# Patient Record
Sex: Female | Born: 1960 | Race: White | Hispanic: No | Marital: Married | Smoking: Former smoker
Health system: Southern US, Community
[De-identification: ages and names within clinical notes are randomized; demographics above are authoritative.]

## PROBLEM LIST (undated history)

## (undated) DIAGNOSIS — M545 Low back pain, unspecified: Secondary | ICD-10-CM

## (undated) DIAGNOSIS — M1990 Unspecified osteoarthritis, unspecified site: Secondary | ICD-10-CM

## (undated) DIAGNOSIS — F329 Major depressive disorder, single episode, unspecified: Secondary | ICD-10-CM

## (undated) DIAGNOSIS — Z9889 Other specified postprocedural states: Secondary | ICD-10-CM

## (undated) DIAGNOSIS — M797 Fibromyalgia: Secondary | ICD-10-CM

## (undated) DIAGNOSIS — K760 Fatty (change of) liver, not elsewhere classified: Secondary | ICD-10-CM

## (undated) DIAGNOSIS — R112 Nausea with vomiting, unspecified: Secondary | ICD-10-CM

## (undated) DIAGNOSIS — G5600 Carpal tunnel syndrome, unspecified upper limb: Secondary | ICD-10-CM

## (undated) DIAGNOSIS — M2550 Pain in unspecified joint: Secondary | ICD-10-CM

## (undated) DIAGNOSIS — F32A Depression, unspecified: Secondary | ICD-10-CM

## (undated) DIAGNOSIS — K219 Gastro-esophageal reflux disease without esophagitis: Secondary | ICD-10-CM

## (undated) HISTORY — PX: SHOULDER SURGERY: SHX246

## (undated) HISTORY — DX: Low back pain, unspecified: M54.50

## (undated) HISTORY — DX: Unspecified osteoarthritis, unspecified site: M19.90

## (undated) HISTORY — DX: Pain in unspecified joint: M25.50

## (undated) HISTORY — DX: Fatty (change of) liver, not elsewhere classified: K76.0

## (undated) HISTORY — DX: Gastro-esophageal reflux disease without esophagitis: K21.9

## (undated) HISTORY — DX: Carpal tunnel syndrome, unspecified upper limb: G56.00

---

## 1983-12-09 HISTORY — PX: TUBAL LIGATION: SHX77

## 2002-12-08 HISTORY — PX: BUNIONECTOMY: SHX129

## 2005-11-10 ENCOUNTER — Encounter: Admission: RE | Admit: 2005-11-10 | Discharge: 2005-11-10 | Payer: Self-pay | Admitting: Rheumatology

## 2008-05-19 ENCOUNTER — Encounter: Admission: RE | Admit: 2008-05-19 | Discharge: 2008-05-19 | Payer: Self-pay | Admitting: Surgery

## 2009-07-01 ENCOUNTER — Emergency Department (HOSPITAL_BASED_OUTPATIENT_CLINIC_OR_DEPARTMENT_OTHER): Admission: EM | Admit: 2009-07-01 | Discharge: 2009-07-01 | Payer: Self-pay | Admitting: Emergency Medicine

## 2010-12-08 HISTORY — PX: TARSAL TUNNEL RELEASE: SUR1099

## 2011-03-16 LAB — URINALYSIS, ROUTINE W REFLEX MICROSCOPIC
Bilirubin Urine: NEGATIVE
Glucose, UA: NEGATIVE mg/dL
Ketones, ur: NEGATIVE mg/dL
Nitrite: NEGATIVE
Protein, ur: NEGATIVE mg/dL
Specific Gravity, Urine: 1.008 (ref 1.005–1.030)
Urobilinogen, UA: 0.2 mg/dL (ref 0.0–1.0)
pH: 5.5 (ref 5.0–8.0)

## 2011-03-16 LAB — URINE CULTURE: Colony Count: >=100000

## 2011-03-16 LAB — URINE MICROSCOPIC-ADD ON

## 2011-03-16 LAB — PREGNANCY, URINE: Preg Test, Ur: NEGATIVE

## 2011-11-06 ENCOUNTER — Other Ambulatory Visit (HOSPITAL_BASED_OUTPATIENT_CLINIC_OR_DEPARTMENT_OTHER): Payer: Self-pay | Admitting: Family Medicine

## 2011-11-06 DIAGNOSIS — R109 Unspecified abdominal pain: Secondary | ICD-10-CM

## 2011-11-07 ENCOUNTER — Other Ambulatory Visit (HOSPITAL_BASED_OUTPATIENT_CLINIC_OR_DEPARTMENT_OTHER): Payer: Self-pay

## 2011-11-07 ENCOUNTER — Inpatient Hospital Stay (HOSPITAL_COMMUNITY): Admission: RE | Admit: 2011-11-07 | Payer: Self-pay | Source: Ambulatory Visit

## 2011-11-12 ENCOUNTER — Ambulatory Visit (HOSPITAL_COMMUNITY)
Admission: RE | Admit: 2011-11-12 | Discharge: 2011-11-12 | Disposition: A | Discharge status: Home / Self Care | Payer: 59 | Source: Ambulatory Visit | Attending: Family Medicine | Admitting: Family Medicine

## 2011-11-12 DIAGNOSIS — K7689 Other specified diseases of liver: Secondary | ICD-10-CM | POA: Insufficient documentation

## 2011-11-12 DIAGNOSIS — R109 Unspecified abdominal pain: Secondary | ICD-10-CM | POA: Insufficient documentation

## 2013-07-26 ENCOUNTER — Other Ambulatory Visit (HOSPITAL_BASED_OUTPATIENT_CLINIC_OR_DEPARTMENT_OTHER): Payer: Self-pay | Admitting: Family Medicine

## 2013-07-26 DIAGNOSIS — M545 Low back pain, unspecified: Secondary | ICD-10-CM

## 2013-07-30 ENCOUNTER — Other Ambulatory Visit (HOSPITAL_BASED_OUTPATIENT_CLINIC_OR_DEPARTMENT_OTHER): Payer: 59

## 2013-08-06 ENCOUNTER — Ambulatory Visit (HOSPITAL_BASED_OUTPATIENT_CLINIC_OR_DEPARTMENT_OTHER): Payer: 59

## 2013-08-09 ENCOUNTER — Ambulatory Visit (HOSPITAL_BASED_OUTPATIENT_CLINIC_OR_DEPARTMENT_OTHER)
Admission: RE | Admit: 2013-08-09 | Discharge: 2013-08-09 | Disposition: A | Discharge status: Home / Self Care | Payer: 59 | Source: Ambulatory Visit | Attending: Family Medicine | Admitting: Family Medicine

## 2013-08-09 ENCOUNTER — Other Ambulatory Visit (HOSPITAL_BASED_OUTPATIENT_CLINIC_OR_DEPARTMENT_OTHER): Payer: 59

## 2013-08-09 DIAGNOSIS — M899 Disorder of bone, unspecified: Secondary | ICD-10-CM | POA: Insufficient documentation

## 2013-08-09 DIAGNOSIS — IMO0002 Reserved for concepts with insufficient information to code with codable children: Secondary | ICD-10-CM | POA: Insufficient documentation

## 2013-08-09 DIAGNOSIS — M5137 Other intervertebral disc degeneration, lumbosacral region: Secondary | ICD-10-CM | POA: Insufficient documentation

## 2013-08-09 DIAGNOSIS — G8929 Other chronic pain: Secondary | ICD-10-CM | POA: Insufficient documentation

## 2013-08-09 DIAGNOSIS — M545 Low back pain, unspecified: Secondary | ICD-10-CM | POA: Insufficient documentation

## 2013-08-09 DIAGNOSIS — M51379 Other intervertebral disc degeneration, lumbosacral region without mention of lumbar back pain or lower extremity pain: Secondary | ICD-10-CM | POA: Insufficient documentation

## 2013-08-09 DIAGNOSIS — M5146 Schmorl's nodes, lumbar region: Secondary | ICD-10-CM | POA: Insufficient documentation

## 2013-08-09 DIAGNOSIS — M5126 Other intervertebral disc displacement, lumbar region: Secondary | ICD-10-CM | POA: Insufficient documentation

## 2013-10-05 ENCOUNTER — Other Ambulatory Visit (HOSPITAL_COMMUNITY): Payer: Self-pay | Admitting: Family Medicine

## 2013-10-05 DIAGNOSIS — Z1231 Encounter for screening mammogram for malignant neoplasm of breast: Secondary | ICD-10-CM

## 2013-10-25 ENCOUNTER — Ambulatory Visit (HOSPITAL_COMMUNITY): Payer: 59

## 2013-11-22 ENCOUNTER — Ambulatory Visit (HOSPITAL_COMMUNITY): Payer: 59

## 2013-12-13 ENCOUNTER — Ambulatory Visit (HOSPITAL_COMMUNITY)
Admission: RE | Admit: 2013-12-13 | Discharge: 2013-12-13 | Disposition: A | Discharge status: Home / Self Care | Payer: 59 | Source: Ambulatory Visit | Attending: Family Medicine | Admitting: Family Medicine

## 2013-12-13 DIAGNOSIS — Z1231 Encounter for screening mammogram for malignant neoplasm of breast: Secondary | ICD-10-CM | POA: Insufficient documentation

## 2014-02-21 ENCOUNTER — Other Ambulatory Visit (HOSPITAL_BASED_OUTPATIENT_CLINIC_OR_DEPARTMENT_OTHER): Payer: Self-pay | Admitting: Neurosurgery

## 2014-02-21 DIAGNOSIS — M549 Dorsalgia, unspecified: Secondary | ICD-10-CM

## 2014-02-25 ENCOUNTER — Ambulatory Visit (HOSPITAL_BASED_OUTPATIENT_CLINIC_OR_DEPARTMENT_OTHER)
Admission: RE | Admit: 2014-02-25 | Discharge: 2014-02-25 | Disposition: A | Discharge status: Home / Self Care | Payer: 59 | Source: Ambulatory Visit | Attending: Neurosurgery | Admitting: Neurosurgery

## 2014-02-25 DIAGNOSIS — M549 Dorsalgia, unspecified: Secondary | ICD-10-CM | POA: Insufficient documentation

## 2014-03-27 ENCOUNTER — Ambulatory Visit: Payer: Self-pay

## 2014-04-14 ENCOUNTER — Ambulatory Visit (INDEPENDENT_AMBULATORY_CARE_PROVIDER_SITE_OTHER): Payer: 59

## 2014-04-14 VITALS — BP 112/70 | HR 53 | Resp 16 | Ht 65.0 in | Wt 190.0 lb

## 2014-04-14 DIAGNOSIS — D492 Neoplasm of unspecified behavior of bone, soft tissue, and skin: Secondary | ICD-10-CM

## 2014-04-14 DIAGNOSIS — B070 Plantar wart: Secondary | ICD-10-CM

## 2014-04-14 DIAGNOSIS — M79609 Pain in unspecified limb: Secondary | ICD-10-CM

## 2014-04-14 NOTE — Patient Instructions (Signed)

## 2014-04-14 NOTE — Progress Notes (Signed)
   Subjective:    Patient ID: Priscilla Underwood, female    DOB: 1961/01/06, 53 y.o.   MRN: [id number]  HPI Comments: "I have this wart that I want removed"  Patient c/o painful callused area plantar forefoot for about 1 year. Dr. Blenda Mounts at last visit recommended salicylic acid to treat. Tried but no help.   Pathology specimen sent to [facility]  Foot Pain      Review of Systems  Musculoskeletal: Positive for gait problem.  All other systems reviewed and are negative.      Objective:   Physical Exam Neurovascular status is intact pedal pulses palpable epicritic and proprioceptive sensations intact and symmetric bilateral is normal plantar response and DTRs. Dermatologically his [place] keratotic lesion subsecond MTP area of the right foot painful tender symptomatic is also keratoses fifth digit MTP area left foot although this is more diffuse tyloma. No other new problems or difficulties pedal pulses are palpable epicritic and proprioceptive sensations unremarkable at this time patient K. for progressive options per her request my recommendation excision curettage this lesion and biopsy for pathology or evaluation. At this time local anesthetic block is administered total of 2 cc 50-50 mixture of 2% Xylocaine with epinephrine 1 100,000 and 0.5% Marcaine plain is infiltrated Betadine prep performed the lesion is circumscribing thready from the side and submitted for formalin for pathology analysis. At this time the bases phenolized with single application of phenyl 5 alcohol wash Betadine ointment and a gauze dressing being applied. Initiate daily cleansing with antibacterial soap and warm water or Betadine warm water Neosporin and Band-Aid dressing. Recommended Tylenol or Advil as needed for pain       Assessment & Plan:  Assessment verruca plantaris plantar right foot subsecond MTP area excision for biopsy is carried out at this time we'll initiate daily cleansing with Betadine warm water  Neosporin and Band-Aid dressing Tylenol as needed for pain recheck in 2 weeks for an followup and reevaluation next  Harriet Masson DPM

## 2014-04-17 ENCOUNTER — Telehealth: Payer: Self-pay | Admitting: *Deleted

## 2014-04-17 NOTE — Telephone Encounter (Signed)
Skin Wedge 2nd MTP right foot sent to [facility] to rule out Verruca.  [facility] [address] [place] [address] Ph:  [phone]

## 2014-05-19 ENCOUNTER — Ambulatory Visit (INDEPENDENT_AMBULATORY_CARE_PROVIDER_SITE_OTHER): Payer: 59

## 2014-05-19 VITALS — BP 125/72 | HR 52 | Resp 14 | Ht 65.0 in | Wt 192.0 lb

## 2014-05-19 DIAGNOSIS — Q828 Other specified congenital malformations of skin: Secondary | ICD-10-CM

## 2014-05-19 DIAGNOSIS — B070 Plantar wart: Secondary | ICD-10-CM

## 2014-05-19 DIAGNOSIS — M79609 Pain in unspecified limb: Secondary | ICD-10-CM

## 2014-05-19 NOTE — Patient Instructions (Signed)
Corns and Calluses A thickening of the skin layer (usually over bony areas, such as toe joints) is known as a corn. Two types of corns exist: hard corns and soft corns. Calluses are painless areas of skin thickening that are caused by repeated pressure or irritation. Corns tend to affect toe joints and the skin between the toes; whereas, a callus can appear on any part of the body (especially the hands, feet, or knees).  SYMPTOMS   Corn:  Presence of a small (1/8 to 3/8 inch [3 to 10 mm in diameter]), painful bump on the side or over the joint of a toe.  Hard corns are more common on the outer portion of the little (fifth) toe at the joint.  Soft corns are more common between bony bumps (prominences), usually between the fourth and fifth toes or between the second and third toes.  Callus:  A rough, thickened area of skin that appears after repeated pressure or irritation. CAUSES  The purpose of corns and calluses is to protect an area of skin from injury caused by repeated irritation (rubbing or squeezing). The presence of pressure causes the skin cells to grow at a faster rate than the cells of unaffected areas. This leads to an overgrowth (corn or callus). As apposed to hard corns, soft corns tend to develop between toes, because there is more moisture. Soft corns are often the result of prolonged shoe wear, which leads to increased perspiration and moisture.  RISK INCREASES WITH:  Shoes that are too tight.  Occupations or sports that involve repetitive pressure on the hands (racquetball and baseball) or sudden stops on hard surfaces (track and tennis).  Sports that require the athlete to wear shoes, perspire, or wear clothing or protective gear that causes the production of heat and friction. PREVENTION  Properly fitted shoes and equipment.  Modify activities to prevent constant pressure on specific areas of skin.  If possible, wear padding over areas of skin that are exposed to  repeated pressure or irritation.  Keep the area between the toes dry (with powder or by removing shoes often).  Relieve shoe pressure by stretching the areas of the shoe that cause the pressure and or use ointments to soften leather shoes. PROGNOSIS  Corns and calluses typically subside if the activity that causes them is eliminated. Recovery may take up to 3 weeks. Recurrence is likely even with treatment if the cause is not removed.  RELATED COMPLICATIONS  If one overcompensates in an attempt to avoid pain, he or she may experience pain in other areas due to the changes in body movements (mechanics). TREATMENT  The best way to treat corns and calluses is to remove the source of pressure. Corn and callus pads may be helpful in reducing pressure on the affected skin. For soft corns, try to keep the affected area dry. If you cannot find shoes that fit properly, a shoe repair shop may be able to alter your shoes to reduce pressure. Occasionally a cushion for the bottom of the foot (metatarsal bar) worn within the shoe may relieve pressure on corns or calluses of the foot. For calluses, you may be able to peel or rub the thickened area with a pumice stone, sandstone, callus file, or with sandpaper to remove the callus; wetting the affected area may make this process more effective. Do not cut the corn or callus with a razor or knife. If the corn or callus must be removed, then a medically trained person should perform   the procedure. After peeling away the upper layers of a corn once or twice a day, it may be recommended to apply a non-prescription 5% to 10% salicylic ointment and cover the area with a bandage. It very uncommon to have the bony bumps (at toe joints) surgically removed. MEDICATION   If pain medication is necessary, nonsteroidal anti-inflammatory medications, such as aspirin and ibuprofen, or other minor pain relievers, such as acetaminophen, are often recommended. Contact your caregiver  immediately if any bleeding, stomach upset, or signs of an allergic reaction occur.  Topical salicylic ointments (5% to 10%) may be of benefit.  Prescription pain medications may be given by a caregiver. Use only as directed and only as much as you need.  Soak the foot for 20 minutes, twice a day, in a gallon of warm water. This may help to soften corns and calluses. Care should be taken to thoroughly dry the foot, especially between the toes, after soaking. SEEK MEDICAL CARE IF:   Symptoms get worse or do not improve in 2 weeks despite treatment.  Any signs of infection develop, including redness, swelling, increased pain or tenderness, or increased warmth around the corn or callus.  New, unexplained symptoms develop (drugs used in treatment may produce side effects). Document Released: 11/24/2005 Document Revised: 02/16/2012 Document Reviewed: 03/08/2009 [employer] Patient Information 2014 [employer].  

## 2014-05-19 NOTE — Progress Notes (Signed)
   Subjective:    Patient ID: Priscilla Underwood, female    DOB: Feb 03, 1961, 53 y.o.   MRN: [id number]  HPI Comments: Pt states she feels the right foot surgical site is better, the left 5th MPJ lateral is still sore and she feels it may have a wart as well.  I did see a few black speckles in the right plantar 2nd MPJ callous area.     Review of Systems no new findings of systems changes     Objective:   Physical Exam Patient is proxy 1 month status post wart excision subsecond MTP area of right foot and painful and symptomatic and she drainage she soak it for about 2 weeks since that time there is an eschar and keratoses which is still in place this time a debridement way the eschar a keratoses the wound is nearly completely healed although they're still about 2 mm Or a small fissure suggested continue Neosporin and Band-Aid for couple more days to complete resolution of the recent still painful she has been walking on the scar currently eschar tissue creating irritation to the site this was posterior been debrided off after 2 weeks but was not. At this time debris the eschar tissue and keratoses also examined the lateral fifth MTP area of the left foot where there is a keratotic lesion posse associated irritation to do a bony prominence there is no sign of verruca on the left foot porokeratosis is debrided away and patient is advised using lotion or cream and a pumice stone on a regular basis the future       Assessment & Plan:  Assessment this time is resolved are healing right foot status post curettage excision of verruca of that wound is healing the eschar tissue is debrided away patient also has a new lesion on the lateral fifth MTP area left foot which is examined noted to be poor keratoses lesions debrided however patient is given instruction in the skin care corn callus utilizing pumice stone and lotion a daily basis reappointed if needed if problems recur or persist beyond  month.  Harriet Masson DPM

## 2014-06-23 ENCOUNTER — Ambulatory Visit: Payer: 59

## 2014-07-05 ENCOUNTER — Ambulatory Visit: Payer: 59

## 2014-12-11 ENCOUNTER — Other Ambulatory Visit (HOSPITAL_COMMUNITY): Payer: Self-pay | Admitting: Family Medicine

## 2014-12-11 DIAGNOSIS — Z1231 Encounter for screening mammogram for malignant neoplasm of breast: Secondary | ICD-10-CM

## 2014-12-19 ENCOUNTER — Ambulatory Visit: Payer: 59

## 2014-12-29 ENCOUNTER — Ambulatory Visit (HOSPITAL_COMMUNITY): Payer: 59

## 2015-01-09 ENCOUNTER — Ambulatory Visit (HOSPITAL_COMMUNITY)
Admission: RE | Admit: 2015-01-09 | Discharge: 2015-01-09 | Disposition: A | Discharge status: Home / Self Care | Payer: 59 | Source: Ambulatory Visit | Attending: Family Medicine | Admitting: Family Medicine

## 2015-01-09 DIAGNOSIS — Z1231 Encounter for screening mammogram for malignant neoplasm of breast: Secondary | ICD-10-CM | POA: Diagnosis not present

## 2015-01-30 ENCOUNTER — Ambulatory Visit (INDEPENDENT_AMBULATORY_CARE_PROVIDER_SITE_OTHER): Payer: 59

## 2015-01-30 ENCOUNTER — Ambulatory Visit: Payer: Self-pay

## 2015-01-30 VITALS — BP 127/81 | HR 52 | Resp 16

## 2015-01-30 DIAGNOSIS — M7752 Other enthesopathy of left foot: Secondary | ICD-10-CM

## 2015-01-30 DIAGNOSIS — Q828 Other specified congenital malformations of skin: Secondary | ICD-10-CM

## 2015-01-30 DIAGNOSIS — M79673 Pain in unspecified foot: Secondary | ICD-10-CM

## 2015-01-30 DIAGNOSIS — B070 Plantar wart: Secondary | ICD-10-CM

## 2015-01-30 DIAGNOSIS — M159 Polyosteoarthritis, unspecified: Secondary | ICD-10-CM

## 2015-01-30 DIAGNOSIS — M778 Other enthesopathies, not elsewhere classified: Secondary | ICD-10-CM

## 2015-01-30 DIAGNOSIS — M779 Enthesopathy, unspecified: Secondary | ICD-10-CM

## 2015-01-30 DIAGNOSIS — R52 Pain, unspecified: Secondary | ICD-10-CM

## 2015-01-30 NOTE — Patient Instructions (Signed)
Corns and Calluses Corns are small areas of thickened skin that usually occur on the top, sides, or tip of a toe. They contain a cone-shaped core with a point that can press on a nerve below. This causes pain. Calluses are areas of thickened skin that usually develop on hands, fingers, palms, soles of the feet, and heels. These are areas that experience frequent friction or pressure. CAUSES  Corns are usually the result of rubbing (friction) or pressure from shoes that are too tight or do not fit properly. Calluses are caused by repeated friction and pressure on the affected areas. SYMPTOMS  A hard growth on the skin.  Pain or tenderness under the skin.  Sometimes, redness and swelling.  Increased discomfort while wearing tight-fitting shoes. DIAGNOSIS  Your caregiver can usually tell what the problem is by doing a physical exam. TREATMENT  Removing the cause of the friction or pressure is usually the only treatment needed. However, sometimes medicines can be used to help soften the hardened, thickened areas. These medicines include salicylic acid plasters and 12% ammonium lactate lotion. These medicines should only be used under the direction of your caregiver. HOME CARE INSTRUCTIONS   Try to remove pressure from the affected area.  You may wear donut-shaped corn pads to protect your skin.  You may use a pumice stone or nonmetallic nail file to gently reduce the thickness of a corn.  Wear properly fitted footwear.  If you have calluses on the hands, wear gloves during activities that cause friction.  If you have diabetes, you should regularly examine your feet. Tell your caregiver if you notice any problems with your feet. SEEK IMMEDIATE MEDICAL CARE IF:   You have increased pain, swelling, redness, or warmth in the affected area.  Your corn or callus starts to drain fluid or bleeds.  You are not getting better, even with treatment. Document Released: 08/30/2004 Document  Revised: 02/16/2012 Document Reviewed: 07/22/2011 [facility] Patient Information 2015 [place], [place]. This information is not intended to replace advice given to you by your health care provider. Make sure you discuss any questions you have with your health care provider.      Consider using Eucerin cream or generic for Eucerin which can be purchased at [employer].  Recommend replacing athletic shoes every 6-12 months  Consider using a cream or lotion with a urea base

## 2015-01-30 NOTE — Progress Notes (Signed)
   Subjective:    Patient ID: Priscilla Underwood, female    DOB: Nov 29, 1961, 54 y.o.   MRN: [id number]  HPI Pt presents with painful right foot callus/wart, and left foot pain on dorsal side, achey and swelling in left foot   Review of Systems no new findings or systemic changes noted     Objective:   Physical Exam Vascular status is intact pedal pulses are palpable epicritic and proprioceptive sensations intact patient has a keratotic lesion or hypertrophic scar poor keratoses subsecond MTP area right knees debriding at this time. We'll located circumscribed however does not appear to be verrucoid in nature. The other complaint at this time his new patient is having pain over the dorsum of the left foot mid foot Lisfranc's area this may correlate with previous arthropathy of Lisfranc's joint and subluxation Lisfranc's fourth fifth metatarsal base and cuboid. X-rays demonstrate some subluxation Lisfranc's joint patient is also history of fibromyalgia and mild osteoarthropathy. Arthritis was exacerbated by starting walking on a treadmill however wore a pair of  Athletic shoes that are more than 56 years old. Again pedal pulses are palpable no other new changes or findings noted      Assessment & Plan:  Assessment right foot or keratoses hypertrophic scar tissue plantar subsecond MTP R Wright the lesion is debrided recommended urea cream at this time lumicain and Neosporin applied following debridement. Stone urea cream regularly.  On the left foot x-rays confirm no fracture subluxation Lisfranc's noted cannot rule out Oster arthropathy her for rheumatologic chronic pain-like symptomology however may is just develops generalized osteoarthropathy capsulitis secondary to lack of support are worn out athletic shoe on use on the treadmill. Reappoint on an as-needed basis for future follow-up if there is any further exacerbation or failure to improve  Harriet Masson DPM

## 2015-05-03 ENCOUNTER — Other Ambulatory Visit: Payer: Self-pay | Admitting: Orthopedic Surgery

## 2015-05-18 ENCOUNTER — Encounter: Payer: Self-pay | Admitting: Podiatry

## 2015-05-18 ENCOUNTER — Ambulatory Visit (INDEPENDENT_AMBULATORY_CARE_PROVIDER_SITE_OTHER): Payer: 59

## 2015-05-18 ENCOUNTER — Ambulatory Visit (INDEPENDENT_AMBULATORY_CARE_PROVIDER_SITE_OTHER): Payer: 59 | Admitting: Podiatry

## 2015-05-18 DIAGNOSIS — M779 Enthesopathy, unspecified: Secondary | ICD-10-CM

## 2015-05-18 DIAGNOSIS — R52 Pain, unspecified: Secondary | ICD-10-CM

## 2015-05-18 DIAGNOSIS — M778 Other enthesopathies, not elsewhere classified: Secondary | ICD-10-CM

## 2015-05-18 DIAGNOSIS — B070 Plantar wart: Secondary | ICD-10-CM | POA: Diagnosis not present

## 2015-05-18 DIAGNOSIS — M7752 Other enthesopathy of left foot: Secondary | ICD-10-CM

## 2015-05-21 NOTE — Progress Notes (Signed)
Subjective:     Patient ID: Priscilla Underwood, female   DOB: [birth year], 54 y.o.   MRN: [id number]  HPI patient presents stating I have a lot of pain underneath my left foot with these lesions and I'm having trouble walking on them   Review of Systems     Objective:   Physical Exam Neurovascular status intact muscle strength adequate with keratotic lesions plantar left that upon debridement show pinpoint bleeding and pain to lateral pressure    Assessment:     Verruca plantaris plantar left along with probable inflammation and fluid buildup around the second MPJ with capsulitis    Plan:     H&P and both conditions reviewed. Debridement accomplished today and I did apply a chemical agent to the area to try to kill any verruca material and applied padding. Gave instructions what to do at home and reappoint to recheck and discussed rigid bottom shoes for the inflammatory capsulitis

## 2015-06-08 ENCOUNTER — Encounter (HOSPITAL_BASED_OUTPATIENT_CLINIC_OR_DEPARTMENT_OTHER): Payer: Self-pay | Admitting: *Deleted

## 2015-06-15 ENCOUNTER — Ambulatory Visit (HOSPITAL_BASED_OUTPATIENT_CLINIC_OR_DEPARTMENT_OTHER): Payer: 59 | Admitting: Certified Registered"

## 2015-06-15 ENCOUNTER — Encounter (HOSPITAL_BASED_OUTPATIENT_CLINIC_OR_DEPARTMENT_OTHER)
Admission: RE | Disposition: A | Discharge status: Home / Self Care | Payer: Self-pay | Source: Ambulatory Visit | Attending: Orthopedic Surgery

## 2015-06-15 ENCOUNTER — Encounter (HOSPITAL_BASED_OUTPATIENT_CLINIC_OR_DEPARTMENT_OTHER): Payer: Self-pay | Admitting: *Deleted

## 2015-06-15 ENCOUNTER — Ambulatory Visit (HOSPITAL_BASED_OUTPATIENT_CLINIC_OR_DEPARTMENT_OTHER)
Admission: RE | Admit: 2015-06-15 | Discharge: 2015-06-15 | Disposition: A | Discharge status: Home / Self Care | Payer: 59 | Source: Ambulatory Visit | Attending: Orthopedic Surgery | Admitting: Orthopedic Surgery

## 2015-06-15 DIAGNOSIS — K219 Gastro-esophageal reflux disease without esophagitis: Secondary | ICD-10-CM | POA: Diagnosis not present

## 2015-06-15 DIAGNOSIS — Z6831 Body mass index (BMI) 31.0-31.9, adult: Secondary | ICD-10-CM | POA: Diagnosis not present

## 2015-06-15 DIAGNOSIS — Z79899 Other long term (current) drug therapy: Secondary | ICD-10-CM | POA: Diagnosis not present

## 2015-06-15 DIAGNOSIS — M1990 Unspecified osteoarthritis, unspecified site: Secondary | ICD-10-CM | POA: Diagnosis not present

## 2015-06-15 DIAGNOSIS — G5602 Carpal tunnel syndrome, left upper limb: Secondary | ICD-10-CM | POA: Diagnosis not present

## 2015-06-15 DIAGNOSIS — Z791 Long term (current) use of non-steroidal anti-inflammatories (NSAID): Secondary | ICD-10-CM | POA: Diagnosis not present

## 2015-06-15 DIAGNOSIS — F329 Major depressive disorder, single episode, unspecified: Secondary | ICD-10-CM | POA: Insufficient documentation

## 2015-06-15 HISTORY — PX: CARPAL TUNNEL RELEASE: SHX101

## 2015-06-15 HISTORY — DX: Depression, unspecified: F32.A

## 2015-06-15 HISTORY — DX: Major depressive disorder, single episode, unspecified: F32.9

## 2015-06-15 SURGERY — CARPAL TUNNEL RELEASE
Anesthesia: Monitor Anesthesia Care | Site: Wrist | Laterality: Left

## 2015-06-15 MED ORDER — BUPIVACAINE HCL (PF) 0.25 % IJ SOLN
INTRAMUSCULAR | Status: AC
  Filled 2015-06-15: qty 60

## 2015-06-15 MED ORDER — HYDROCODONE-ACETAMINOPHEN 7.5-325 MG PO TABS: 1.0000 | ORAL_TABLET | Freq: Once | ORAL | Status: DC | PRN

## 2015-06-15 MED ORDER — CHLORHEXIDINE GLUCONATE 4 % EX LIQD: 60.0000 mL | Freq: Once | CUTANEOUS | Status: DC

## 2015-06-15 MED ORDER — HYDROMORPHONE HCL 1 MG/ML IJ SOLN: 0.2500 mg | INTRAMUSCULAR | Status: DC | PRN

## 2015-06-15 MED ORDER — CEFAZOLIN SODIUM-DEXTROSE 2-3 GM-% IV SOLR
INTRAVENOUS | Status: AC
  Filled 2015-06-15: qty 50

## 2015-06-15 MED ORDER — FENTANYL CITRATE (PF) 100 MCG/2ML IJ SOLN
INTRAMUSCULAR | Status: AC
  Filled 2015-06-15: qty 4

## 2015-06-15 MED ORDER — CEFAZOLIN SODIUM-DEXTROSE 2-3 GM-% IV SOLR: 2.0000 g | INTRAVENOUS | Status: DC

## 2015-06-15 MED ORDER — LIDOCAINE HCL (PF) 1 % IJ SOLN
INTRAMUSCULAR | Status: AC
  Filled 2015-06-15: qty 60

## 2015-06-15 MED ORDER — SCOPOLAMINE 1 MG/3DAYS TD PT72: 1.0000 | MEDICATED_PATCH | Freq: Once | TRANSDERMAL | Status: DC | PRN

## 2015-06-15 MED ORDER — PROMETHAZINE HCL 25 MG/ML IJ SOLN: 6.2500 mg | INTRAMUSCULAR | Status: DC | PRN

## 2015-06-15 MED ORDER — PROPOFOL 500 MG/50ML IV EMUL
INTRAVENOUS | Status: AC
  Filled 2015-06-15: qty 50

## 2015-06-15 MED ORDER — SODIUM BICARBONATE 4 % IV SOLN
INTRAVENOUS | Status: AC
  Filled 2015-06-15: qty 10

## 2015-06-15 MED ORDER — ONDANSETRON HCL 4 MG/2ML IJ SOLN
INTRAMUSCULAR | Status: DC | PRN
  Administered 2015-06-15: 4 mg via INTRAVENOUS

## 2015-06-15 MED ORDER — MIDAZOLAM HCL 2 MG/2ML IJ SOLN
1.0000 mg | INTRAMUSCULAR | Status: DC | PRN
  Administered 2015-06-15 (×2): 1 mg via INTRAVENOUS

## 2015-06-15 MED ORDER — GLYCOPYRROLATE 0.2 MG/ML IJ SOLN: 0.2000 mg | Freq: Once | INTRAMUSCULAR | Status: DC | PRN

## 2015-06-15 MED ORDER — CEFAZOLIN SODIUM-DEXTROSE 2-3 GM-% IV SOLR
INTRAVENOUS | Status: DC | PRN
  Administered 2015-06-15: 2 g via INTRAVENOUS

## 2015-06-15 MED ORDER — SODIUM BICARBONATE 4 % IV SOLN
INTRAVENOUS | Status: DC | PRN
  Administered 2015-06-15: 20 mL via INTRAMUSCULAR

## 2015-06-15 MED ORDER — KETOROLAC TROMETHAMINE 30 MG/ML IJ SOLN: 30.0000 mg | Freq: Once | INTRAMUSCULAR | Status: DC | PRN

## 2015-06-15 MED ORDER — HYDROCODONE-ACETAMINOPHEN 5-325 MG PO TABS: 2.0000 | ORAL_TABLET | Freq: Four times a day (QID) | ORAL | Status: DC | PRN

## 2015-06-15 MED ORDER — LACTATED RINGERS IV SOLN
INTRAVENOUS | Status: DC
  Administered 2015-06-15: 07:00:00 via INTRAVENOUS

## 2015-06-15 MED ORDER — MIDAZOLAM HCL 2 MG/2ML IJ SOLN
INTRAMUSCULAR | Status: AC
  Filled 2015-06-15: qty 2

## 2015-06-15 MED ORDER — FENTANYL CITRATE (PF) 100 MCG/2ML IJ SOLN
50.0000 ug | INTRAMUSCULAR | Status: DC | PRN
  Administered 2015-06-15: 50 ug via INTRAVENOUS

## 2015-06-15 SURGICAL SUPPLY — 49 items
BANDAGE ELASTIC 3 VELCRO ST LF (GAUZE/BANDAGES/DRESSINGS) ×2 IMPLANT
BLADE CARPAL TUNNEL SNGL USE (BLADE) ×2 IMPLANT
BLADE SURG 15 STRL LF DISP TIS (BLADE) ×2 IMPLANT
BLADE SURG 15 STRL SS (BLADE) ×4
BNDG CONFORM 3 STRL LF (GAUZE/BANDAGES/DRESSINGS) ×2 IMPLANT
BRUSH SCRUB EZ PLAIN DRY (MISCELLANEOUS) ×2 IMPLANT
CORDS BIPOLAR (ELECTRODE) ×2 IMPLANT
COVER BACK TABLE 60X90IN (DRAPES) ×2 IMPLANT
CUFF TOURNIQUET SINGLE 18IN (TOURNIQUET CUFF) IMPLANT
DRAIN PENROSE 1/4X12 LTX STRL (WOUND CARE) IMPLANT
DRAPE EXTREMITY T 121X128X90 (DRAPE) ×2 IMPLANT
DRAPE SURG 17X23 STRL (DRAPES) ×2 IMPLANT
DRSG EMULSION OIL 3X3 NADH (GAUZE/BANDAGES/DRESSINGS) ×2 IMPLANT
GAUZE SPONGE 4X4 12PLY STRL (GAUZE/BANDAGES/DRESSINGS) ×1 IMPLANT
GAUZE SPONGE 4X4 16PLY XRAY LF (GAUZE/BANDAGES/DRESSINGS) IMPLANT
GAUZE XEROFORM 1X8 LF (GAUZE/BANDAGES/DRESSINGS) ×2 IMPLANT
GLOVE BIOGEL M STRL SZ7.5 (GLOVE) IMPLANT
GLOVE BIOGEL PI IND STRL 7.0 (GLOVE) IMPLANT
GLOVE BIOGEL PI INDICATOR 7.0 (GLOVE) ×3
GLOVE ECLIPSE 6.5 STRL STRAW (GLOVE) ×1 IMPLANT
GLOVE SS BIOGEL STRL SZ 8 (GLOVE) ×1 IMPLANT
GLOVE SUPERSENSE BIOGEL SZ 8 (GLOVE) ×1
GLOVE SURG SS PI 7.5 STRL IVOR (GLOVE) ×1 IMPLANT
GOWN STRL REUS W/ TWL LRG LVL3 (GOWN DISPOSABLE) ×1 IMPLANT
GOWN STRL REUS W/ TWL XL LVL3 (GOWN DISPOSABLE) ×1 IMPLANT
GOWN STRL REUS W/TWL LRG LVL3 (GOWN DISPOSABLE) ×2
GOWN STRL REUS W/TWL XL LVL3 (GOWN DISPOSABLE) ×2
LOOP VESSEL MAXI BLUE (MISCELLANEOUS) IMPLANT
NDL HYPO 25X1 1.5 SAFETY (NEEDLE) ×2 IMPLANT
NDL SAFETY ECLIPSE 18X1.5 (NEEDLE) ×1 IMPLANT
NEEDLE HYPO 18GX1.5 SHARP (NEEDLE) ×2
NEEDLE HYPO 22GX1.5 SAFETY (NEEDLE) IMPLANT
NEEDLE HYPO 25X1 1.5 SAFETY (NEEDLE) ×4 IMPLANT
NS IRRIG 1000ML POUR BTL (IV SOLUTION) ×2 IMPLANT
PACK BASIN DAY SURGERY FS (CUSTOM PROCEDURE TRAY) ×2 IMPLANT
PAD ALCOHOL SWAB (MISCELLANEOUS) ×16 IMPLANT
PAD CAST 3X4 CTTN HI CHSV (CAST SUPPLIES) ×2 IMPLANT
PADDING CAST ABS 4INX4YD NS (CAST SUPPLIES) ×1
PADDING CAST ABS COTTON 4X4 ST (CAST SUPPLIES) ×1 IMPLANT
PADDING CAST COTTON 3X4 STRL (CAST SUPPLIES) ×4
SHEET MEDIUM DRAPE 40X70 STRL (DRAPES) ×2 IMPLANT
STOCKINETTE 4X48 STRL (DRAPES) ×2 IMPLANT
SUT PROLENE 4 0 PS 2 18 (SUTURE) ×2 IMPLANT
SYR BULB 3OZ (MISCELLANEOUS) ×2 IMPLANT
SYR CONTROL 10ML LL (SYRINGE) ×4 IMPLANT
TOWEL OR 17X24 6PK STRL BLUE (TOWEL DISPOSABLE) ×2 IMPLANT
TOWEL OR NON WOVEN STRL DISP B (DISPOSABLE) ×2 IMPLANT
TRAY DSU PREP LF (CUSTOM PROCEDURE TRAY) ×2 IMPLANT
UNDERPAD 30X30 (UNDERPADS AND DIAPERS) ×2 IMPLANT

## 2015-06-15 NOTE — Anesthesia Procedure Notes (Signed)
Procedure Name: MAC Date/Time: 06/15/2015 7:50 AM Performed by: Ellison Rieth D Pre-anesthesia Checklist: Patient identified, Emergency Drugs available, Suction available, Patient being monitored and Timeout performed Patient Re-evaluated:Patient Re-evaluated prior to inductionOxygen Delivery Method: Simple face mask

## 2015-06-15 NOTE — Transfer of Care (Signed)
Immediate Anesthesia Transfer of Care Note  Patient: Priscilla Underwood  Procedure(s) Performed: Procedure(s): LEFT CARPAL TUNNEL RELEASE (Left)  Patient Location: PACU  Anesthesia Type:MAC  Level of Consciousness: awake and patient cooperative  Airway & Oxygen Therapy: Patient Spontanous Breathing  Post-op Assessment: Report given to RN and Post -op Vital signs reviewed and stable  Post vital signs: Reviewed and stable  Last Vitals:  Filed Vitals:   06/15/15 0625  BP: 108/77  Pulse: 52  Temp: 36.7 C  Resp: 20    Complications: No apparent anesthesia complications

## 2015-06-15 NOTE — Anesthesia Postprocedure Evaluation (Signed)
  Anesthesia Post-op Note  Patient: Priscilla Underwood  Procedure(s) Performed: Procedure(s): LEFT CARPAL TUNNEL RELEASE (Left)  Patient Location: PACU  Anesthesia Type:MAC  Level of Consciousness: awake, alert  and oriented  Airway and Oxygen Therapy: Patient Spontanous Breathing  Post-op Pain: mild  Post-op Assessment: Post-op Vital signs reviewed              Post-op Vital Signs: Reviewed  Last Vitals:  Filed Vitals:   06/15/15 0915  BP: 135/65  Pulse: 51  Temp: 36.6 C  Resp: 18    Complications: No apparent anesthesia complications

## 2015-06-15 NOTE — Discharge Instructions (Signed)
°  Post Anesthesia Home Care Instructions  Activity: Get plenty of rest for the remainder of the day. A responsible adult should stay with you for 24 hours following the procedure.  For the next 24 hours, DO NOT: -Drive a car -Paediatric nurse -Drink alcoholic beverages -Take any medication unless instructed by your physician -Make any legal decisions or sign important papers.  Meals: Start with liquid foods such as gelatin or soup. Progress to regular foods as tolerated. Avoid greasy, spicy, heavy foods. If nausea and/or vomiting occur, drink only clear liquids until the nausea and/or vomiting subsides. Call your physician if vomiting continues.  Special Instructions/Symptoms: Your throat may feel dry or sore from the anesthesia or the breathing tube placed in your throat during surgery. If this causes discomfort, gargle with warm salt water. The discomfort should disappear within 24 hours.    Discharge Instructions  Keep bandage clean and dry.  Call for any problems.  No smoking.  Criteria for driving a car: you should be off your pain medicine for 7-8 hours, able to drive one handed(confident), thinking clearly and feeling able in your judgement to drive. Continue elevation as it will decrease swelling.  If instructed by MD move your fingers within the confines of the bandage/splint.  Use ice if instructed by your MD. Call immediately for any sudden loss of feeling in your hand/arm or change in functional abilities of the extremity.We recommend that you to take vitamin C 1000 mg a day to promote healing.we also recommend vitamin B 6 200 mg a day to promote nerve healing   We also recommend that if you require  pain medicine that you take a stool softener to prevent constipation as most pain medicines will have constipation side effects. We recommend either Peri-Colace or Senokot and recommend that you also consider adding MiraLAX to prevent the constipation affects from pain medicine if  you are required to use them. These medicines are over the counter and maybe purchased at a local pharmacy. A cup of yogurt and a probiotic can also be helpful during the recovery process as the medicines can disrupt your intestinal environment.

## 2015-06-15 NOTE — H&P (Signed)
Priscilla Underwood is an 54 y.o. female.   Chief Complaint: left CTS TKZ:SWFU L CTR Patient presents for evaluation and treatment of the of their upper extremity predicament. The patient denies neck, back, chest or  abdominal pain. The patient notes that they have no lower extremity problems. The patients primary complaint is noted. We are planning surgical care pathway for the upper extremity.  Past Medical History  Diagnosis Date  . Arthritis   . GERD (gastroesophageal reflux disease)   . Depression     Past Surgical History  Procedure Laterality Date  . Tarsal tunnel release Left 2012  . Bunionectomy  2004  . Shoulder surgery  2010, 2008  . Tubal ligation  1985    History reviewed. No pertinent family history. Social History:  reports that she has never smoked. She does not have any smokeless tobacco history on file. She reports that she does not drink alcohol or use illicit drugs.  Allergies:  Allergies  Allergen Reactions  . Ciprofloxacin Rash    Medications Prior to Admission  Medication Sig Dispense Refill  . diclofenac (VOLTAREN) 75 MG EC tablet Take 75 mg by mouth 2 (two) times daily.    . Pantoprazole Sodium (PROTONIX PO) Take by mouth.    . sertraline (ZOLOFT) 50 MG tablet Take 50 mg by mouth daily.    . traMADol (ULTRAM) 50 MG tablet Take 50 mg by mouth every 6 (six) hours as needed. for pain  0    No results found for this or any previous visit (from the past 48 hour(s)). No results found.  Review of Systems  Constitutional: Negative.   Eyes: Negative.   Respiratory: Negative.   Gastrointestinal: Negative.   Genitourinary: Negative.   Neurological: Negative.     Blood pressure 108/77, pulse 52, temperature 98 F (36.7 C), temperature source Oral, resp. rate 20, height 5\' 5"  (1.651 m), weight 86.637 kg (191 lb), SpO2 100 %. Physical Exam  L CTS with postive phalens tinels and mnct The patient is alert and oriented in no acute distress. The patient  complains of pain in the affected upper extremity.  The patient is noted to have a normal HEENT exam. Lung fields show equal chest expansion and no shortness of breath. Abdomen exam is nontender without distention. Lower extremity examination does not show any fracture dislocation or blood clot symptoms. Pelvis is stable and the neck and back are stable and nontender. Assessment/Plan  plan L CTR We are planning surgery for your upper extremity. The risk and benefits of surgery to include risk of bleeding, infection, anesthesia,  damage to normal structures and failure of the surgery to accomplish its intended goals of relieving symptoms and restoring function have been discussed in detail. With this in mind we plan to proceed. I have specifically discussed with the patient the pre-and postoperative regime and the dos and don'ts and risk and benefits in great detail. Risk and benefits of surgery also include risk of dystrophy(CRPS), chronic nerve pain, failure of the healing process to go onto completion and other inherent risks of surgery The relavent the pathophysiology of the disease/injury process, as well as the alternatives for treatment and postoperative course of action has been discussed in great detail with the patient who desires to proceed.  We will do everything in our power to help you (the patient) restore function to the upper extremity. It is a pleasure to see this patient today.    Paulene Floor 06/15/2015, 7:41 AM

## 2015-06-15 NOTE — Op Note (Signed)
See op note dictation [phone] Saleha Kalp Md

## 2015-06-15 NOTE — Anesthesia Preprocedure Evaluation (Addendum)
Anesthesia Evaluation  Patient identified by MRN, date of birth, ID band Patient awake    Reviewed: Allergy & Precautions, NPO status , Patient's Chart, lab work & pertinent test results  Airway Mallampati: I  TM Distance: >3 FB Neck ROM: Full    Dental   Pulmonary neg pulmonary ROS,  breath sounds clear to auscultation        Cardiovascular negative cardio ROS  Rhythm:Regular Rate:Normal     Neuro/Psych PSYCHIATRIC DISORDERS Depression negative neurological ROS     GI/Hepatic Neg liver ROS, GERD-  Medicated,  Endo/Other  Morbid obesity  Renal/GU negative Renal ROS     Musculoskeletal   Abdominal   Peds  Hematology negative hematology ROS (+)   Anesthesia Other Findings   Reproductive/Obstetrics                            Anesthesia Physical Anesthesia Plan  ASA: II  Anesthesia Plan: MAC   Post-op Pain Management:    Induction: Intravenous  Airway Management Planned: Natural Airway and Simple Face Mask  Additional Equipment:   Intra-op Plan:   Post-operative Plan:   Informed Consent: I have reviewed the patients History and Physical, chart, labs and discussed the procedure including the risks, benefits and alternatives for the proposed anesthesia with the patient or authorized representative who has indicated his/her understanding and acceptance.   Dental advisory given  Plan Discussed with: CRNA  Anesthesia Plan Comments:        Anesthesia Quick Evaluation

## 2015-06-18 ENCOUNTER — Encounter (HOSPITAL_BASED_OUTPATIENT_CLINIC_OR_DEPARTMENT_OTHER): Payer: Self-pay | Admitting: Orthopedic Surgery

## 2015-06-18 NOTE — Op Note (Signed)
Priscilla Underwood, Priscilla Underwood             ACCOUNT NO.:  [id number]  MEDICAL RECORD NO.:  [id number]  LOCATION:                               FACILITY:  [place]  PHYSICIAN:  Satira Anis. Sallee Hogrefe, M.D.DATE OF BIRTH:  [birth year]  DATE OF PROCEDURE:  06/15/2015 DATE OF DISCHARGE:  06/15/2015                              OPERATIVE REPORT   PREOPERATIVE DIAGNOSIS:  Carpal tunnel syndrome, left upper extremity.  POSTOPERATIVE DIAGNOSIS:  Carpal tunnel syndrome, left upper extremity.  PROCEDURE: 1. Left median nerve/peripheral nerve block wrist from level for     anesthetic purposes for carpal tunnel release. 2. Left limited open carpal tunnel release.  SURGEON:  Satira Anis. Amedeo Plenty, M.D.  ASSISTANT:  None.  COMPLICATIONS:  None.  ANESTHESIA:  Peripheral nerve block with IV sedation keeping the patient awake, alert, oriented intact case.  TOURNIQUET TIME:  Less than 10 minutes.  INDICATIONS:  This 54 year old female, presents with the above-mentioned diagnosis.  I have counseled with her in regard to risks and benefits of surgery, and she desires to proceed the above-mentioned operative intervention.  All questions have been encouraged and answered and addressed.  OPERATIVE PROCEDURE:  The patient was seen by myself and Anesthesia, taken to the operative theater and underwent a very careful cautious approach to the extremity.  She was given a block of Sensorcaine and lidocaine with epinephrine.  Following this, we then very carefully and cautiously prepped and draped the arm.  Time-out was called. Preoperative antibiotics were given in the form of Ancef and the patient underwent incision 1 cm in nature at the distal edge of the transverse carpal ligament under 250 mmHg tourniquet control.  Dissection was then carried out through the palmar fascia, which was incised.  The distal edge of the transverse carpal ligament was released under 4.0 loupe magnification.  Fat pad egressed nicely and  following this distal proximal dissection was carried out until adequate room was available for canal, prepared toward device 1, 2, and 3 which were placed just under the proximal leading leaflet of the transverse carpal ligament. Following this, the patient then underwent placement of the security clip, security clip was placed just under the proximal leading leaflet. It had the obturator disengaged nicely and the security knife was placed in a security clip effectively releasing the transverse carpal ligament proximally and portions of the antebrachial fascia.  The patient was awake, alert, and oriented during all points and passes, had no discomfort, problems or complicating features.  The median nerve was nicely decompressed.  It was hyperemic, intact, and there were no issues, problems, or complicating features.  The area was irrigated, following this tourniquet was deflated.  Hemostasis secured and wound closed with Prolene.  The patient tolerated this well.  There were no complicating features.  All sponge, needle, and instrument counts were correct.  She will be monitored in recovery room, discharged to home.  We will see her in a week, therapy in 12 days.  Notify us should any problems occur.  These notes have been discussed and all questions have been encouraged and answered.     Satira Anis. Amedeo Plenty, M.D.   ______________________________ Satira Anis. Amedeo Plenty, M.D.  WMG/MEDQ  D:  06/15/2015  T:  06/15/2015  Job:  [id number]

## 2015-08-08 ENCOUNTER — Other Ambulatory Visit: Payer: Self-pay | Admitting: Orthopedic Surgery

## 2015-08-27 ENCOUNTER — Encounter (HOSPITAL_BASED_OUTPATIENT_CLINIC_OR_DEPARTMENT_OTHER): Payer: Self-pay | Admitting: *Deleted

## 2015-08-31 ENCOUNTER — Encounter (HOSPITAL_BASED_OUTPATIENT_CLINIC_OR_DEPARTMENT_OTHER)
Admission: RE | Disposition: A | Discharge status: Home / Self Care | Payer: Self-pay | Source: Ambulatory Visit | Attending: Orthopedic Surgery

## 2015-08-31 ENCOUNTER — Ambulatory Visit (HOSPITAL_BASED_OUTPATIENT_CLINIC_OR_DEPARTMENT_OTHER): Payer: 59 | Admitting: Anesthesiology

## 2015-08-31 ENCOUNTER — Encounter (HOSPITAL_BASED_OUTPATIENT_CLINIC_OR_DEPARTMENT_OTHER): Payer: Self-pay | Admitting: *Deleted

## 2015-08-31 ENCOUNTER — Ambulatory Visit (HOSPITAL_BASED_OUTPATIENT_CLINIC_OR_DEPARTMENT_OTHER)
Admission: RE | Admit: 2015-08-31 | Discharge: 2015-08-31 | Disposition: A | Discharge status: Home / Self Care | Payer: 59 | Source: Ambulatory Visit | Attending: Orthopedic Surgery | Admitting: Orthopedic Surgery

## 2015-08-31 DIAGNOSIS — Z881 Allergy status to other antibiotic agents status: Secondary | ICD-10-CM | POA: Insufficient documentation

## 2015-08-31 DIAGNOSIS — Z6831 Body mass index (BMI) 31.0-31.9, adult: Secondary | ICD-10-CM | POA: Insufficient documentation

## 2015-08-31 DIAGNOSIS — Z87891 Personal history of nicotine dependence: Secondary | ICD-10-CM | POA: Insufficient documentation

## 2015-08-31 DIAGNOSIS — G5601 Carpal tunnel syndrome, right upper limb: Secondary | ICD-10-CM | POA: Diagnosis present

## 2015-08-31 DIAGNOSIS — F329 Major depressive disorder, single episode, unspecified: Secondary | ICD-10-CM | POA: Diagnosis not present

## 2015-08-31 DIAGNOSIS — M797 Fibromyalgia: Secondary | ICD-10-CM | POA: Insufficient documentation

## 2015-08-31 HISTORY — PX: CARPAL TUNNEL RELEASE: SHX101

## 2015-08-31 HISTORY — DX: Fibromyalgia: M79.7

## 2015-08-31 HISTORY — DX: Other specified postprocedural states: R11.2

## 2015-08-31 HISTORY — DX: Other specified postprocedural states: Z98.890

## 2015-08-31 SURGERY — CARPAL TUNNEL RELEASE
Anesthesia: Monitor Anesthesia Care | Site: Wrist | Laterality: Right

## 2015-08-31 MED ORDER — FENTANYL CITRATE (PF) 100 MCG/2ML IJ SOLN: 25.0000 ug | INTRAMUSCULAR | Status: DC | PRN

## 2015-08-31 MED ORDER — MIDAZOLAM HCL 2 MG/2ML IJ SOLN
1.0000 mg | INTRAMUSCULAR | Status: DC | PRN
  Administered 2015-08-31 (×2): 1 mg via INTRAVENOUS

## 2015-08-31 MED ORDER — PROMETHAZINE HCL 25 MG/ML IJ SOLN: 6.2500 mg | INTRAMUSCULAR | Status: DC | PRN

## 2015-08-31 MED ORDER — LACTATED RINGERS IV SOLN
INTRAVENOUS | Status: DC
  Administered 2015-08-31 (×2): via INTRAVENOUS

## 2015-08-31 MED ORDER — FENTANYL CITRATE (PF) 100 MCG/2ML IJ SOLN
INTRAMUSCULAR | Status: AC
  Filled 2015-08-31: qty 4

## 2015-08-31 MED ORDER — ONDANSETRON HCL 4 MG/2ML IJ SOLN
INTRAMUSCULAR | Status: DC | PRN
  Administered 2015-08-31: 4 mg via INTRAVENOUS

## 2015-08-31 MED ORDER — SODIUM CHLORIDE 0.45 % IV SOLN: INTRAVENOUS | Status: DC

## 2015-08-31 MED ORDER — CEFAZOLIN SODIUM-DEXTROSE 2-3 GM-% IV SOLR
2.0000 g | INTRAVENOUS | Status: AC
  Administered 2015-08-31: 2 g via INTRAVENOUS

## 2015-08-31 MED ORDER — MIDAZOLAM HCL 2 MG/2ML IJ SOLN: 0.5000 mg | Freq: Once | INTRAMUSCULAR | Status: DC | PRN

## 2015-08-31 MED ORDER — CEFAZOLIN SODIUM-DEXTROSE 2-3 GM-% IV SOLR
INTRAVENOUS | Status: AC
  Filled 2015-08-31: qty 50

## 2015-08-31 MED ORDER — MIDAZOLAM HCL 2 MG/2ML IJ SOLN
INTRAMUSCULAR | Status: AC
  Filled 2015-08-31: qty 4

## 2015-08-31 MED ORDER — SCOPOLAMINE 1 MG/3DAYS TD PT72: 1.0000 | MEDICATED_PATCH | Freq: Once | TRANSDERMAL | Status: DC | PRN

## 2015-08-31 MED ORDER — LIDOCAINE HCL (PF) 1 % IJ SOLN
INTRAMUSCULAR | Status: AC
  Filled 2015-08-31: qty 30

## 2015-08-31 MED ORDER — CHLORHEXIDINE GLUCONATE 4 % EX LIQD: 60.0000 mL | Freq: Once | CUTANEOUS | Status: DC

## 2015-08-31 MED ORDER — GLYCOPYRROLATE 0.2 MG/ML IJ SOLN: 0.2000 mg | Freq: Once | INTRAMUSCULAR | Status: DC | PRN

## 2015-08-31 MED ORDER — SODIUM BICARBONATE 4 % IV SOLN
INTRAVENOUS | Status: DC | PRN
  Administered 2015-08-31: 20 mL via INTRAMUSCULAR

## 2015-08-31 MED ORDER — MEPERIDINE HCL 25 MG/ML IJ SOLN: 6.2500 mg | INTRAMUSCULAR | Status: DC | PRN

## 2015-08-31 MED ORDER — FENTANYL CITRATE (PF) 100 MCG/2ML IJ SOLN
50.0000 ug | INTRAMUSCULAR | Status: DC | PRN
  Administered 2015-08-31 (×2): 50 ug via INTRAVENOUS

## 2015-08-31 MED ORDER — ONDANSETRON HCL 4 MG/2ML IJ SOLN
INTRAMUSCULAR | Status: AC
  Filled 2015-08-31: qty 2

## 2015-08-31 MED ORDER — SODIUM BICARBONATE 4 % IV SOLN
INTRAVENOUS | Status: AC
  Filled 2015-08-31: qty 5

## 2015-08-31 SURGICAL SUPPLY — 48 items
BANDAGE ELASTIC 3 VELCRO ST LF (GAUZE/BANDAGES/DRESSINGS) ×2 IMPLANT
BLADE CARPAL TUNNEL SNGL USE (BLADE) ×2 IMPLANT
BLADE SURG 15 STRL LF DISP TIS (BLADE) ×2 IMPLANT
BLADE SURG 15 STRL SS (BLADE) ×4
BNDG CONFORM 3 STRL LF (GAUZE/BANDAGES/DRESSINGS) ×2 IMPLANT
BRUSH SCRUB EZ PLAIN DRY (MISCELLANEOUS) ×2 IMPLANT
CORDS BIPOLAR (ELECTRODE) ×2 IMPLANT
COVER BACK TABLE 60X90IN (DRAPES) ×2 IMPLANT
CUFF TOURNIQUET SINGLE 18IN (TOURNIQUET CUFF) ×1 IMPLANT
DRAIN PENROSE 1/4X12 LTX STRL (WOUND CARE) IMPLANT
DRAPE EXTREMITY T 121X128X90 (DRAPE) ×2 IMPLANT
DRAPE SURG 17X23 STRL (DRAPES) ×2 IMPLANT
DRSG EMULSION OIL 3X3 NADH (GAUZE/BANDAGES/DRESSINGS) ×2 IMPLANT
GAUZE SPONGE 4X4 12PLY STRL (GAUZE/BANDAGES/DRESSINGS) IMPLANT
GAUZE SPONGE 4X4 16PLY XRAY LF (GAUZE/BANDAGES/DRESSINGS) IMPLANT
GAUZE XEROFORM 1X8 LF (GAUZE/BANDAGES/DRESSINGS) ×2 IMPLANT
GLOVE BIO SURGEON STRL SZ 6.5 (GLOVE) ×1 IMPLANT
GLOVE BIOGEL M STRL SZ7.5 (GLOVE) IMPLANT
GLOVE BIOGEL PI IND STRL 7.0 (GLOVE) IMPLANT
GLOVE BIOGEL PI INDICATOR 7.0 (GLOVE) ×1
GLOVE SS BIOGEL STRL SZ 8 (GLOVE) ×1 IMPLANT
GLOVE SUPERSENSE BIOGEL SZ 8 (GLOVE) ×1
GOWN STRL REUS W/ TWL LRG LVL3 (GOWN DISPOSABLE) ×1 IMPLANT
GOWN STRL REUS W/ TWL XL LVL3 (GOWN DISPOSABLE) ×1 IMPLANT
GOWN STRL REUS W/TWL LRG LVL3 (GOWN DISPOSABLE) ×2
GOWN STRL REUS W/TWL XL LVL3 (GOWN DISPOSABLE) ×2
LOOP VESSEL MAXI BLUE (MISCELLANEOUS) IMPLANT
NDL HYPO 25X1 1.5 SAFETY (NEEDLE) ×2 IMPLANT
NDL SAFETY ECLIPSE 18X1.5 (NEEDLE) ×1 IMPLANT
NEEDLE HYPO 18GX1.5 SHARP (NEEDLE) ×2
NEEDLE HYPO 22GX1.5 SAFETY (NEEDLE) IMPLANT
NEEDLE HYPO 25X1 1.5 SAFETY (NEEDLE) ×2 IMPLANT
NS IRRIG 1000ML POUR BTL (IV SOLUTION) ×2 IMPLANT
PACK BASIN DAY SURGERY FS (CUSTOM PROCEDURE TRAY) ×2 IMPLANT
PAD ALCOHOL SWAB (MISCELLANEOUS) ×16 IMPLANT
PAD CAST 3X4 CTTN HI CHSV (CAST SUPPLIES) ×2 IMPLANT
PADDING CAST ABS 4INX4YD NS (CAST SUPPLIES) ×1
PADDING CAST ABS COTTON 4X4 ST (CAST SUPPLIES) ×1 IMPLANT
PADDING CAST COTTON 3X4 STRL (CAST SUPPLIES) ×4
SHEET MEDIUM DRAPE 40X70 STRL (DRAPES) ×2 IMPLANT
STOCKINETTE 4X48 STRL (DRAPES) ×2 IMPLANT
SUT PROLENE 4 0 PS 2 18 (SUTURE) ×2 IMPLANT
SYR BULB 3OZ (MISCELLANEOUS) ×2 IMPLANT
SYR CONTROL 10ML LL (SYRINGE) ×4 IMPLANT
TOWEL OR 17X24 6PK STRL BLUE (TOWEL DISPOSABLE) ×2 IMPLANT
TOWEL OR NON WOVEN STRL DISP B (DISPOSABLE) ×2 IMPLANT
TRAY DSU PREP LF (CUSTOM PROCEDURE TRAY) ×2 IMPLANT
UNDERPAD 30X30 (UNDERPADS AND DIAPERS) ×2 IMPLANT

## 2015-08-31 NOTE — Discharge Instructions (Signed)
Keep bandage clean and dry.  Call for any problems.  No smoking.  Criteria for driving a car: you should be off your pain medicine for 7-8 hours, able to drive one handed(confident), thinking clearly and feeling able in your judgement to drive. Continue elevation as it will decrease swelling.  If instructed by MD move your fingers within the confines of the bandage/splint.  Use ice if instructed by your MD. Call immediately for any sudden loss of feeling in your hand/arm or change in functional abilities of the extremity.We recommend that you to take vitamin C 1000 mg a day to promote healing. We also recommend that if you require  pain medicine that you take a stool softener to prevent constipation as most pain medicines will have constipation side effects. We recommend either Peri-Colace or Senokot and recommend that you also consider adding MiraLAX to prevent the constipation affects from pain medicine if you are required to use them. These medicines are over the counter and maybe purchased at a local pharmacy. A cup of yogurt and a probiotic can also be helpful during the recovery process as the medicines can disrupt your intestinal environment.  We rec Vit B6 200 mg a day      Post Anesthesia Home Care Instructions  Activity: Get plenty of rest for the remainder of the day. A responsible adult should stay with you for 24 hours following the procedure.  For the next 24 hours, DO NOT: -Drive a car -Paediatric nurse -Drink alcoholic beverages -Take any medication unless instructed by your physician -Make any legal decisions or sign important papers.  Meals: Start with liquid foods such as gelatin or soup. Progress to regular foods as tolerated. Avoid greasy, spicy, heavy foods. If nausea and/or vomiting occur, drink only clear liquids until the nausea and/or vomiting subsides. Call your physician if vomiting continues.  Special Instructions/Symptoms: Your throat may feel dry or sore from  the anesthesia or the breathing tube placed in your throat during surgery. If this causes discomfort, gargle with warm salt water. The discomfort should disappear within 24 hours.    If you had a scopolamine patch placed behind your ear for the management of post- operative nausea and/or vomiting:  1. The medication in the patch is effective for 72 hours, after which it should be removed.  Wrap patch in a tissue and discard in the trash. Wash hands thoroughly with soap and water. 2. You may remove the patch earlier than 72 hours if you experience unpleasant side effects which may include dry mouth, dizziness or visual disturbances. 3. Avoid touching the patch. Wash your hands with soap and water after contact with the patch.

## 2015-08-31 NOTE — Op Note (Signed)
Priscilla Underwood, Priscilla Underwood             ACCOUNT NO.:  [id number]  MEDICAL RECORD NO.:  [id number]  LOCATION:                               FACILITY:  [place]  PHYSICIAN:  Satira Anis. Gramig, M.D.DATE OF BIRTH:  [birth year]  DATE OF PROCEDURE:  08/31/2015 DATE OF DISCHARGE:  08/31/2015                              OPERATIVE REPORT   PREOPERATIVE DIAGNOSIS:  Right carpal tunnel syndrome.  POSTOPERATIVE DIAGNOSIS:  Right carpal tunnel syndrome.  PROCEDURE: 1. Right median nerve/peripheral nerve block wrist-forearm level for     anesthetic purposes for carpal tunnel release. 2. Right limited open carpal tunnel release.  SURGEON:  Satira Anis. Amedeo Plenty, M.D.  ASSISTANT:  None.  COMPLICATIONS:  None.  ANESTHESIA:  Peripheral nerve block with IV sedation keeping the patient awake, alert, and oriented the entire case.  TOURNIQUET TIME:  Less than 10 minutes.  INDICATIONS:  The patient is a pleasant female, who has had a successful left release in the past and presents for right limited open carpal tunnel release.  I have discussed with the patient risks and benefits, do's and don'ts, timeframe duration of recovery, and other issues.  OPERATION IN DETAIL:  The patient was seen by myself and Anesthesia, taken to the operative suite, time-out was observed.  Pre and postop checklist was secured.  Preoperative antibiotics were given and she was given a median nerve/field block at the right wrist-forearm level.  She was then prepped and draped the second time.  Sterile field was secured. Tourniquet was insufflated and 1-cm incision was made at the distal edge of the transverse carpal ligament.  Following this, palmar fascia was incised, distal edge released under 4.0 loupe magnification.  Fat pad egressed nicely.  Superficial palmar arch carefully protected and following confirmation of distal release with 4.0 loupe magnification, I then dissected in a distal to proximal direction until adequate  room was available for canal, prepared toward device 1, 2, and 3, which were placed just under the proximal leading leaflet of the transverse carpal ligament.  The patient tolerated this well.  She was wake, alert and oriented.  Following this, the security clip was placed, obturator disengaged, security knife was placed and security clip effectively releasing the proximal leaflet, she tolerated this well.  Full canal inspection revealed adequately decompressed median nerve.  No complicating features.  No space-occupying lesions.  The patient had irrigation applied.  Tourniquet deflated and wound closed with Prolene after hemostasis was secured.  Sterile bandage was applied.  Elevate, move, massage of fingers, see me back in a week.  Notify me should any problems occur.  All questions have been addressed.     Satira Anis. Amedeo Plenty, M.D.     [facility]  D:  08/31/2015  T:  08/31/2015  Job:  [id number]

## 2015-08-31 NOTE — H&P (Signed)
Priscilla Underwood is an 54 y.o. female.   Chief Complaint: R CTS HPI: Patient presents for evaluation and treatment of the of their upper extremity predicament. The patient denies neck, back, chest or  abdominal pain. The patient notes that they have no lower extremity problems. The patients primary complaint is noted. We are planning surgical care pathway for the upper extremity.  Past Medical History  Diagnosis Date  . GERD (gastroesophageal reflux disease)   . Depression   . Arthritis     osteo arthritis  . Fibromyalgia   . PONV (postoperative nausea and vomiting)     Past Surgical History  Procedure Laterality Date  . Tarsal tunnel release Left 2012  . Bunionectomy  2004  . Shoulder surgery  2010, 2008  . Tubal ligation  1985  . Carpal tunnel release Left 06/15/2015    Procedure: LEFT CARPAL TUNNEL RELEASE;  Surgeon: Roseanne Kaufman, MD;  Location: [place];  Service: Orthopedics;  Laterality: Left;    History reviewed. No pertinent family history. Social History:  reports that she has quit smoking. Her smoking use included Cigarettes. She does not have any smokeless tobacco history on file. She reports that she does not drink alcohol or use illicit drugs.  Allergies:  Allergies  Allergen Reactions  . Ciprofloxacin Rash  . Avelox [Moxifloxacin Hcl In Nacl] Rash    Medications Prior to Admission  Medication Sig Dispense Refill  . Ascorbic Acid (VITAMIN C) 100 MG tablet Take 100 mg by mouth daily.    Marland Kitchen BIOTIN PO Take by mouth.    . meloxicam (MOBIC) 7.5 MG tablet Take 7.5 mg by mouth daily.    . Pantoprazole Sodium (PROTONIX PO) Take by mouth.    . pyridOXINE (VITAMIN B-6) 100 MG tablet Take 100 mg by mouth daily.    . sertraline (ZOLOFT) 50 MG tablet Take 50 mg by mouth daily.      No results found for this or any previous visit (from the past 48 hour(s)). No results found.  Review of Systems  Respiratory: Negative.   Cardiovascular: Negative.    Gastrointestinal: Negative.   Genitourinary: Negative.   Neurological: Negative.   Endo/Heme/Allergies: Negative.     Blood pressure 115/76, pulse 55, temperature 97.9 F (36.6 C), temperature source Oral, resp. rate 16, height 5\' 5"  (1.651 m), weight 85.446 kg (188 lb 6 oz), SpO2 99 %. Physical Exam R CTS  The patient is alert and oriented in no acute distress. The patient complains of pain in the affected upper extremity.  The patient is noted to have a normal HEENT exam. Lung fields show equal chest expansion and no shortness of breath. Abdomen exam is nontender without distention. Lower extremity examination does not show any fracture dislocation or blood clot symptoms. Pelvis is stable and the neck and back are stable and nontender.  Assessment/Plan Plan for R CTR  We are planning surgery for your upper extremity. The risk and benefits of surgery to include risk of bleeding, infection, anesthesia,  damage to normal structures and failure of the surgery to accomplish its intended goals of relieving symptoms and restoring function have been discussed in detail. With this in mind we plan to proceed. I have specifically discussed with the patient the pre-and postoperative regime and the dos and don'ts and risk and benefits in great detail. Risk and benefits of surgery also include risk of dystrophy(CRPS), chronic nerve pain, failure of the healing process to go onto completion and other inherent  risks of surgery The relavent the pathophysiology of the disease/injury process, as well as the alternatives for treatment and postoperative course of action has been discussed in great detail with the patient who desires to proceed.  We will do everything in our power to help you (the patient) restore function to the upper extremity. It is a pleasure to see this patient today.   Paulene Floor 08/31/2015, 9:29 AM

## 2015-08-31 NOTE — Transfer of Care (Signed)
Immediate Anesthesia Transfer of Care Note  Patient: Priscilla Underwood  Procedure(s) Performed: Procedure(s): RIGHT LIMITED OPEN CARPAL TUNNEL RELEASE (Right)  Patient Location: PACU  Anesthesia Type:MAC  Level of Consciousness: awake, alert , oriented and patient cooperative  Airway & Oxygen Therapy: Patient Spontanous Breathing  Post-op Assessment: Report given to RN and Post -op Vital signs reviewed and stable  Post vital signs: Reviewed and stable  Last Vitals:  Filed Vitals:   08/31/15 0737  BP: 115/76  Pulse: 55  Temp: 36.6 C  Resp: 16    Complications: No apparent anesthesia complications

## 2015-08-31 NOTE — Anesthesia Procedure Notes (Signed)
Procedure Name: MAC Date/Time: 08/31/2015 9:30 AM Performed by: Rhyleigh Grassel D Pre-anesthesia Checklist: Patient identified, Emergency Drugs available, Suction available, Patient being monitored and Timeout performed Patient Re-evaluated:Patient Re-evaluated prior to inductionOxygen Delivery Method: Simple face mask

## 2015-08-31 NOTE — Anesthesia Preprocedure Evaluation (Addendum)
Anesthesia Evaluation  Patient identified by MRN, date of birth, ID band Patient awake    Reviewed: Allergy & Precautions, NPO status , Patient's Chart, lab work & pertinent test results  History of Anesthesia Complications (+) PONV and history of anesthetic complications  Airway Mallampati: I  TM Distance: >3 FB Neck ROM: Full    Dental  (+) Dental Advisory Given   Pulmonary neg pulmonary ROS, former smoker,    breath sounds clear to auscultation       Cardiovascular negative cardio ROS   Rhythm:Regular Rate:Normal     Neuro/Psych Depression negative neurological ROS     GI/Hepatic Neg liver ROS, GERD  Medicated and Controlled,  Endo/Other  Morbid obesity  Renal/GU negative Renal ROS     Musculoskeletal  (+) Fibromyalgia -  Abdominal (+) + obese,   Peds  Hematology negative hematology ROS (+)   Anesthesia Other Findings   Reproductive/Obstetrics                           Anesthesia Physical Anesthesia Plan  ASA: II  Anesthesia Plan: MAC   Post-op Pain Management:    Induction: Intravenous  Airway Management Planned: Natural Airway and Nasal Cannula  Additional Equipment:   Intra-op Plan:   Post-operative Plan:   Informed Consent: I have reviewed the patients History and Physical, chart, labs and discussed the procedure including the risks, benefits and alternatives for the proposed anesthesia with the patient or authorized representative who has indicated his/her understanding and acceptance.   Dental advisory given  Plan Discussed with: CRNA and Surgeon  Anesthesia Plan Comments: (Plan routine monitors, MAC)        Anesthesia Quick Evaluation

## 2015-08-31 NOTE — Op Note (Signed)
See [id number]#[id number] Amedeo Plenty MD

## 2015-08-31 NOTE — Anesthesia Postprocedure Evaluation (Signed)
  Anesthesia Post-op Note  Patient: Priscilla Underwood  Procedure(s) Performed: Procedure(s): RIGHT LIMITED OPEN CARPAL TUNNEL RELEASE (Right)  Patient Location: PACU  Anesthesia Type:MAC  Level of Consciousness: awake, alert , oriented and patient cooperative  Airway and Oxygen Therapy: Patient Spontanous Breathing  Post-op Pain: none  Post-op Assessment: Post-op Vital signs reviewed, Patient's Cardiovascular Status Stable, Respiratory Function Stable, Patent Airway, No signs of Nausea or vomiting and Pain level controlled              Post-op Vital Signs: Reviewed and stable  Last Vitals:  Filed Vitals:   08/31/15 1030  BP: 123/63  Pulse: 45  Temp:   Resp: 13    Complications: No apparent anesthesia complications

## 2015-09-03 ENCOUNTER — Encounter (HOSPITAL_BASED_OUTPATIENT_CLINIC_OR_DEPARTMENT_OTHER): Payer: Self-pay | Admitting: Orthopedic Surgery

## 2015-10-02 ENCOUNTER — Other Ambulatory Visit (HOSPITAL_COMMUNITY): Payer: Self-pay | Admitting: Neurosurgery

## 2015-10-02 DIAGNOSIS — M542 Cervicalgia: Secondary | ICD-10-CM

## 2015-10-05 ENCOUNTER — Ambulatory Visit (HOSPITAL_COMMUNITY)
Admission: RE | Admit: 2015-10-05 | Discharge: 2015-10-05 | Disposition: A | Discharge status: Home / Self Care | Payer: 59 | Source: Ambulatory Visit | Attending: Neurosurgery | Admitting: Neurosurgery

## 2015-10-05 DIAGNOSIS — M4802 Spinal stenosis, cervical region: Secondary | ICD-10-CM | POA: Diagnosis not present

## 2015-10-05 DIAGNOSIS — M47812 Spondylosis without myelopathy or radiculopathy, cervical region: Secondary | ICD-10-CM | POA: Insufficient documentation

## 2015-10-05 DIAGNOSIS — M542 Cervicalgia: Secondary | ICD-10-CM | POA: Insufficient documentation

## 2015-10-05 DIAGNOSIS — R51 Headache: Secondary | ICD-10-CM | POA: Insufficient documentation

## 2015-12-07 ENCOUNTER — Other Ambulatory Visit: Payer: Self-pay

## 2015-12-07 DIAGNOSIS — Z1231 Encounter for screening mammogram for malignant neoplasm of breast: Secondary | ICD-10-CM

## 2015-12-18 MED FILL — PANTOPRAZOLE SOD DR 40 MG T: 40 | 30 days supply | Qty: 60 | Fill #0

## 2015-12-26 MED FILL — ZOLPIDEM TARTRATE 5 MG TAB: 5 | 30 days supply | Qty: 30 | Fill #1

## 2015-12-29 MED FILL — MELOXICAM 15 MG TABLET: 15 | 30 days supply | Qty: 30 | Fill #3

## 2015-12-30 DIAGNOSIS — J209 Acute bronchitis, unspecified: Secondary | ICD-10-CM | POA: Diagnosis not present

## 2016-01-02 DIAGNOSIS — Z0000 Encounter for general adult medical examination without abnormal findings: Secondary | ICD-10-CM | POA: Diagnosis not present

## 2016-01-02 DIAGNOSIS — F329 Major depressive disorder, single episode, unspecified: Secondary | ICD-10-CM | POA: Diagnosis not present

## 2016-01-02 DIAGNOSIS — G4700 Insomnia, unspecified: Secondary | ICD-10-CM | POA: Diagnosis not present

## 2016-01-14 ENCOUNTER — Ambulatory Visit: Payer: 59

## 2016-01-15 MED FILL — SERTRALINE HCL 100 MG TAB: 100 | 30 days supply | Qty: 30 | Fill #0

## 2016-01-18 ENCOUNTER — Ambulatory Visit
Admission: RE | Admit: 2016-01-18 | Discharge: 2016-01-18 | Disposition: A | Discharge status: Home / Self Care | Payer: 59 | Source: Ambulatory Visit

## 2016-01-18 DIAGNOSIS — Z1231 Encounter for screening mammogram for malignant neoplasm of breast: Secondary | ICD-10-CM

## 2016-01-25 MED FILL — ZOLPIDEM TARTRATE 5 MG TAB: 5 | 30 days supply | Qty: 30 | Fill #0

## 2016-02-01 MED FILL — MELOXICAM 15 MG TABLET: 15 | 30 days supply | Qty: 30 | Fill #0

## 2016-02-01 MED FILL — PANTOPRAZOLE SOD DR 40 MG T: 40 | 30 days supply | Qty: 60 | Fill #0

## 2016-02-12 DIAGNOSIS — M797 Fibromyalgia: Secondary | ICD-10-CM | POA: Diagnosis not present

## 2016-02-12 DIAGNOSIS — M2550 Pain in unspecified joint: Secondary | ICD-10-CM | POA: Diagnosis not present

## 2016-02-12 DIAGNOSIS — M542 Cervicalgia: Secondary | ICD-10-CM | POA: Diagnosis not present

## 2016-02-12 DIAGNOSIS — Z79899 Other long term (current) drug therapy: Secondary | ICD-10-CM | POA: Diagnosis not present

## 2016-02-12 DIAGNOSIS — M7062 Trochanteric bursitis, left hip: Secondary | ICD-10-CM | POA: Diagnosis not present

## 2016-02-12 MED FILL — DICLOFENAC SOD EC 75 MG TAB: 75 | 30 days supply | Qty: 60 | Fill #0

## 2016-02-16 MED FILL — SERTRALINE HCL 100 MG TAB: 100 | 30 days supply | Qty: 30 | Fill #1

## 2016-02-26 MED FILL — ZOLPIDEM TARTRATE 5 MG TAB: 5 | 30 days supply | Qty: 30 | Fill #1

## 2016-03-04 DIAGNOSIS — J3489 Other specified disorders of nose and nasal sinuses: Secondary | ICD-10-CM | POA: Diagnosis not present

## 2016-03-04 DIAGNOSIS — J00 Acute nasopharyngitis [common cold]: Secondary | ICD-10-CM | POA: Diagnosis not present

## 2016-03-04 MED FILL — MUPIROCIN 2% OINTMENT: 2 | 20 days supply | Qty: 22 | Fill #0

## 2016-03-12 MED FILL — valACYclovir HCL 1 GM TABS: 1 | 30 days supply | Qty: 10 | Fill #0

## 2016-03-17 MED FILL — SERTRALINE HCL 100 MG TAB: 100 | 30 days supply | Qty: 30 | Fill #2

## 2016-03-27 DIAGNOSIS — Z6831 Body mass index (BMI) 31.0-31.9, adult: Secondary | ICD-10-CM | POA: Diagnosis not present

## 2016-03-27 DIAGNOSIS — M542 Cervicalgia: Secondary | ICD-10-CM | POA: Diagnosis not present

## 2016-03-27 DIAGNOSIS — M47812 Spondylosis without myelopathy or radiculopathy, cervical region: Secondary | ICD-10-CM | POA: Diagnosis not present

## 2016-03-27 MED FILL — ZOLPIDEM TARTRATE 5 MG TAB: 5 | 30 days supply | Qty: 30 | Fill #0

## 2016-04-15 ENCOUNTER — Emergency Department (HOSPITAL_COMMUNITY): Payer: 59

## 2016-04-15 ENCOUNTER — Emergency Department (HOSPITAL_COMMUNITY)
Admission: EM | Admit: 2016-04-15 | Discharge: 2016-04-15 | Disposition: A | Discharge status: Home / Self Care | Payer: 59 | Attending: Emergency Medicine | Admitting: Emergency Medicine

## 2016-04-15 ENCOUNTER — Encounter (HOSPITAL_COMMUNITY): Payer: Self-pay | Admitting: *Deleted

## 2016-04-15 DIAGNOSIS — Z7982 Long term (current) use of aspirin: Secondary | ICD-10-CM | POA: Insufficient documentation

## 2016-04-15 DIAGNOSIS — Z791 Long term (current) use of non-steroidal anti-inflammatories (NSAID): Secondary | ICD-10-CM | POA: Diagnosis not present

## 2016-04-15 DIAGNOSIS — M1990 Unspecified osteoarthritis, unspecified site: Secondary | ICD-10-CM | POA: Diagnosis not present

## 2016-04-15 DIAGNOSIS — K219 Gastro-esophageal reflux disease without esophagitis: Secondary | ICD-10-CM | POA: Diagnosis not present

## 2016-04-15 DIAGNOSIS — Z87891 Personal history of nicotine dependence: Secondary | ICD-10-CM | POA: Insufficient documentation

## 2016-04-15 DIAGNOSIS — R42 Dizziness and giddiness: Secondary | ICD-10-CM | POA: Insufficient documentation

## 2016-04-15 DIAGNOSIS — Z79899 Other long term (current) drug therapy: Secondary | ICD-10-CM | POA: Diagnosis not present

## 2016-04-15 DIAGNOSIS — F329 Major depressive disorder, single episode, unspecified: Secondary | ICD-10-CM | POA: Insufficient documentation

## 2016-04-15 DIAGNOSIS — R110 Nausea: Secondary | ICD-10-CM | POA: Insufficient documentation

## 2016-04-15 LAB — BASIC METABOLIC PANEL
Anion gap: 7 (ref 5–15)
BUN: 17 mg/dL (ref 6–20)
CO2: 26 mmol/L (ref 22–32)
Calcium: 9.2 mg/dL (ref 8.9–10.3)
Chloride: 105 mmol/L (ref 101–111)
Creatinine, Ser: 0.76 mg/dL (ref 0.44–1.00)
GFR calc Af Amer: >60 mL/min (ref >60)
GFR calc non Af Amer: >60 mL/min (ref >60)
Glucose, Bld: 95 mg/dL (ref 65–99)
Potassium: 4.2 mmol/L (ref 3.5–5.1)
Sodium: 138 mmol/L (ref 135–145)

## 2016-04-15 LAB — URINALYSIS, ROUTINE W REFLEX MICROSCOPIC
Bilirubin Urine: NEGATIVE
Glucose, UA: NEGATIVE mg/dL
Hgb urine dipstick: NEGATIVE
Ketones, ur: NEGATIVE mg/dL
Leukocytes, UA: NEGATIVE
Nitrite: NEGATIVE
Protein, ur: NEGATIVE mg/dL
Specific Gravity, Urine: 1.004 — ABNORMAL LOW (ref 1.005–1.030)
pH: 6.5 (ref 5.0–8.0)

## 2016-04-15 LAB — CBC
HCT: 39.1 % (ref 36.0–46.0)
Hemoglobin: 12.7 g/dL (ref 12.0–15.0)
MCH: 30.5 pg (ref 26.0–34.0)
MCHC: 32.5 g/dL (ref 30.0–36.0)
MCV: 94 fL (ref 78.0–100.0)
Platelets: 204 10*3/uL (ref 150–400)
RBC: 4.16 MIL/uL (ref 3.87–5.11)
RDW: 14.7 % (ref 11.5–15.5)
WBC: 5.4 10*3/uL (ref 4.0–10.5)

## 2016-04-15 LAB — CBG MONITORING, ED: Glucose-Capillary: 81 mg/dL (ref 65–99)

## 2016-04-15 LAB — I-STAT TROPONIN, ED: Troponin i, poc: 0 ng/mL (ref 0.00–0.08)

## 2016-04-15 MED ORDER — SODIUM CHLORIDE 0.9 % IV BOLUS (SEPSIS)
1000.0000 mL | Freq: Once | INTRAVENOUS | Status: AC
  Administered 2016-04-15: 1000 mL via INTRAVENOUS

## 2016-04-15 MED ORDER — MECLIZINE HCL 25 MG PO TABS: 25.0000 mg | ORAL_TABLET | Freq: Three times a day (TID) | ORAL | Status: DC | PRN

## 2016-04-15 MED ORDER — MECLIZINE HCL 25 MG PO TABS
25.0000 mg | ORAL_TABLET | Freq: Once | ORAL | Status: AC
  Administered 2016-04-15: 25 mg via ORAL
  Filled 2016-04-15: qty 1

## 2016-04-15 MED FILL — MECLIZINE 25 MG TABLET: 25 | 3 days supply | Qty: 10 | Fill #0

## 2016-04-15 NOTE — ED Notes (Signed)
Informed provider about patient requesting pain medication for headache.

## 2016-04-15 NOTE — ED Notes (Signed)
MD at bedside. 

## 2016-04-15 NOTE — ED Notes (Signed)
Pt returned from MRI °

## 2016-04-15 NOTE — ED Provider Notes (Signed)
CSN: [id number]     Arrival date & time 04/15/16  1146 History   First MD Initiated Contact with Patient 04/15/16 1301     Chief Complaint  Patient presents with  . Dizziness  . Nausea     (Consider location/radiation/quality/duration/timing/severity/associated sxs/prior Treatment) HPI  55 year old female presents with dizziness that started just prior to arrival. Patient was at work and while walking from her desk to the kitchen she noticed she felt dizzy. She felt both off balance and like she's gone a pass out. Currently at rest in the ER stretcher she feels better but still slightly off. Feels somewhat like she was diagnosed with positional vertigo. Worse whenever she turns her head. No ear pain or ear ringing. Has a chronic left occipital headache but this is not worse than typical. Mild nausea but no vomiting. No chest pain or shortness of breath. The blurry vision or weakness or numbness. She does not feel off balance with walking. Has been having "shoulder pain" at bilateral medial scapulae for 2-3 weeks. Not present now. Had left jaw pain near TMJ 1 week ago, also resolved. She is concerned about her heart. Her BP was 123456 systolic when someone checked which is high for her. Her typical HR is in low 60s.  Past Medical History  Diagnosis Date  . GERD (gastroesophageal reflux disease)   . Depression   . Arthritis     osteo arthritis  . Fibromyalgia   . PONV (postoperative nausea and vomiting)    Past Surgical History  Procedure Laterality Date  . Tarsal tunnel release Left 2012  . Bunionectomy  2004  . Shoulder surgery  2010, 2008  . Tubal ligation  1985  . Carpal tunnel release Left 06/15/2015    Procedure: LEFT CARPAL TUNNEL RELEASE;  Surgeon: Roseanne Kaufman, MD;  Location: [place];  Service: Orthopedics;  Laterality: Left;  . Carpal tunnel release Right 08/31/2015    Procedure: RIGHT LIMITED OPEN CARPAL TUNNEL RELEASE;  Surgeon: Roseanne Kaufman, MD;  Location:  [place];  Service: Orthopedics;  Laterality: Right;   No family history on file. Social History  Substance Use Topics  . Smoking status: Former Smoker    Types: Cigarettes  . Smokeless tobacco: None     Comment: Quit 35 years ago  . Alcohol Use: No   OB History    No data available     Review of Systems  Eyes: Negative for visual disturbance.  Respiratory: Negative for shortness of breath.   Gastrointestinal: Negative for nausea, vomiting, abdominal pain and diarrhea.  Musculoskeletal: Positive for back pain.  Neurological: Positive for dizziness and headaches. Negative for syncope, weakness and numbness.  All other systems reviewed and are negative.     Allergies  Ciprofloxacin and Avelox  Home Medications   Prior to Admission medications   Medication Sig Start Date End Date Taking? Authorizing Provider  acetaminophen (TYLENOL) 325 MG tablet Take 650 mg by mouth every 4 (four) hours as needed for moderate pain.   Yes Historical Provider, MD  Aspirin-Salicylamide-Caffeine (BC HEADACHE POWDER PO) Take 1 each by mouth daily as needed (headache.).   Yes Historical Provider, MD  diclofenac (VOLTAREN) 75 MG EC tablet Take 75 mg by mouth 2 (two) times daily.   Yes Historical Provider, MD  pantoprazole (PROTONIX) 40 MG tablet Take 40 mg by mouth daily. 02/01/16  Yes Historical Provider, MD  sertraline (ZOLOFT) 50 MG tablet Take 50 mg by mouth daily.   Yes  Historical Provider, MD   BP 129/75 mmHg  Pulse 56  Temp(Src) 97.9 F (36.6 C) (Oral)  Resp 14  SpO2 96% Physical Exam  Constitutional: She is oriented to person, place, and time. She appears well-developed and well-nourished.  HENT:  Head: Normocephalic and atraumatic.  Right Ear: Tympanic membrane, external ear and ear canal normal.  Left Ear: Tympanic membrane, external ear and ear canal normal.  Nose: Nose normal.  Eyes: EOM are normal. Pupils are equal, round, and reactive to light. Right eye  exhibits no discharge. Left eye exhibits no discharge.  Neck: Neck supple.  Cardiovascular: Normal rate, regular rhythm and normal heart sounds.   No murmur heard. HR 50s-60s  Pulmonary/Chest: Effort normal and breath sounds normal.  Abdominal: Soft. She exhibits no distension. There is no tenderness.  Neurological: She is alert and oriented to person, place, and time.  CN 2-12 grossly intact. 5/5 strength in all 4 extremities. Grossly normal sensation. Normal finger to nose. Normal gait  Skin: Skin is warm and dry.  Nursing note and vitals reviewed.   ED Course  Procedures (including critical care time) Labs Review Labs Reviewed  URINALYSIS, ROUTINE W REFLEX MICROSCOPIC (NOT AT [facility]) - Abnormal; Notable for the following:    Specific Gravity, Urine 1.004 (*)    All other components within normal limits  BASIC METABOLIC PANEL  CBC  CBG MONITORING, ED  Randolm Idol, ED    Imaging Review Mr Brain Wo Contrast (neuro Protocol)  04/15/2016  CLINICAL DATA:  55 year old female with acute onset nausea and lightheadedness at work today. Initial encounter. EXAM: MRI HEAD WITHOUT CONTRAST TECHNIQUE: Multiplanar, multiecho pulse sequences of the brain and surrounding structures were obtained without intravenous contrast. COMPARISON:  Cervical spine MRI 10/05/2015. FINDINGS: Cerebral volume is normal. No restricted diffusion to suggest acute infarction. No midline shift, mass effect, evidence of mass lesion, ventriculomegaly, extra-axial collection or acute intracranial hemorrhage. Cervicomedullary junction and pituitary are within normal limits. Major intracranial vascular flow voids are within normal limits. Pearline Cables and white matter signal is within normal limits for age throughout the brain. No cortical encephalomalacia or chronic cerebral blood products identified. Deep gray matter nuclei, brainstem, and cerebellum appear normal. Visible internal auditory structures appear normal. Mastoids are  clear. Paranasal sinuses are clear. Negative orbit soft tissues. Negative visualized cervical spine. Visualized bone marrow signal is within normal limits. 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 IMPRESSION: 1. No acute intracranial abnormality. Normal for age noncontrast MRI appearance of the brain. 2. Right occipital benign scalp lipoma. Electronically Signed   By: Genevie Ann M.D.   On: 04/15/2016 15:50   I have personally reviewed and evaluated these images and lab results as part of my medical decision-making.   EKG Interpretation   Date/Time:  Tuesday Apr 15 2016 12:06:55 EDT Ventricular Rate:  56 PR Interval:  142 QRS Duration: 77 QT Interval:  423 QTC Calculation: 408 R Axis:   30 Text Interpretation:  Sinus bradycardia Low voltage, precordial leads  Otherwise normal ECG No old tracing to compare Confirmed by Vitalia Stough MD,  Shontez Sermon [phone]) on 04/15/2016 1:02:55 PM      MDM   Final diagnoses:  Dizziness    No clear pathology for patient's dizziness. She is bradycardic but this does not appear to be new and no signs of conduction delay. She is able to walk without difficulty. MRI negative for CVA. She already knows about the occipital  lipoma. I doubt this is cardiac. Likely peripheral vertigo. She feels improved. Will d/c with antivert, encourage increase fluids and discussed return precautions. F/u with PCP    Sherwood Gambler, MD 04/15/16 2231

## 2016-04-15 NOTE — ED Notes (Signed)
Pt reports she was at work at the [employer] today, when she started to have lightheadedness and nausea.  Denies any cp or SOB at this time. Pt reports L jaw pain and upper back pain x 1 week ago which is now resolved.  But reports back pain is intermittent.

## 2016-04-15 NOTE — ED Notes (Signed)
Patient transported to MRI 

## 2016-04-17 MED FILL — PANTOPRAZOLE SOD DR 40 MG T: 40 | 30 days supply | Qty: 60 | Fill #1

## 2016-04-17 MED FILL — DICLOFENAC SOD EC 75 MG TAB: 75 | 30 days supply | Qty: 60 | Fill #1

## 2016-04-18 MED FILL — SERTRALINE HCL 100 MG TAB: 100 | 30 days supply | Qty: 30 | Fill #3

## 2016-04-23 MED FILL — MELOXICAM 15 MG TABLET: 15 | 30 days supply | Qty: 30 | Fill #1

## 2016-04-25 DIAGNOSIS — M5441 Lumbago with sciatica, right side: Secondary | ICD-10-CM | POA: Diagnosis not present

## 2016-04-25 DIAGNOSIS — G8929 Other chronic pain: Secondary | ICD-10-CM | POA: Diagnosis not present

## 2016-04-25 DIAGNOSIS — M7062 Trochanteric bursitis, left hip: Secondary | ICD-10-CM | POA: Diagnosis not present

## 2016-04-25 DIAGNOSIS — M5136 Other intervertebral disc degeneration, lumbar region: Secondary | ICD-10-CM | POA: Diagnosis not present

## 2016-04-25 MED FILL — predniSONE 50 MG TABS: 50 | 7 days supply | Qty: 7 | Fill #0

## 2016-04-28 MED FILL — ZOLPIDEM TARTRATE 5 MG TAB: 5 | 30 days supply | Qty: 30 | Fill #1

## 2016-05-07 ENCOUNTER — Encounter: Payer: Self-pay | Admitting: Physical Therapy

## 2016-05-07 ENCOUNTER — Ambulatory Visit: Payer: 59 | Attending: Physician Assistant | Admitting: Physical Therapy

## 2016-05-07 DIAGNOSIS — M5441 Lumbago with sciatica, right side: Secondary | ICD-10-CM | POA: Diagnosis not present

## 2016-05-07 DIAGNOSIS — R293 Abnormal posture: Secondary | ICD-10-CM | POA: Insufficient documentation

## 2016-05-07 DIAGNOSIS — M6281 Muscle weakness (generalized): Secondary | ICD-10-CM | POA: Insufficient documentation

## 2016-05-07 NOTE — Therapy (Signed)
[place], [place], [address] Phone: [phone]   Fax:  [phone]  Physical Therapy Evaluation  Patient Details  Name: Priscilla Underwood MRN: [id number] Date of Birth: Jun 03, 1961 Referring Provider: Dr Nelva Bush  Encounter Date: 05/07/2016      PT End of Session - 05/07/16 1435    Visit Number 1   Number of Visits 16   Date for PT Re-Evaluation 07/02/16   Authorization Type     PT Start Time 0845   PT Stop Time 0930   PT Time Calculation (min) 45 min   Activity Tolerance Patient tolerated treatment well   Behavior During Therapy [facility] for tasks assessed/performed      Past Medical History  Diagnosis Date  . GERD (gastroesophageal reflux disease)   . Depression   . Arthritis     osteo arthritis  . Fibromyalgia   . PONV (postoperative nausea and vomiting)     Past Surgical History  Procedure Laterality Date  . Tarsal tunnel release Left 2012  . Bunionectomy  2004  . Shoulder surgery  2010, 2008  . Tubal ligation  1985  . Carpal tunnel release Left 06/15/2015    Procedure: LEFT CARPAL TUNNEL RELEASE;  Surgeon: Roseanne Kaufman, MD;  Location: [place];  Service: Orthopedics;  Laterality: Left;  . Carpal tunnel release Right 08/31/2015    Procedure: RIGHT LIMITED OPEN CARPAL TUNNEL RELEASE;  Surgeon: Roseanne Kaufman, MD;  Location: [place];  Service: Orthopedics;  Laterality: Right;    There were no vitals filed for this visit.       Subjective Assessment - 05/07/16 0856    Subjective Patient comes in today with 7/10 sciatic pain. The pain gores al the way to her foot. The pain is worse when she sits and when she drives for a long period of time.    Pertinent History Past history of lower back and neck pain.    How long can you sit comfortably? > 10 min    How long can you stand comfortably? > 20 minutes    How long can you walk comfortably? > 30  minutes increase the soreness    Diagnostic tests MRI lumbar: L3-4: Mild facet degeneration with 2 mm of anterolisthesis. Mild       Patient Stated Goals Less pain and improve stregth/ ability to walk.    Currently in Pain? Yes   Multiple Pain Sites No            [facility] PT Assessment - 05/07/16 0001    Assessment   Medical Diagnosis Right sided Sciatica   Referring Provider Dr Nelva Bush   Next MD Visit 3 weeks but may change    Prior Therapy Years prior    Precautions   Precautions None   Restrictions   Weight Bearing Restrictions No   Balance Screen   Has the patient fallen in the past 6 months No   Home Environment   Additional Comments Steps in the house and going into the house    Prior Function   Level of Independence Independent   Observation/Other Assessments   Focus on Therapeutic Outcomes (FOTO)  39% limited    Sensation   Additional Comments Radicular pain down the back of her right leg into her right foot.    ROM / Strength   AROM / PROM / Strength AROM;PROM;Strength   AROM   Overall AROM Comments Pain with lumbar flexion and extension  AROM Assessment Site Lumbar   Lumbar Flexion 60   Lumbar Extension 30   Lumbar - Right Rotation 35   Lumbar - Left Rotation 35   Strength   Overall Strength Comments fair core contraction    Strength Assessment Site Hip;Knee;Ankle   Right/Left Hip Right;Left   Right Hip Flexion 4+/5   Right Hip Extension 4+/5   Right Hip External Rotation  5/5   Right Hip Internal Rotation 5/5   Right Hip ABduction 5/5   Left Hip Flexion 5/5   Left Hip Extension 5/5   Left Hip External Rotation 5/5   Left Hip Internal Rotation 5/5   Left Hip ABduction 5/5   Left Hip ADduction 5/5   Right/Left Knee Right;Left   Right Knee Flexion 5/5   Right Knee Extension 5/5   Left Knee Flexion 5/5   Left Knee Extension 5/5   Right/Left Ankle Right;Left   Right Ankle Dorsiflexion 5/5   Left Ankle Dorsiflexion 5/5   Flexibility   Soft Tissue  Assessment /Muscle Length yes   Hamstrings 25 degrees limitation on the R 15 on the L    Palpation   Palpation comment decreased passive intervertebral motion from L3 to L5; tenderness to aplation around the piriformis area and the upper buttock;    Special Tests    Special Tests --  Straight leg raise + R at 45 degrees    Ambulation/Gait   Gait Comments Decreased hip rotation; decreased right hip flexion;                              PT Short Term Goals - 05/07/16 1501    PT SHORT TERM GOAL #1   Title Patient will report centralized lower back pain    Baseline radiating pain into her right leg    Time 8   Period Weeks   Status New   PT SHORT TERM GOAL #2   Title Patient will increase lumbar flexion by 20 degrees without increase pain    Baseline 60 degrees with pain    Time 8   Period Weeks   PT SHORT TERM GOAL #3   Title Patient will perfrom good abdominal contraction    Baseline fair contraction    Time 8   Period Weeks   Status New   PT SHORT TERM GOAL #4   Title Patient will be I w/ initial HEP for core strength and stability    Time 8   Period Weeks   Status New           PT Long Term Goals - 05/07/16 1506    PT LONG TERM GOAL #1   Title Patient will sit for 2 hours without increased pain in order to perfrom work tasks    Time 8   Period Weeks   Status New   PT LONG TERM GOAL #2   Title Patient will stand for 1 hour in order to perfrom IADL's    Time 8   Period Weeks   Status New   PT LONG TERM GOAL #3   Title Patient will be I w/ HEP to prevent furter ocourances of lower back and sciativ pain    Time 8   Period Weeks   Status New               Plan - 05/07/16 1438    Clinical Impression Statement Patient is a 55 year old female  with right sided sciatica. Her MRI from 2015 shows mld disc buldges at multiple level. She has tenderness around her piriformis area and no reproduction of pain with lateral shift. Her symptoms  lean more towards piriformis syndorme then a significant disc buldge with nerve root impingement. She had mobility limitations in her spine but no significant reproduction of pain with direction. She has core weakness. She would benefit from skilled therapy to adress the above deficits. She iwas seen today for a low complexity evaluation.    Rehab Potential Good   PT Frequency 2x / week   PT Duration 8 weeks   PT Treatment/Interventions ADLs/Self Care Home Management;Cryotherapy;Electrical Stimulation;Moist Heat;Traction;Manual techniques;Patient/family education;Dry needling;Gait training;Functional mobility training;Therapeutic exercise;Therapeutic activities   PT Next Visit Plan review stretches; begin light core strengthening; consider clamshells with TA, bridging, mini squats, double knee to chest with ball; Modalities as needed. Discusss needling.    PT Home Exercise Plan piriformis stretch; hip ER stretch, single knee to chest stretch; thomas stretch; 90/90 hamstriing stretch,    Consulted and Agree with Plan of Care Patient      Patient will benefit from skilled therapeutic intervention in order to improve the following deficits and impairments:  Decreased mobility, Decreased activity tolerance, Pain, Impaired flexibility, Improper body mechanics, Postural dysfunction  Visit Diagnosis: Right-sided low back pain with right-sided sciatica - Plan: PT plan of care cert/re-cert  Muscle weakness (generalized) - Plan: PT plan of care cert/re-cert  Abnormal posture - Plan: PT plan of care cert/re-cert     Problem List There are no active problems to display for this patient.   Carney Living  PT DPT   05/07/2016, 4:10 PM  [facility] [address] [place], [place], [address] Phone: [phone]   Fax:  [phone]  Name: MARZETTA LOFT MRN: [id number] Date of Birth: 1961/04/16

## 2016-05-13 ENCOUNTER — Ambulatory Visit: Payer: 59 | Attending: Physician Assistant | Admitting: Physical Therapy

## 2016-05-13 DIAGNOSIS — R293 Abnormal posture: Secondary | ICD-10-CM | POA: Insufficient documentation

## 2016-05-13 DIAGNOSIS — M5441 Lumbago with sciatica, right side: Secondary | ICD-10-CM | POA: Diagnosis not present

## 2016-05-13 DIAGNOSIS — M6281 Muscle weakness (generalized): Secondary | ICD-10-CM | POA: Diagnosis not present

## 2016-05-13 NOTE — Patient Instructions (Signed)
Consider exercising in gym at work prior to going home.

## 2016-05-13 NOTE — Therapy (Signed)
[place], [place], [address] Phone: [phone]   Fax:  [phone]  Physical Therapy Treatment  Patient Details  Name: Priscilla Underwood MRN: [id number] Date of Birth: 29-Mar-1961 Referring Provider: Dr Nelva Bush  Encounter Date: 05/13/2016      PT End of Session - 05/13/16 1304    Visit Number 2   Number of Visits 16   Date for PT Re-Evaluation 07/02/16   PT Start Time 1150   PT Stop Time 1250   PT Time Calculation (min) 60 min   Activity Tolerance Patient tolerated treatment well   Behavior During Therapy [facility] for tasks assessed/performed      Past Medical History  Diagnosis Date  . GERD (gastroesophageal reflux disease)   . Depression   . Arthritis     osteo arthritis  . Fibromyalgia   . PONV (postoperative nausea and vomiting)     Past Surgical History  Procedure Laterality Date  . Tarsal tunnel release Left 2012  . Bunionectomy  2004  . Shoulder surgery  2010, 2008  . Tubal ligation  1985  . Carpal tunnel release Left 06/15/2015    Procedure: LEFT CARPAL TUNNEL RELEASE;  Surgeon: Roseanne Kaufman, MD;  Location: [place];  Service: Orthopedics;  Laterality: Left;  . Carpal tunnel release Right 08/31/2015    Procedure: RIGHT LIMITED OPEN CARPAL TUNNEL RELEASE;  Surgeon: Roseanne Kaufman, MD;  Location: [place];  Service: Orthopedics;  Laterality: Right;    There were no vitals filed for this visit.      Subjective Assessment - 05/13/16 1153    Subjective Tight.  Has not done her home exercises.  Pain 9l/10   Currently in Pain? Yes   Pain Score 7    Pain Location Head   Pain Orientation Left   Pain Descriptors / Indicators Headache  headach,  pain with turning head,  headaches every day   Pain Type Chronic pain   Pain Frequency Constant   Multiple Pain Sites Yes  back pain    Pain Location Back   Pain Orientation Right   Pain Descriptors / Indicators  Aching;Spasm;Squeezing;Constant   Pain Radiating Towards Right ankle, anterior thigh   Pain Onset Other (comment)   Pain Frequency Constant                         [facility] Adult PT Treatment/Exercise - 05/13/16 0001    Lumbar Exercises: Stretches   Passive Hamstring Stretch 3 reps;30 seconds  uncomfortable 7/10   Single Knee to Chest Stretch 5 reps   Single Knee to Chest Stretch Limitations Also  PROM IR stretch   Lower Trunk Rotation --  10   Piriformis Stretch 3 reps;30 seconds  both   Lumbar Exercises: Supine   Bridge 10 reps   Other Supine Lumbar Exercises TRA with breathing  5 X  cues   Moist Heat Therapy   Number Minutes Moist Heat 15 Minutes   Moist Heat Location --  lowback/hip right   Electrical Stimulation   Electrical Stimulation Location lowback, hip right   Electrical Stimulation Action IFC   Electrical Stimulation Parameters 12   Electrical Stimulation Goals Pain  sidelying Left                PT Education - 05/13/16 1251    Education provided Yes   Education Details minor cues with current home exercises.   Person(s) Educated Patient  Methods Explanation;Verbal cues   Comprehension Verbalized understanding          PT Short Term Goals - 05/13/16 1316    PT SHORT TERM GOAL #1   Title Patient will report centralized lower back pain    Baseline radiating pain into her right leg    Time 8   Period Weeks   Status On-going   PT SHORT TERM GOAL #2   Title Patient will increase lumbar flexion by 20 degrees without increase pain    Time 8   Period Weeks   Status Unable to assess   PT SHORT TERM GOAL #3   Title Patient will perfrom good abdominal contraction    Time 8   Period Weeks   Status On-going   PT SHORT TERM GOAL #4   Title Patient will be I w/ initial HEP for core strength and stability    Baseline needs cues,  non adherent.   Time 8   Period Weeks   Status On-going           PT Long Term Goals - 05/07/16  1506    PT LONG TERM GOAL #1   Title Patient will sit for 2 hours without increased pain in order to perfrom work tasks    Time 8   Period Weeks   Status New   PT LONG TERM GOAL #2   Title Patient will stand for 1 hour in order to perfrom IADL's    Time 8   Period Weeks   Status New   PT LONG TERM GOAL #3   Title Patient will be I w/ HEP to prevent furter ocourances of lower back and sciativ pain    Time 8   Period Weeks   Status New               Plan - 05/13/16 1305    Clinical Impression Statement Some pain increase with stretching.  Patient had not been doing her home exercises however she has agreed to make time after work using the gym at work. No new goals met,  Pain with hamstring stretch 7/10 today.     PT Next Visit Plan Check to see if she has been doing her home exercises.  Stretch  Transversus abdominus work. Assess modalities.   PT Home Exercise Plan piriformis stretch; hip ER stretch, single knee to chest stretch; thomas stretch; 90/90 hamstriing stretch, continue no new added due to patient not yet in the habit of doing.   Consulted and Agree with Plan of Care Patient      Patient will benefit from skilled therapeutic intervention in order to improve the following deficits and impairments:  Decreased mobility, Decreased activity tolerance, Pain, Impaired flexibility, Improper body mechanics, Postural dysfunction  Visit Diagnosis: Right-sided low back pain with right-sided sciatica  Muscle weakness (generalized)  Abnormal posture     Problem List There are no active problems to display for this patient.   [facility] 05/13/2016, 1:18 PM  [facility] [address] [place], [place], [address] Phone: [phone]   Fax:  [phone]  Name: Priscilla Underwood MRN: [id number] Date of Birth: 1960-12-22    Melvenia Needles, PTA 05/13/2016 1:18 PM Phone: [phone] Fax: [phone]

## 2016-05-15 MED FILL — SERTRALINE HCL 100 MG TAB: 100 | 30 days supply | Qty: 30 | Fill #4

## 2016-05-16 ENCOUNTER — Ambulatory Visit: Payer: 59 | Admitting: Physical Therapy

## 2016-05-16 DIAGNOSIS — M5441 Lumbago with sciatica, right side: Secondary | ICD-10-CM

## 2016-05-16 DIAGNOSIS — R293 Abnormal posture: Secondary | ICD-10-CM

## 2016-05-16 DIAGNOSIS — M6281 Muscle weakness (generalized): Secondary | ICD-10-CM | POA: Diagnosis not present

## 2016-05-16 NOTE — Therapy (Signed)
[place], [place], [address] Phone: [phone]   Fax:  [phone]  Physical Therapy Treatment  Patient Details  Name: Priscilla Underwood MRN: [id number] Date of Birth: Nov 22, 1961 Referring Provider: Dr Nelva Bush  Encounter Date: 05/16/2016      PT End of Session - 05/16/16 0904    Visit Number 3   Number of Visits 16   Date for PT Re-Evaluation 07/02/16   Authorization Type [place]    PT Start Time [phone]   PT Stop Time 0930   PT Time Calculation (min) 43 min   Activity Tolerance Patient tolerated treatment well   Behavior During Therapy [facility] for tasks assessed/performed      Past Medical History  Diagnosis Date  . GERD (gastroesophageal reflux disease)   . Depression   . Arthritis     osteo arthritis  . Fibromyalgia   . PONV (postoperative nausea and vomiting)     Past Surgical History  Procedure Laterality Date  . Tarsal tunnel release Left 2012  . Bunionectomy  2004  . Shoulder surgery  2010, 2008  . Tubal ligation  1985  . Carpal tunnel release Left 06/15/2015    Procedure: LEFT CARPAL TUNNEL RELEASE;  Surgeon: Roseanne Kaufman, MD;  Location: [place];  Service: Orthopedics;  Laterality: Left;  . Carpal tunnel release Right 08/31/2015    Procedure: RIGHT LIMITED OPEN CARPAL TUNNEL RELEASE;  Surgeon: Roseanne Kaufman, MD;  Location: [place];  Service: Orthopedics;  Laterality: Right;    There were no vitals filed for this visit.      Subjective Assessment - 05/16/16 0852    Subjective Patients pain is better then the last visit but she is still having sciatic pain down into her right lwg. Her pain is about a 4/10 today. She had throbbing in the middle of her back after doing house work on Wednesday.    Pertinent History Past history of lower back and neck pain.    How long can you sit comfortably? > 10 min    How long can you stand comfortably? > 20 minutes     How long can you walk comfortably? > 30 minutes increase the soreness    Diagnostic tests MRI lumbar: L3-4: Mild facet degeneration with 2 mm of anterolisthesis. Mild       Patient Stated Goals Less pain and improve stregth/ ability to walk.    Currently in Pain? Yes   Pain Score 7    Pain Location Back   Pain Orientation Right   Pain Descriptors / Indicators Aching   Pain Type Chronic pain   Pain Frequency Constant   Aggravating Factors  houde work    Pain Relieving Factors rest    Effect of Pain on Daily Activities difficulty sitting in the car and difficulty with housework.    Multiple Pain Sites No                         [facility] Adult PT Treatment/Exercise - 05/16/16 0001    Lumbar Exercises: Stretches   Passive Hamstring Stretch 3 reps;30 seconds  uncomfortable 7/10   Single Knee to Chest Stretch 5 reps   Single Knee to Chest Stretch Limitations Also  PROM IR stretch   Piriformis Stretch 3 reps;30 seconds  both   Lumbar Exercises: Prone   Other Prone Lumbar Exercises Mckenzie prone on elbows 2 min; Mckenziepress up 3x5; Mckenzie prone  press-up with over pressure 3x5; Patient reported some releif with press up with over pressure.    Moist Heat Therapy   Number Minutes Moist Heat 10 Minutes   Moist Heat Location --  lowback/hip right   Electrical Stimulation   Electrical Stimulation Location lowback, hip right   Electrical Stimulation Goals Pain  sidelying Left   Manual Therapy   Manual therapy comments Soft tissue release to right lumbar spine/ SI                 PT Education - 05/16/16 0904    Education Details continue with stretches and home exercises.    Person(s) Educated Patient   Methods Explanation   Comprehension Verbalized understanding;Returned demonstration          PT Short Term Goals - 05/13/16 1316    PT SHORT TERM GOAL #1   Title Patient will report centralized lower back pain    Baseline radiating pain into her right  leg    Time 8   Period Weeks   Status On-going   PT SHORT TERM GOAL #2   Title Patient will increase lumbar flexion by 20 degrees without increase pain    Time 8   Period Weeks   Status Unable to assess   PT SHORT TERM GOAL #3   Title Patient will perfrom good abdominal contraction    Time 8   Period Weeks   Status On-going   PT SHORT TERM GOAL #4   Title Patient will be I w/ initial HEP for core strength and stability    Baseline needs cues,  non adherent.   Time 8   Period Weeks   Status On-going           PT Long Term Goals - 05/07/16 1506    PT LONG TERM GOAL #1   Title Patient will sit for 2 hours without increased pain in order to perfrom work tasks    Time 8   Period Weeks   Status New   PT LONG TERM GOAL #2   Title Patient will stand for 1 hour in order to perfrom IADL's    Time 8   Period Weeks   Status New   PT LONG TERM GOAL #3   Title Patient will be I w/ HEP to prevent furter ocourances of lower back and sciativ pain    Time 8   Period Weeks   Status New               Plan - 05/16/16 1223    Clinical Impression Statement Patient reported some improvement in pain with prone mckenzie exercises. She was also given some standing core exercises to do at home. Therapy will continue to progress the patient as tolerated. Patient encouraged to get a lumbar roll for her car and work.    Rehab Potential Good   PT Treatment/Interventions ADLs/Self Care Home Management;Cryotherapy;Electrical Stimulation;Moist Heat;Traction;Manual techniques;Patient/family education;Dry needling;Gait training;Functional mobility training;Therapeutic exercise;Therapeutic activities   PT Next Visit Plan Check to see if she has been doing her home exercises.  Stretch  Transversus abdominus work. Assess modalities.   PT Home Exercise Plan piriformis stretch; hip ER stretch, single knee to chest stretch; thomas stretch; 90/90 hamstriing stretch, continue no new added due to patient  not yet in the habit of doing.   Consulted and Agree with Plan of Care Patient      Patient will benefit from skilled therapeutic intervention in order to improve the following deficits  and impairments:  Decreased mobility, Decreased activity tolerance, Pain, Impaired flexibility, Improper body mechanics, Postural dysfunction  Visit Diagnosis: Right-sided low back pain with right-sided sciatica  Muscle weakness (generalized)  Abnormal posture     Problem List There are no active problems to display for this patient.   Carney Living PT DPT  05/16/2016, 12:25 PM  [facility] [address] [place], [place], [address] Phone: [phone]   Fax:  [phone]  Name: SHANNAY DINGER MRN: [id number] Date of Birth: 1961/06/22

## 2016-05-19 ENCOUNTER — Ambulatory Visit: Payer: 59 | Admitting: Physical Therapy

## 2016-05-19 DIAGNOSIS — M6281 Muscle weakness (generalized): Secondary | ICD-10-CM

## 2016-05-19 DIAGNOSIS — M5441 Lumbago with sciatica, right side: Secondary | ICD-10-CM

## 2016-05-19 DIAGNOSIS — R293 Abnormal posture: Secondary | ICD-10-CM

## 2016-05-19 NOTE — Therapy (Signed)
[facility] [address] [place], [place], [address] Phone: [phone]   Fax:  [phone]  Physical Therapy Treatment  Patient Details  Name: Priscilla FerrisBrenda L Underwood MRN: [id number] Date of Birth: 22-Jun-1961 Referring Provider: Dr Ethelene Halamos  Encounter Date: 05/19/2016      PT End of Session - 05/19/16 0901    Visit Number 4   Number of Visits 16   Date for PT Re-Evaluation 07/02/16   Authorization Type [employer]    PT Start Time 0845   PT Stop Time 0935   PT Time Calculation (min) 50 min   Activity Tolerance Patient tolerated treatment well   Behavior [facility] for tasks assessed/performed      Past Medical History  Diagnosis Date  . GERD (gastroesophageal reflux disease)   . Depression   . Arthritis     osteo arthritis  . Fibromyalgia   . PONV (postoperative nausea and vomiting)     Past Surgical History  Procedure Laterality Date  . Tarsal tunnel release Left 2012  . Bunionectomy  2004  . Shoulder surgery  2010, 2008  . Tubal ligation  1985  . Carpal tunnel release Left 06/15/2015    Procedure: LEFT CARPAL TUNNEL RELEASE;  Surgeon: Dominica SeverinWilliam Gramig, MD;  Location: [facility];  Service: Orthopedics;  Laterality: Left;  . Carpal tunnel release Right 08/31/2015    Procedure: RIGHT LIMITED OPEN CARPAL TUNNEL RELEASE;  Surgeon: Dominica SeverinWilliam Gramig, MD;  Location: [facility];  Service: Orthopedics;  Laterality: Right;    There were no vitals filed for this visit.      Subjective Assessment - 05/19/16 0853    Subjective Patient reports her pain today is probably around a 9/10. She was advised to not do anything stressful over the weekend but she said she was not able to do that. She also did not do her exercises. Therapy advised her that if she does not do her exercises at home her back will likelly not improve.    Pertinent History Past history of lower back and neck pain.    How long can  you sit comfortably? > 10 min    How long can you stand comfortably? > 20 minutes    How long can you walk comfortably? > 30 minutes increase the soreness    Diagnostic tests MRI lumbar: L3-4: Mild facet degeneration with 2 mm of anterolisthesis. Mild       Patient Stated Goals Less pain and improve stregth/ ability to walk.    Pain Score 9    Pain Location Back   Pain Orientation Right   Pain Descriptors / Indicators Aching   Pain Type Chronic pain   Pain Frequency Constant   Aggravating Factors  house work    Pain Relieving Factors rest    Pain Onset More than a month ago   Pain Frequency Constant   Aggravating Factors  activity    Pain Relieving Factors rest    Effect of Pain on Daily Activities difficulty cleaning and perfroming daily activity                          [facility] Adult PT Treatment/Exercise - 05/19/16 0001    Lumbar Exercises: Stretches   Passive Hamstring Stretch 3 reps;30 seconds  uncomfortable 7/10   Single Knee to Chest Stretch Limitations 2x20sec holds    Piriformis Stretch 3 reps;30 seconds  both   Lumbar Exercises: Supine  Clam Limitations 2x10 yellow    Bent Knee Raise Limitations 2x10   Bridge 10 reps   Other Supine Lumbar Exercises Double knee to chest with ball 2x10 black ball    Lumbar Exercises: Prone   Other Prone Lumbar Exercises Mckenzie prone on elbows 2 min; Mckenziepress up 3x5; Mckenzie prone press-up with over pressure 3x5; Patient reported some releif with press up with over pressure.    Moist Heat Therapy   Number Minutes Moist Heat 15 Minutes   Moist Heat Location --  lowback/hip right   Electrical Stimulation   Electrical Stimulation Location lowback, hip right   Electrical Stimulation Action IFC   Electrical Stimulation Parameters 15   Electrical Stimulation Goals Pain  sidelying Left   Manual Therapy   Manual therapy comments IASTYM to cervical spine                 PT Education - 05/19/16 0858     Education Details extensive education privded on the improtance of doing her home exercises.    Person(s) Educated Patient   Methods Explanation   Comprehension Returned demonstration;Need further instruction          PT Short Term Goals - 05/19/16 1624    PT SHORT TERM GOAL #1   Title Patient will report centralized lower back pain    Baseline radiating pain into her right leg    Time 8   Period Weeks   Status On-going   PT SHORT TERM GOAL #2   Title Patient will increase lumbar flexion by 20 degrees without increase pain    Baseline 60 degrees with pain    Time 8   Period Weeks   Status Unable to assess   PT SHORT TERM GOAL #3   Title Patient will perfrom good abdominal contraction    Time 8   Period Weeks   Status On-going   PT SHORT TERM GOAL #4   Title Patient will be I w/ initial HEP for core strength and stability    Baseline needs cues,  non adherent.   Time 8   Period Weeks   Status On-going           PT Long Term Goals - 05/07/16 1506    PT LONG TERM GOAL #1   Title Patient will sit for 2 hours without increased pain in order to perfrom work tasks    Time 8   Period Weeks   Status New   PT LONG TERM GOAL #2   Title Patient will stand for 1 hour in order to perfrom IADL's    Time 8   Period Weeks   Status New   PT LONG TERM GOAL #3   Title Patient will be I w/ HEP to prevent furter ocourances of lower back and sciativ pain    Time 8   Period Weeks   Status New               Plan - 05/19/16 1621    Clinical Impression Statement Patient continues to have singnifincat pain but she has not been doing her exercises or following advice to not do activity that causes significant pain. The patient was advised if she continues to come in with 9/10 pain we will have to send her back to the MD. If she has not done her PT we will not have an acurate idea if PT is beneficial. She reports she will begin doing her exercises. She has not reached any goals  at  this time.    Rehab Potential Good   PT Frequency 2x / week   PT Duration 8 weeks   PT Treatment/Interventions ADLs/Self Care Home Management;Cryotherapy;Electrical Stimulation;Moist Heat;Traction;Manual techniques;Patient/family education;Dry needling;Gait training;Functional mobility training;Therapeutic exercise;Therapeutic activities   PT Next Visit Plan Check to see if she has been doing her home exercises.  Stretch  Transversus abdominus work. Assess modalities.   PT Home Exercise Plan piriformis stretch; hip ER stretch, single knee to chest stretch; thomas stretch; 90/90 hamstriing stretch, continue no new added due to patient not yet in the habit of doing.   Consulted and Agree with Plan of Care Patient      Patient will benefit from skilled therapeutic intervention in order to improve the following deficits and impairments:  Decreased mobility, Decreased activity tolerance, Pain, Impaired flexibility, Improper body mechanics, Postural dysfunction  Visit Diagnosis: Right-sided low back pain with right-sided sciatica  Muscle weakness (generalized)  Abnormal posture     Problem List There are no active problems to display for this patient.   Carney Living  PT DPT   05/19/2016, 4:25 PM  [facility] [address] [place], [place], [address] Phone: [phone]   Fax:  [phone]  Name: Priscilla Underwood MRN: [id number] Date of Birth: 11-26-1961

## 2016-05-22 ENCOUNTER — Ambulatory Visit: Payer: 59 | Admitting: Physical Therapy

## 2016-05-22 DIAGNOSIS — R293 Abnormal posture: Secondary | ICD-10-CM | POA: Diagnosis not present

## 2016-05-22 DIAGNOSIS — M6281 Muscle weakness (generalized): Secondary | ICD-10-CM | POA: Diagnosis not present

## 2016-05-22 DIAGNOSIS — M5441 Lumbago with sciatica, right side: Secondary | ICD-10-CM

## 2016-05-22 NOTE — Therapy (Addendum)
[place], [place], [address] Phone: [phone]   Fax:  [phone]  Physical Therapy Treatment  Patient Details  Name: Priscilla Underwood MRN: [id number] Date of Birth: May 21, 1961 Referring Provider: Dr Nelva Bush  Encounter Date: 05/22/2016      PT End of Session - 05/22/16 0813    Visit Number 5   Number of Visits 16   Date for PT Re-Evaluation 07/02/16   Authorization Type [employer]    PT Start Time 0800   PT Stop Time 0840   PT Time Calculation (min) 40 min   Activity Tolerance Patient tolerated treatment well   Behavior During Therapy [facility] for tasks assessed/performed      Past Medical History  Diagnosis Date  . GERD (gastroesophageal reflux disease)   . Depression   . Arthritis     osteo arthritis  . Fibromyalgia   . PONV (postoperative nausea and vomiting)     Past Surgical History  Procedure Laterality Date  . Tarsal tunnel release Left 2012  . Bunionectomy  2004  . Shoulder surgery  2010, 2008  . Tubal ligation  1985  . Carpal tunnel release Left 06/15/2015    Procedure: LEFT CARPAL TUNNEL RELEASE;  Surgeon: Roseanne Kaufman, MD;  Location: [facility];  Service: Orthopedics;  Laterality: Left;  . Carpal tunnel release Right 08/31/2015    Procedure: RIGHT LIMITED OPEN CARPAL TUNNEL RELEASE;  Surgeon: Roseanne Kaufman, MD;  Location: [facility];  Service: Orthopedics;  Laterality: Right;    There were no vitals filed for this visit.      Subjective Assessment - 05/22/16 0802    Subjective Patient continues to have 8 or 9 out of 10 pain. The pain is running down her right leg. She feels like her disc area is throbbing. Therapy will send her back to the MD.    Pertinent History Past history of lower back and neck pain.    How long can you sit comfortably? > 10 min    How long can you stand comfortably? > 20 minutes    How long can you walk comfortably? > 30  minutes increase the soreness    Diagnostic tests MRI lumbar: L3-4: Mild facet degeneration with 2 mm of anterolisthesis. Mild       Patient Stated Goals Less pain and improve stregth/ ability to walk.    Currently in Pain? Yes   Pain Location Back   Pain Orientation Right   Pain Descriptors / Indicators Aching   Pain Type Chronic pain   Pain Frequency Constant   Aggravating Factors  house work    Pain Relieving Factors rest    Effect of Pain on Daily Activities difficulty sitting in a car                          [facility] Adult PT Treatment/Exercise - 05/22/16 0001    Lumbar Exercises: Stretches   Passive Hamstring Stretch 3 reps;30 seconds  uncomfortable 7/10   Single Knee to Chest Stretch Limitations 2x20sec holds    Piriformis Stretch 3 reps;30 seconds  both   Lumbar Exercises: Supine   Bent Knee Raise Limitations 2x10   Moist Heat Therapy   Moist Heat Location --  lowback/hip right   Electrical Stimulation   Electrical Stimulation Location lowback, hip right   Electrical Stimulation Action IFC   Electrical Stimulation Parameters 15   Electrical Stimulation Goals Pain  sidelying Left   Manual Therapy   Manual therapy comments IASTYM to cervical spine                 PT Education - 05/22/16 3212    Education Details continue with stretching and light strengthening at home.    Person(s) Educated Patient   Methods Explanation   Comprehension Verbalized understanding;Returned demonstration          PT Short Term Goals - 05/19/16 1624    PT SHORT TERM GOAL #1   Title Patient will report centralized lower back pain    Baseline radiating pain into her right leg    Time 8   Period Weeks   Status On-going   PT SHORT TERM GOAL #2   Title Patient will increase lumbar flexion by 20 degrees without increase pain    Baseline 60 degrees with pain    Time 8   Period Weeks   Status Unable to assess   PT SHORT TERM GOAL #3   Title Patient will  perfrom good abdominal contraction    Time 8   Period Weeks   Status On-going   PT SHORT TERM GOAL #4   Title Patient will be I w/ initial HEP for core strength and stability    Baseline needs cues,  non adherent.   Time 8   Period Weeks   Status On-going           PT Long Term Goals - 05/07/16 1506    PT LONG TERM GOAL #1   Title Patient will sit for 2 hours without increased pain in order to perfrom work tasks    Time 8   Period Weeks   Status New   PT LONG TERM GOAL #2   Title Patient will stand for 1 hour in order to perfrom IADL's    Time 8   Period Weeks   Status New   PT LONG TERM GOAL #3   Title Patient will be I w/ HEP to prevent furter ocourances of lower back and sciativ pain    Time 8   Period Weeks   Status New               Plan - 05/22/16 0831    Clinical Impression Statement Patient continues to have significant pain. stretching, manual therapy, and light strengthening have had no effect. she feels like her pain is getting worse. Therapy recommends  that she go back to the MD for furter evaluation. Therapy will be put on hold at this time until furter reccomendations from the PT are recieved. . Limited treatment performed today 2nd to increased pain with activity. Patient advised to keep stretching.    Rehab Potential Good   PT Frequency 2x / week   PT Duration 8 weeks   PT Treatment/Interventions ADLs/Self Care Home Management;Cryotherapy;Electrical Stimulation;Moist Heat;Traction;Manual techniques;Patient/family education;Dry needling;Gait training;Functional mobility training;Therapeutic exercise;Therapeutic activities   PT Next Visit Plan Check to see if she has been doing her home exercises.  Stretch  Transversus abdominus work. Assess modalities.   PT Home Exercise Plan piriformis stretch; hip ER stretch, single knee to chest stretch; thomas stretch; 90/90 hamstriing stretch, continue no new added due to patient not yet in the habit of doing.    Consulted and Agree with Plan of Care Patient      Patient will benefit from skilled therapeutic intervention in order to improve the following deficits and impairments:  Decreased mobility, Decreased activity tolerance, Pain, Impaired flexibility,  Improper body mechanics, Postural dysfunction  Visit Diagnosis: Right-sided low back pain with right-sided sciatica  Muscle weakness (generalized)  Abnormal posture    PHYSICAL THERAPY DISCHARGE SUMMARY  Visits from Start of Care:   Current functional level related to goals / functional outcomes: Did not return    Remaining deficits: Did not return    Education / Equipment:Did not return  Plan: Patient agrees to discharge.  Patient goals were not met. Patient is being discharged due to meeting the stated rehab goals.  ?????      Problem List There are no active problems to display for this patient.   Carney Living PT DPT  05/22/2016, 8:39 AM  [facility] [address] [place], [place], [address] Phone: [phone]   Fax:  [phone]  Name: Priscilla Underwood MRN: [id number] Date of Birth: 11-28-1961

## 2016-05-23 DIAGNOSIS — G8929 Other chronic pain: Secondary | ICD-10-CM | POA: Diagnosis not present

## 2016-05-23 DIAGNOSIS — M5136 Other intervertebral disc degeneration, lumbar region: Secondary | ICD-10-CM | POA: Diagnosis not present

## 2016-05-23 DIAGNOSIS — M5441 Lumbago with sciatica, right side: Secondary | ICD-10-CM | POA: Diagnosis not present

## 2016-05-26 ENCOUNTER — Ambulatory Visit: Payer: 59 | Admitting: Physical Therapy

## 2016-05-26 DIAGNOSIS — M542 Cervicalgia: Secondary | ICD-10-CM | POA: Diagnosis not present

## 2016-05-26 DIAGNOSIS — M7062 Trochanteric bursitis, left hip: Secondary | ICD-10-CM | POA: Diagnosis not present

## 2016-05-26 DIAGNOSIS — Z79899 Other long term (current) drug therapy: Secondary | ICD-10-CM | POA: Diagnosis not present

## 2016-05-26 DIAGNOSIS — M5431 Sciatica, right side: Secondary | ICD-10-CM | POA: Diagnosis not present

## 2016-05-26 DIAGNOSIS — M797 Fibromyalgia: Secondary | ICD-10-CM | POA: Diagnosis not present

## 2016-05-26 DIAGNOSIS — M2550 Pain in unspecified joint: Secondary | ICD-10-CM | POA: Diagnosis not present

## 2016-05-26 MED FILL — NAPROXEN 500 MG TABLET: 500 | 30 days supply | Qty: 60 | Fill #0

## 2016-05-29 ENCOUNTER — Encounter: Payer: 59 | Admitting: Physical Therapy

## 2016-05-29 MED FILL — ZOLPIDEM TARTRATE 5 MG TAB: 5 | 30 days supply | Qty: 30 | Fill #0

## 2016-05-30 DIAGNOSIS — Z0100 Encounter for examination of eyes and vision without abnormal findings: Secondary | ICD-10-CM | POA: Diagnosis not present

## 2016-05-30 DIAGNOSIS — H5213 Myopia, bilateral: Secondary | ICD-10-CM | POA: Diagnosis not present

## 2016-06-01 MED FILL — MELOXICAM 15 MG TABLET: 15 | 30 days supply | Qty: 30 | Fill #2

## 2016-06-03 ENCOUNTER — Encounter: Payer: 59 | Admitting: Physical Therapy

## 2016-06-05 ENCOUNTER — Encounter: Payer: 59 | Admitting: Physical Therapy

## 2016-06-09 MED FILL — LYRICA 75 MG CAPSULE: 75 | 30 days supply | Qty: 30 | Fill #0

## 2016-06-17 DIAGNOSIS — M5136 Other intervertebral disc degeneration, lumbar region: Secondary | ICD-10-CM | POA: Diagnosis not present

## 2016-06-23 MED FILL — SERTRALINE HCL 100 MG TAB: 100 | 30 days supply | Qty: 30 | Fill #5

## 2016-06-23 MED FILL — PANTOPRAZOLE SOD DR 40 MG T: 40 | 30 days supply | Qty: 60 | Fill #2

## 2016-07-01 MED FILL — ZOLPIDEM TARTRATE 5 MG TAB: 5 | 30 days supply | Qty: 30 | Fill #1

## 2016-07-09 DIAGNOSIS — M5136 Other intervertebral disc degeneration, lumbar region: Secondary | ICD-10-CM | POA: Diagnosis not present

## 2016-07-09 DIAGNOSIS — M5441 Lumbago with sciatica, right side: Secondary | ICD-10-CM | POA: Diagnosis not present

## 2016-07-09 DIAGNOSIS — M533 Sacrococcygeal disorders, not elsewhere classified: Secondary | ICD-10-CM | POA: Diagnosis not present

## 2016-07-16 MED FILL — SERTRALINE HCL 100 MG TAB: 100 | 30 days supply | Qty: 30 | Fill #0

## 2016-07-17 MED FILL — LYRICA 75 MG CAPSULE: 75 | 30 days supply | Qty: 30 | Fill #1

## 2016-07-30 MED FILL — ZOLPIDEM TARTRATE 5 MG TAB: 5 | 30 days supply | Qty: 30 | Fill #0

## 2016-08-07 DIAGNOSIS — M5441 Lumbago with sciatica, right side: Secondary | ICD-10-CM | POA: Diagnosis not present

## 2016-08-07 DIAGNOSIS — M5136 Other intervertebral disc degeneration, lumbar region: Secondary | ICD-10-CM | POA: Diagnosis not present

## 2016-08-07 MED FILL — NAPROXEN 500 MG TABLET: 500 | 30 days supply | Qty: 60 | Fill #1

## 2016-08-07 MED FILL — PANTOPRAZOLE SOD DR 40 MG T: 40 | 30 days supply | Qty: 60 | Fill #3

## 2016-08-16 DIAGNOSIS — M9904 Segmental and somatic dysfunction of sacral region: Secondary | ICD-10-CM | POA: Diagnosis not present

## 2016-08-16 DIAGNOSIS — M5137 Other intervertebral disc degeneration, lumbosacral region: Secondary | ICD-10-CM | POA: Diagnosis not present

## 2016-08-16 DIAGNOSIS — M9902 Segmental and somatic dysfunction of thoracic region: Secondary | ICD-10-CM | POA: Diagnosis not present

## 2016-08-16 DIAGNOSIS — M5136 Other intervertebral disc degeneration, lumbar region: Secondary | ICD-10-CM | POA: Diagnosis not present

## 2016-08-16 DIAGNOSIS — M791 Myalgia: Secondary | ICD-10-CM | POA: Diagnosis not present

## 2016-08-16 DIAGNOSIS — M9903 Segmental and somatic dysfunction of lumbar region: Secondary | ICD-10-CM | POA: Diagnosis not present

## 2016-08-18 DIAGNOSIS — M5137 Other intervertebral disc degeneration, lumbosacral region: Secondary | ICD-10-CM | POA: Diagnosis not present

## 2016-08-18 DIAGNOSIS — M9904 Segmental and somatic dysfunction of sacral region: Secondary | ICD-10-CM | POA: Diagnosis not present

## 2016-08-18 DIAGNOSIS — M9903 Segmental and somatic dysfunction of lumbar region: Secondary | ICD-10-CM | POA: Diagnosis not present

## 2016-08-18 DIAGNOSIS — M9902 Segmental and somatic dysfunction of thoracic region: Secondary | ICD-10-CM | POA: Diagnosis not present

## 2016-08-18 DIAGNOSIS — M5136 Other intervertebral disc degeneration, lumbar region: Secondary | ICD-10-CM | POA: Diagnosis not present

## 2016-08-18 DIAGNOSIS — M791 Myalgia: Secondary | ICD-10-CM | POA: Diagnosis not present

## 2016-08-20 DIAGNOSIS — M9903 Segmental and somatic dysfunction of lumbar region: Secondary | ICD-10-CM | POA: Diagnosis not present

## 2016-08-20 DIAGNOSIS — M5136 Other intervertebral disc degeneration, lumbar region: Secondary | ICD-10-CM | POA: Diagnosis not present

## 2016-08-20 DIAGNOSIS — M9904 Segmental and somatic dysfunction of sacral region: Secondary | ICD-10-CM | POA: Diagnosis not present

## 2016-08-20 DIAGNOSIS — M5137 Other intervertebral disc degeneration, lumbosacral region: Secondary | ICD-10-CM | POA: Diagnosis not present

## 2016-08-20 DIAGNOSIS — M9902 Segmental and somatic dysfunction of thoracic region: Secondary | ICD-10-CM | POA: Diagnosis not present

## 2016-08-20 DIAGNOSIS — M791 Myalgia: Secondary | ICD-10-CM | POA: Diagnosis not present

## 2016-08-22 DIAGNOSIS — M5137 Other intervertebral disc degeneration, lumbosacral region: Secondary | ICD-10-CM | POA: Diagnosis not present

## 2016-08-22 DIAGNOSIS — M791 Myalgia: Secondary | ICD-10-CM | POA: Diagnosis not present

## 2016-08-22 DIAGNOSIS — M9903 Segmental and somatic dysfunction of lumbar region: Secondary | ICD-10-CM | POA: Diagnosis not present

## 2016-08-22 DIAGNOSIS — M9902 Segmental and somatic dysfunction of thoracic region: Secondary | ICD-10-CM | POA: Diagnosis not present

## 2016-08-22 DIAGNOSIS — M9904 Segmental and somatic dysfunction of sacral region: Secondary | ICD-10-CM | POA: Diagnosis not present

## 2016-08-22 DIAGNOSIS — M5136 Other intervertebral disc degeneration, lumbar region: Secondary | ICD-10-CM | POA: Diagnosis not present

## 2016-08-22 MED FILL — SERTRALINE HCL 100 MG TAB: 100 | 30 days supply | Qty: 30 | Fill #0

## 2016-08-25 DIAGNOSIS — M5137 Other intervertebral disc degeneration, lumbosacral region: Secondary | ICD-10-CM | POA: Diagnosis not present

## 2016-08-25 DIAGNOSIS — M5136 Other intervertebral disc degeneration, lumbar region: Secondary | ICD-10-CM | POA: Diagnosis not present

## 2016-08-25 DIAGNOSIS — M9903 Segmental and somatic dysfunction of lumbar region: Secondary | ICD-10-CM | POA: Diagnosis not present

## 2016-08-25 DIAGNOSIS — M791 Myalgia: Secondary | ICD-10-CM | POA: Diagnosis not present

## 2016-08-25 DIAGNOSIS — M9902 Segmental and somatic dysfunction of thoracic region: Secondary | ICD-10-CM | POA: Diagnosis not present

## 2016-08-25 DIAGNOSIS — M9904 Segmental and somatic dysfunction of sacral region: Secondary | ICD-10-CM | POA: Diagnosis not present

## 2016-08-27 DIAGNOSIS — M9903 Segmental and somatic dysfunction of lumbar region: Secondary | ICD-10-CM | POA: Diagnosis not present

## 2016-08-27 DIAGNOSIS — M9902 Segmental and somatic dysfunction of thoracic region: Secondary | ICD-10-CM | POA: Diagnosis not present

## 2016-08-27 DIAGNOSIS — M791 Myalgia: Secondary | ICD-10-CM | POA: Diagnosis not present

## 2016-08-27 DIAGNOSIS — M5136 Other intervertebral disc degeneration, lumbar region: Secondary | ICD-10-CM | POA: Diagnosis not present

## 2016-08-27 DIAGNOSIS — M5137 Other intervertebral disc degeneration, lumbosacral region: Secondary | ICD-10-CM | POA: Diagnosis not present

## 2016-08-27 DIAGNOSIS — M9904 Segmental and somatic dysfunction of sacral region: Secondary | ICD-10-CM | POA: Diagnosis not present

## 2016-08-29 DIAGNOSIS — K219 Gastro-esophageal reflux disease without esophagitis: Secondary | ICD-10-CM | POA: Diagnosis not present

## 2016-08-29 DIAGNOSIS — E669 Obesity, unspecified: Secondary | ICD-10-CM | POA: Diagnosis not present

## 2016-08-29 DIAGNOSIS — G4700 Insomnia, unspecified: Secondary | ICD-10-CM | POA: Diagnosis not present

## 2016-08-29 DIAGNOSIS — F329 Major depressive disorder, single episode, unspecified: Secondary | ICD-10-CM | POA: Diagnosis not present

## 2016-08-29 MED FILL — ZOLPIDEM TARTRATE 5 MG TAB: 5 | 30 days supply | Qty: 30 | Fill #0

## 2016-09-11 MED FILL — LYRICA 75 MG CAPSULE: 75 | 30 days supply | Qty: 30 | Fill #2

## 2016-09-15 DIAGNOSIS — M545 Low back pain: Secondary | ICD-10-CM | POA: Diagnosis not present

## 2016-09-18 ENCOUNTER — Other Ambulatory Visit: Payer: Self-pay | Admitting: Neurological Surgery

## 2016-09-18 DIAGNOSIS — M545 Low back pain: Secondary | ICD-10-CM

## 2016-09-27 ENCOUNTER — Ambulatory Visit
Admission: RE | Admit: 2016-09-27 | Discharge: 2016-09-27 | Disposition: A | Discharge status: Home / Self Care | Payer: 59 | Source: Ambulatory Visit | Attending: Neurological Surgery | Admitting: Neurological Surgery

## 2016-09-27 DIAGNOSIS — M48061 Spinal stenosis, lumbar region without neurogenic claudication: Secondary | ICD-10-CM | POA: Diagnosis not present

## 2016-09-27 DIAGNOSIS — M545 Low back pain: Secondary | ICD-10-CM

## 2016-09-30 MED FILL — SERTRALINE HCL 100 MG TAB: 100 | 30 days supply | Qty: 30 | Fill #0

## 2016-09-30 MED FILL — ZOLPIDEM TARTRATE 5 MG TAB: 5 | 30 days supply | Qty: 30 | Fill #1

## 2016-10-01 DIAGNOSIS — Z79899 Other long term (current) drug therapy: Secondary | ICD-10-CM | POA: Diagnosis not present

## 2016-10-01 DIAGNOSIS — M5431 Sciatica, right side: Secondary | ICD-10-CM | POA: Diagnosis not present

## 2016-10-01 DIAGNOSIS — M7989 Other specified soft tissue disorders: Secondary | ICD-10-CM | POA: Diagnosis not present

## 2016-10-01 DIAGNOSIS — M797 Fibromyalgia: Secondary | ICD-10-CM | POA: Diagnosis not present

## 2016-10-01 DIAGNOSIS — M2550 Pain in unspecified joint: Secondary | ICD-10-CM | POA: Diagnosis not present

## 2016-10-01 MED FILL — predniSONE 10 MG TABS: 10 | 7 days supply | Qty: 21 | Fill #0

## 2016-10-06 DIAGNOSIS — M545 Low back pain: Secondary | ICD-10-CM | POA: Diagnosis not present

## 2016-10-10 DIAGNOSIS — Z124 Encounter for screening for malignant neoplasm of cervix: Secondary | ICD-10-CM | POA: Diagnosis not present

## 2016-10-10 DIAGNOSIS — Z01419 Encounter for gynecological examination (general) (routine) without abnormal findings: Secondary | ICD-10-CM | POA: Diagnosis not present

## 2016-10-10 DIAGNOSIS — Z6832 Body mass index (BMI) 32.0-32.9, adult: Secondary | ICD-10-CM | POA: Diagnosis not present

## 2016-10-12 MED FILL — PANTOPRAZOLE SOD DR 40 MG T: 40 | 30 days supply | Qty: 60 | Fill #4

## 2016-10-12 MED FILL — NAPROXEN 500 MG TABLET: 500 | 30 days supply | Qty: 60 | Fill #2

## 2016-10-13 MED FILL — LYRICA 75 MG CAPSULE: 75 | 30 days supply | Qty: 30 | Fill #0

## 2016-10-14 DIAGNOSIS — R2689 Other abnormalities of gait and mobility: Secondary | ICD-10-CM | POA: Diagnosis not present

## 2016-10-14 DIAGNOSIS — M545 Low back pain: Secondary | ICD-10-CM | POA: Diagnosis not present

## 2016-10-14 DIAGNOSIS — M6281 Muscle weakness (generalized): Secondary | ICD-10-CM | POA: Diagnosis not present

## 2016-10-21 DIAGNOSIS — Z6832 Body mass index (BMI) 32.0-32.9, adult: Secondary | ICD-10-CM | POA: Diagnosis not present

## 2016-10-21 DIAGNOSIS — M461 Sacroiliitis, not elsewhere classified: Secondary | ICD-10-CM | POA: Diagnosis not present

## 2016-10-27 MED FILL — SERTRALINE HCL 100 MG TAB: 100 | 30 days supply | Qty: 30 | Fill #1

## 2016-10-29 DIAGNOSIS — M545 Low back pain: Secondary | ICD-10-CM | POA: Diagnosis not present

## 2016-10-29 DIAGNOSIS — M6281 Muscle weakness (generalized): Secondary | ICD-10-CM | POA: Diagnosis not present

## 2016-10-29 DIAGNOSIS — R2689 Other abnormalities of gait and mobility: Secondary | ICD-10-CM | POA: Diagnosis not present

## 2016-11-03 DIAGNOSIS — M545 Low back pain: Secondary | ICD-10-CM | POA: Diagnosis not present

## 2016-11-03 DIAGNOSIS — R2689 Other abnormalities of gait and mobility: Secondary | ICD-10-CM | POA: Diagnosis not present

## 2016-11-03 DIAGNOSIS — M6281 Muscle weakness (generalized): Secondary | ICD-10-CM | POA: Diagnosis not present

## 2016-11-03 MED FILL — ZOLPIDEM TARTRATE 5 MG TAB: 5 | 30 days supply | Qty: 30 | Fill #1

## 2016-11-06 DIAGNOSIS — R2689 Other abnormalities of gait and mobility: Secondary | ICD-10-CM | POA: Diagnosis not present

## 2016-11-06 DIAGNOSIS — M545 Low back pain: Secondary | ICD-10-CM | POA: Diagnosis not present

## 2016-11-06 DIAGNOSIS — M6281 Muscle weakness (generalized): Secondary | ICD-10-CM | POA: Diagnosis not present

## 2016-11-10 DIAGNOSIS — M545 Low back pain: Secondary | ICD-10-CM | POA: Diagnosis not present

## 2016-11-10 DIAGNOSIS — M6281 Muscle weakness (generalized): Secondary | ICD-10-CM | POA: Diagnosis not present

## 2016-11-10 DIAGNOSIS — R2689 Other abnormalities of gait and mobility: Secondary | ICD-10-CM | POA: Diagnosis not present

## 2016-11-11 MED FILL — LYRICA 75 MG CAPSULE: 75 | 30 days supply | Qty: 30 | Fill #1

## 2016-11-13 DIAGNOSIS — R2689 Other abnormalities of gait and mobility: Secondary | ICD-10-CM | POA: Diagnosis not present

## 2016-11-13 DIAGNOSIS — M545 Low back pain: Secondary | ICD-10-CM | POA: Diagnosis not present

## 2016-11-13 DIAGNOSIS — M6281 Muscle weakness (generalized): Secondary | ICD-10-CM | POA: Diagnosis not present

## 2016-11-19 MED FILL — valACYclovir HCL 1 GM TABS: 1 | 30 days supply | Qty: 10 | Fill #1

## 2016-11-27 DIAGNOSIS — M6281 Muscle weakness (generalized): Secondary | ICD-10-CM | POA: Diagnosis not present

## 2016-11-27 DIAGNOSIS — R2689 Other abnormalities of gait and mobility: Secondary | ICD-10-CM | POA: Diagnosis not present

## 2016-11-27 DIAGNOSIS — M545 Low back pain: Secondary | ICD-10-CM | POA: Diagnosis not present

## 2016-12-04 MED FILL — ZOLPIDEM TARTRATE 5 MG TAB: 5 | 30 days supply | Qty: 30 | Fill #0

## 2016-12-04 MED FILL — SERTRALINE HCL 100 MG TAB: 100 | 30 days supply | Qty: 30 | Fill #2

## 2016-12-15 MED FILL — PANTOPRAZOLE SOD DR 40 MG T: 40 | 30 days supply | Qty: 60 | Fill #5

## 2016-12-15 MED FILL — NAPROXEN 500 MG TABLET: 500 | 30 days supply | Qty: 60 | Fill #3

## 2016-12-15 MED FILL — LYRICA 75 MG CAPSULE: 75 | 30 days supply | Qty: 30 | Fill #2

## 2016-12-18 ENCOUNTER — Other Ambulatory Visit: Payer: Self-pay | Admitting: Family Medicine

## 2016-12-18 DIAGNOSIS — Z1231 Encounter for screening mammogram for malignant neoplasm of breast: Secondary | ICD-10-CM

## 2016-12-22 DIAGNOSIS — M545 Low back pain: Secondary | ICD-10-CM | POA: Diagnosis not present

## 2017-01-01 DIAGNOSIS — M545 Low back pain: Secondary | ICD-10-CM | POA: Diagnosis not present

## 2017-01-01 DIAGNOSIS — M160 Bilateral primary osteoarthritis of hip: Secondary | ICD-10-CM | POA: Diagnosis not present

## 2017-01-01 MED FILL — ZOLPIDEM TARTRATE 5 MG TAB: 5 | 30 days supply | Qty: 30 | Fill #1

## 2017-01-01 MED FILL — SERTRALINE HCL 100 MG TAB: 100 | 30 days supply | Qty: 30 | Fill #3

## 2017-01-05 DIAGNOSIS — M545 Low back pain: Secondary | ICD-10-CM | POA: Diagnosis not present

## 2017-01-16 MED FILL — LYRICA 75 MG CAPSULE: 75 | 30 days supply | Qty: 30 | Fill #3

## 2017-01-19 ENCOUNTER — Ambulatory Visit
Admission: RE | Admit: 2017-01-19 | Discharge: 2017-01-19 | Disposition: A | Discharge status: Home / Self Care | Payer: 59 | Source: Ambulatory Visit | Attending: Family Medicine | Admitting: Family Medicine

## 2017-01-19 DIAGNOSIS — Z1231 Encounter for screening mammogram for malignant neoplasm of breast: Secondary | ICD-10-CM

## 2017-01-20 MED FILL — valACYclovir HCL 1 GM TABS: 1 | 30 days supply | Qty: 10 | Fill #2

## 2017-01-28 ENCOUNTER — Ambulatory Visit (INDEPENDENT_AMBULATORY_CARE_PROVIDER_SITE_OTHER): Payer: 59 | Admitting: Orthopaedic Surgery

## 2017-01-28 ENCOUNTER — Encounter (INDEPENDENT_AMBULATORY_CARE_PROVIDER_SITE_OTHER): Payer: Self-pay | Admitting: Orthopaedic Surgery

## 2017-01-28 VITALS — Ht 65.0 in | Wt 192.0 lb

## 2017-01-28 DIAGNOSIS — M25551 Pain in right hip: Secondary | ICD-10-CM | POA: Diagnosis not present

## 2017-01-28 MED ORDER — LIDOCAINE HCL 1 % IJ SOLN
1.0000 mL | INTRAMUSCULAR | Status: AC | PRN
  Administered 2017-01-28: 1 mL

## 2017-01-28 MED ORDER — METHYLPREDNISOLONE ACETATE 40 MG/ML IJ SUSP
40.0000 mg | INTRAMUSCULAR | Status: AC | PRN
  Administered 2017-01-28: 40 mg via INTRA_ARTICULAR

## 2017-01-28 NOTE — Progress Notes (Signed)
Office Visit Note   Patient: Priscilla Underwood           Date of Birth: 07/29/1961           MRN: [id number] Visit Date: 01/28/2017              Requested by: Orpah Melter, MD [address] [place], [place] [address] PCP: Orpah Melter, MD   Assessment & Plan: Visit Diagnoses: No diagnosis found.  Plan: She tolerated the 2 injections well. One was a trigger point injection in her gluteal region on the right side and the other was around the tip of the greater trochanter. We long thorough discussion about trying stretching exercises and I reviewed her MRI with her show the minimal tendinitis that she has. Certainly she understands this is frustrating process dealing with pain date is difficult to treat with rest, ice, heat, anti-inflammatories and physical therapy as well as injections. I like see her back in 6 weeks to see how she tolerates to injections. She'll try not to sleep on that side as well.  Follow-Up Instructions: Return in about 6 weeks (around 03/11/2017).   Orders:  No orders of the defined types were placed in this encounter.  No orders of the defined types were placed in this encounter.     Procedures: Large Joint Inj Date/Time: 01/28/2017 10:20 AM Performed by: Mcarthur Rossetti Authorized by: Mcarthur Rossetti   Location:  Hip Site:  R greater trochanter Ultrasound Guidance: No   Fluoroscopic Guidance: No   Arthrogram: No   Medications:  1 mL lidocaine 1 %; 40 mg methylPREDNISolone acetate 40 MG/ML     Clinical Data: No additional findings.   Subjective: Chief Complaint  Patient presents with  . Lower Back - Pain    Right posterior buttock pain with radicular pain down right leg. Patient was referred by Dr. Sherley Bounds, was advised she had tendinitis in gluteus muscle.     HPI Patient is very pleasant 56 year old who works in the [employer]. She's had a problem with chronic right hip pain which she points the gluteal region  around her issue him as well as the trochanteric region. She has had remote injections in the trochanteric region the past. She's been to physical therapy as well. She actually has MRI of her pelvis and opposite her for my review. Her pain is not debilitating but it is frustrating. It hurts her when she sleeps at night hurts with sitting and squatting there activities. She denies any radicular components or weakness. She denies any groin pain. Review of Systems She also denies any headache, chest pain, short of breath, fever, chills, nausea, vomiting.  Objective: Vital Signs: Ht 5\' 5"  (1.651 m)   Wt 192 lb (87.1 kg)   BMI 31.95 kg/m   Physical Exam She is a very pleasant individual who is orally 3 in no acute distress she walks without a limp and gets on the exam table very easily. Ortho Exam Examination of her right hip shows fluid range of motion of that hip. She has a little bit of pain over the trochanteric area as well as the gluteal area but excellent strength in all the hip muscles. She has excellent strength distally in a normal motor and sensory exam in her bilateral lower extremities well. Specialty Comments:  No specialty comments available.  Imaging: No results found. I independently reviewed the MRI of her pelvis as well as went over this with her. I  do not really appreciate the mild inflammatory changes are seen in the gluteus medius or minimus tendon area of the insertion of the trochanteric area. I see no pathology around the initial area or the SI joint.  PMFS History: There are no active problems to display for this patient.  Past Medical History:  Diagnosis Date  . Arthritis    osteo arthritis  . Depression   . Fibromyalgia   . GERD (gastroesophageal reflux disease)   . PONV (postoperative nausea and vomiting)     Family History  Problem Relation Age of Onset  . Breast cancer Sister     Past Surgical History:  Procedure Laterality Date  . BUNIONECTOMY  2004    . CARPAL TUNNEL RELEASE Left 06/15/2015   Procedure: LEFT CARPAL TUNNEL RELEASE;  Surgeon: Roseanne Kaufman, MD;  Location: [facility];  Service: Orthopedics;  Laterality: Left;  . CARPAL TUNNEL RELEASE Right 08/31/2015   Procedure: RIGHT LIMITED OPEN CARPAL TUNNEL RELEASE;  Surgeon: Roseanne Kaufman, MD;  Location: [facility];  Service: Orthopedics;  Laterality: Right;  . SHOULDER SURGERY  2010, 2008  . TARSAL TUNNEL RELEASE Left 2012  . TUBAL LIGATION  1985   Social History   Occupational History  . Not on file.   Social History Main Topics  . Smoking status: Former Smoker    Types: Cigarettes  . Smokeless tobacco: Never Used     Comment: Quit 35 years ago  . Alcohol use No  . Drug use: No  . Sexual activity: Yes    Birth control/ protection: Post-menopausal

## 2017-02-02 MED FILL — ZOLPIDEM TARTRATE 5 MG TAB: 5 | 30 days supply | Qty: 30 | Fill #0

## 2017-02-07 MED FILL — SERTRALINE HCL 100 MG TAB: 100 | 30 days supply | Qty: 30 | Fill #4

## 2017-02-09 MED FILL — DICLOFENAC SOD 75 MG TAB EC: 75 | 30 days supply | Qty: 60 | Fill #2

## 2017-02-13 DIAGNOSIS — R21 Rash and other nonspecific skin eruption: Secondary | ICD-10-CM | POA: Diagnosis not present

## 2017-02-13 DIAGNOSIS — M5431 Sciatica, right side: Secondary | ICD-10-CM | POA: Diagnosis not present

## 2017-02-13 DIAGNOSIS — E669 Obesity, unspecified: Secondary | ICD-10-CM | POA: Diagnosis not present

## 2017-02-13 DIAGNOSIS — M2550 Pain in unspecified joint: Secondary | ICD-10-CM | POA: Diagnosis not present

## 2017-02-13 DIAGNOSIS — M7062 Trochanteric bursitis, left hip: Secondary | ICD-10-CM | POA: Diagnosis not present

## 2017-02-13 DIAGNOSIS — Z79899 Other long term (current) drug therapy: Secondary | ICD-10-CM | POA: Diagnosis not present

## 2017-02-13 DIAGNOSIS — M797 Fibromyalgia: Secondary | ICD-10-CM | POA: Diagnosis not present

## 2017-02-13 DIAGNOSIS — Z6833 Body mass index (BMI) 33.0-33.9, adult: Secondary | ICD-10-CM | POA: Diagnosis not present

## 2017-02-23 MED FILL — LYRICA 75 MG CAPSULE: 75 | 30 days supply | Qty: 30 | Fill #0

## 2017-02-23 MED FILL — PANTOPRAZOLE SOD DR 40 MG T: 40 | 30 days supply | Qty: 60 | Fill #0

## 2017-02-25 DIAGNOSIS — R1013 Epigastric pain: Secondary | ICD-10-CM | POA: Diagnosis not present

## 2017-02-27 DIAGNOSIS — E669 Obesity, unspecified: Secondary | ICD-10-CM | POA: Diagnosis not present

## 2017-02-27 DIAGNOSIS — F329 Major depressive disorder, single episode, unspecified: Secondary | ICD-10-CM | POA: Diagnosis not present

## 2017-02-27 DIAGNOSIS — Z6833 Body mass index (BMI) 33.0-33.9, adult: Secondary | ICD-10-CM | POA: Diagnosis not present

## 2017-02-27 DIAGNOSIS — K219 Gastro-esophageal reflux disease without esophagitis: Secondary | ICD-10-CM | POA: Diagnosis not present

## 2017-02-27 DIAGNOSIS — G4700 Insomnia, unspecified: Secondary | ICD-10-CM | POA: Diagnosis not present

## 2017-03-03 DIAGNOSIS — K449 Diaphragmatic hernia without obstruction or gangrene: Secondary | ICD-10-CM | POA: Diagnosis not present

## 2017-03-03 DIAGNOSIS — K2950 Unspecified chronic gastritis without bleeding: Secondary | ICD-10-CM | POA: Diagnosis not present

## 2017-03-03 DIAGNOSIS — K3189 Other diseases of stomach and duodenum: Secondary | ICD-10-CM | POA: Diagnosis not present

## 2017-03-03 DIAGNOSIS — R1013 Epigastric pain: Secondary | ICD-10-CM | POA: Diagnosis not present

## 2017-03-06 DIAGNOSIS — R1013 Epigastric pain: Secondary | ICD-10-CM | POA: Diagnosis not present

## 2017-03-09 MED FILL — ZOLPIDEM TARTRATE 5 MG TAB: 5 | 30 days supply | Qty: 30 | Fill #0

## 2017-03-10 DIAGNOSIS — M5416 Radiculopathy, lumbar region: Secondary | ICD-10-CM | POA: Diagnosis not present

## 2017-03-15 MED FILL — SERTRALINE HCL 100 MG TAB: 100 | 30 days supply | Qty: 30 | Fill #5

## 2017-03-30 ENCOUNTER — Ambulatory Visit (INDEPENDENT_AMBULATORY_CARE_PROVIDER_SITE_OTHER): Payer: 59 | Admitting: Orthopaedic Surgery

## 2017-04-12 MED FILL — PANTOPRAZOLE SOD DR 40 MG T: 40 | 30 days supply | Qty: 60 | Fill #1

## 2017-04-13 MED FILL — DICLOFENAC SOD 75 MG TAB EC: 75 | 30 days supply | Qty: 60 | Fill #0

## 2017-04-13 MED FILL — SERTRALINE HCL 100 MG TAB: 100 | 30 days supply | Qty: 30 | Fill #0

## 2017-04-16 DIAGNOSIS — F329 Major depressive disorder, single episode, unspecified: Secondary | ICD-10-CM | POA: Diagnosis not present

## 2017-04-17 MED FILL — ZOLPIDEM TARTRATE 5 MG TAB: 5 | 30 days supply | Qty: 30 | Fill #1

## 2017-05-18 MED FILL — ZOLPIDEM TARTRATE 5 MG TAB: 5 | 30 days supply | Qty: 30 | Fill #2

## 2017-05-18 MED FILL — SERTRALINE HCL 100 MG TAB: 100 | 30 days supply | Qty: 30 | Fill #1

## 2017-05-20 DIAGNOSIS — Z6833 Body mass index (BMI) 33.0-33.9, adult: Secondary | ICD-10-CM | POA: Diagnosis not present

## 2017-05-20 DIAGNOSIS — E663 Overweight: Secondary | ICD-10-CM | POA: Diagnosis not present

## 2017-05-20 DIAGNOSIS — Z79899 Other long term (current) drug therapy: Secondary | ICD-10-CM | POA: Diagnosis not present

## 2017-05-20 DIAGNOSIS — M5431 Sciatica, right side: Secondary | ICD-10-CM | POA: Diagnosis not present

## 2017-05-20 DIAGNOSIS — M2550 Pain in unspecified joint: Secondary | ICD-10-CM | POA: Diagnosis not present

## 2017-05-20 DIAGNOSIS — M797 Fibromyalgia: Secondary | ICD-10-CM | POA: Diagnosis not present

## 2017-06-03 MED FILL — MELOXICAM 15 MG TABLET: 15 | 30 days supply | Qty: 30 | Fill #0

## 2017-06-09 MED FILL — PANTOPRAZOLE SOD DR 40 MG T: 40 | 30 days supply | Qty: 60 | Fill #2

## 2017-06-13 MED FILL — LYRICA 75 MG CAPSULE: 75 | 30 days supply | Qty: 30 | Fill #1

## 2017-06-17 DIAGNOSIS — M545 Low back pain: Secondary | ICD-10-CM | POA: Diagnosis not present

## 2017-06-17 DIAGNOSIS — M5416 Radiculopathy, lumbar region: Secondary | ICD-10-CM | POA: Diagnosis not present

## 2017-06-17 DIAGNOSIS — Z6834 Body mass index (BMI) 34.0-34.9, adult: Secondary | ICD-10-CM | POA: Diagnosis not present

## 2017-06-18 MED FILL — ZOLPIDEM TARTRATE 5 MG TAB: 5 | 30 days supply | Qty: 30 | Fill #3

## 2017-06-19 ENCOUNTER — Ambulatory Visit (INDEPENDENT_AMBULATORY_CARE_PROVIDER_SITE_OTHER): Payer: 59

## 2017-06-19 ENCOUNTER — Ambulatory Visit (INDEPENDENT_AMBULATORY_CARE_PROVIDER_SITE_OTHER): Payer: 59 | Admitting: Podiatry

## 2017-06-19 ENCOUNTER — Encounter: Payer: Self-pay | Admitting: Podiatry

## 2017-06-19 VITALS — BP 121/69 | HR 52 | Resp 16

## 2017-06-19 DIAGNOSIS — M779 Enthesopathy, unspecified: Secondary | ICD-10-CM | POA: Diagnosis not present

## 2017-06-19 DIAGNOSIS — M7750 Other enthesopathy of unspecified foot: Secondary | ICD-10-CM

## 2017-06-19 DIAGNOSIS — M79672 Pain in left foot: Secondary | ICD-10-CM | POA: Diagnosis not present

## 2017-06-19 MED ORDER — TRIAMCINOLONE ACETONIDE 10 MG/ML IJ SUSP
10.0000 mg | Freq: Once | INTRAMUSCULAR | Status: AC
  Administered 2017-06-19: 10 mg

## 2017-06-19 NOTE — Progress Notes (Signed)
Subjective:    Patient ID: Priscilla Underwood, female   DOB: 56 y.o.   MRN: [id number]   HPI patient points the outside left foot stating it's hurting a lot and making it difficult to walk. Does not remember specific injury and states it's been present for at least a month    ROS      Objective:  Physical Exam neurovascular status intact with patient noted to have inflammation pain to the base of the fifth metatarsal left with fluid buildup and no indication of muscle weakness or dysfunction.     Assessment:    Inflammatory tendinitis base of fifth metatarsal left     Plan:  H&P x-ray performed and careful sheath injection administered 3 mg Kenalog 5 mill grams Xylocaine advised on ice therapy and dispensed fascial brace to lift up the lateral side of the foot  X-ray indicated that there is no signs of dysfunction or bone spurs base of fifth metatarsal left

## 2017-06-22 MED FILL — SERTRALINE HCL 100 MG TAB: 100 | 30 days supply | Qty: 30 | Fill #2

## 2017-07-17 MED FILL — SERTRALINE HCL 100 MG TAB: 100 | 30 days supply | Qty: 30 | Fill #3

## 2017-07-17 MED FILL — NAPROXEN 500 MG TABLET: 500 | 30 days supply | Qty: 60 | Fill #0

## 2017-07-17 MED FILL — LYRICA 75 MG CAPSULE: 75 | 30 days supply | Qty: 30 | Fill #2

## 2017-07-23 MED FILL — ZOLPIDEM TARTRATE 5 MG TAB: 5 | 30 days supply | Qty: 30 | Fill #4

## 2017-08-04 DIAGNOSIS — D3131 Benign neoplasm of right choroid: Secondary | ICD-10-CM | POA: Diagnosis not present

## 2017-08-23 MED FILL — SERTRALINE HCL 100 MG TAB: 100 | 30 days supply | Qty: 30 | Fill #4

## 2017-08-23 MED FILL — PANTOPRAZOLE SOD DR 40 MG T: 40 | 30 days supply | Qty: 60 | Fill #3

## 2017-08-24 MED FILL — DICLOFENAC SODIUM 75 MG TAB: 75 | 30 days supply | Qty: 60 | Fill #1

## 2017-08-24 MED FILL — LYRICA 75 MG CAPSULE: 75 | 30 days supply | Qty: 30 | Fill #0

## 2017-09-22 MED FILL — DICLOFENAC SODIUM 75 MG TAB: 75 | 30 days supply | Qty: 60 | Fill #2

## 2017-09-22 MED FILL — SERTRALINE HCL 100 MG TAB: 100 | 30 days supply | Qty: 30 | Fill #5

## 2017-09-22 MED FILL — LYRICA 75 MG CAPSULE: 75 | 30 days supply | Qty: 30 | Fill #1

## 2017-09-23 MED FILL — ZOLPIDEM TARTRATE 5 MG TABL: 5 | 30 days supply | Qty: 30 | Fill #0

## 2017-09-28 DIAGNOSIS — M5431 Sciatica, right side: Secondary | ICD-10-CM | POA: Diagnosis not present

## 2017-09-28 DIAGNOSIS — M797 Fibromyalgia: Secondary | ICD-10-CM | POA: Diagnosis not present

## 2017-09-28 DIAGNOSIS — Z79899 Other long term (current) drug therapy: Secondary | ICD-10-CM | POA: Diagnosis not present

## 2017-09-28 DIAGNOSIS — M2550 Pain in unspecified joint: Secondary | ICD-10-CM | POA: Diagnosis not present

## 2017-09-28 DIAGNOSIS — E669 Obesity, unspecified: Secondary | ICD-10-CM | POA: Diagnosis not present

## 2017-09-28 DIAGNOSIS — Z6833 Body mass index (BMI) 33.0-33.9, adult: Secondary | ICD-10-CM | POA: Diagnosis not present

## 2017-09-28 MED FILL — DICLOFENAC SODIUM 1% GEL: 1 | 30 days supply | Qty: 100 | Fill #0

## 2017-10-14 MED FILL — valACYclovir HCL 1 GM TABS: 1 | 30 days supply | Qty: 10 | Fill #0

## 2017-10-15 DIAGNOSIS — Z124 Encounter for screening for malignant neoplasm of cervix: Secondary | ICD-10-CM | POA: Diagnosis not present

## 2017-10-15 DIAGNOSIS — Z01419 Encounter for gynecological examination (general) (routine) without abnormal findings: Secondary | ICD-10-CM | POA: Diagnosis not present

## 2017-10-21 ENCOUNTER — Ambulatory Visit: Payer: 59 | Admitting: Podiatry

## 2017-10-21 ENCOUNTER — Encounter: Payer: Self-pay | Admitting: Podiatry

## 2017-10-21 DIAGNOSIS — M779 Enthesopathy, unspecified: Secondary | ICD-10-CM | POA: Diagnosis not present

## 2017-10-21 DIAGNOSIS — T148XXA Other injury of unspecified body region, initial encounter: Secondary | ICD-10-CM

## 2017-10-21 MED ORDER — PREDNISONE 10 MG PO TABS: ORAL_TABLET | ORAL | 0 refills | Status: DC

## 2017-10-21 MED FILL — predniSONE 10 MG (48) TBPK: 10 | 12 days supply | Qty: 48 | Fill #0

## 2017-10-21 NOTE — Progress Notes (Signed)
Subjective:    Patient ID: Priscilla Underwood, female   DOB: 56 y.o.   MRN: [id number]   HPI patient states that she's having a lot of pain and swelling on the outside of the left foot and it's been hard to walk with and also the arch is hurting top of the foot and she does see a rheumatologist and does not apparently have any indications of systemic arthritis    ROS      Objective:  Physical Exam neurovascular status intact with inflammation swelling around the fifth metatarsal base and proximal to this within the peroneal tendon group     Assessment:  Possibility for peroneal tendinitis left or possible longitudinal tear       Plan:    H&P reviewed condition and placed on sterile prep DS 12 day Dosepak. I'm also going to go ahead and order an MRI to rule out a tear of the posterior peroneal tendon group and we will see her back when we get the results of this procedure. Due to the discomfort I did dispense air fracture walker with all instructions on usage

## 2017-10-22 ENCOUNTER — Telehealth: Payer: Self-pay | Admitting: Podiatry

## 2017-10-22 NOTE — Telephone Encounter (Signed)
I was in yesterday and saw Dr. Paulla Dolly. He put me in a boot after examining me. I think the boot is too big. She tried a couple of different sizes on me yesterday. This one actually flopped around and hit my shin causing it to bruise and it just feels super big. I was wondering if I could come in and exchange it out and get resized. I could not come until after lunch tomorrow. I'm not wearing it because its too big. If you would, please give me a call back at [phone] which is my desk work phone and you can leave a message. Thank you very much. Bye bye.

## 2017-10-22 NOTE — Telephone Encounter (Signed)
I spoke with pt, she states the boot flops around and has bruised her leg, and she can come in tomorrow after 2:15pm. I told her just ask for me.

## 2017-10-23 MED FILL — PANTOPRAZOLE SOD DR 40 MG T: 40 | 30 days supply | Qty: 60 | Fill #0

## 2017-10-23 MED FILL — ZOLPIDEM TARTRATE 5 MG TABL: 5 | 30 days supply | Qty: 30 | Fill #1

## 2017-10-23 MED FILL — LYRICA 75 MG CAPSULE: 75 | 30 days supply | Qty: 30 | Fill #2

## 2017-10-23 MED FILL — DICLOFENAC SODIUM 75 MG TAB: 75 | 30 days supply | Qty: 60 | Fill #3

## 2017-10-23 MED FILL — SERTRALINE HCL 100 MG TAB: 100 | 30 days supply | Qty: 30 | Fill #0

## 2017-10-23 NOTE — Telephone Encounter (Signed)
Pt presented to have cam boot fitted, she is carrying her size medium. Pt states she wears a size 7 shoe. In our cam walkers that is a small, I informed pt. Pt states the boot hits and hurts the mid-shin when she walks. I told pt she would benefit from the tall cam boot, because it does not hit the mid-shin when walking and also stabilizes tendons that work with the leg and foot, which would allow the irritated area not to continue to irritate itself while walking. I fitted pt with the tall small cam boot and she said it did feel much better.

## 2017-10-24 ENCOUNTER — Ambulatory Visit (HOSPITAL_COMMUNITY)
Admission: RE | Admit: 2017-10-24 | Discharge: 2017-10-24 | Disposition: A | Discharge status: Home / Self Care | Payer: 59 | Source: Ambulatory Visit | Attending: Podiatry | Admitting: Podiatry

## 2017-10-24 DIAGNOSIS — M79672 Pain in left foot: Secondary | ICD-10-CM | POA: Diagnosis not present

## 2017-10-24 DIAGNOSIS — M779 Enthesopathy, unspecified: Secondary | ICD-10-CM | POA: Diagnosis not present

## 2017-10-28 ENCOUNTER — Encounter: Payer: Self-pay | Admitting: Podiatry

## 2017-10-28 ENCOUNTER — Ambulatory Visit (INDEPENDENT_AMBULATORY_CARE_PROVIDER_SITE_OTHER): Payer: 59 | Admitting: Podiatry

## 2017-10-28 DIAGNOSIS — M779 Enthesopathy, unspecified: Secondary | ICD-10-CM | POA: Diagnosis not present

## 2017-10-30 NOTE — Progress Notes (Signed)
Subjective:    Patient ID: Priscilla Underwood, female   DOB: 56 y.o.   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

## 2017-11-18 ENCOUNTER — Ambulatory Visit: Payer: 59 | Admitting: Podiatry

## 2017-11-24 DIAGNOSIS — G4700 Insomnia, unspecified: Secondary | ICD-10-CM | POA: Diagnosis not present

## 2017-11-24 DIAGNOSIS — K219 Gastro-esophageal reflux disease without esophagitis: Secondary | ICD-10-CM | POA: Diagnosis not present

## 2017-11-24 DIAGNOSIS — Z0000 Encounter for general adult medical examination without abnormal findings: Secondary | ICD-10-CM | POA: Diagnosis not present

## 2017-11-24 DIAGNOSIS — F329 Major depressive disorder, single episode, unspecified: Secondary | ICD-10-CM | POA: Diagnosis not present

## 2017-11-30 MED FILL — DICLOFENAC SODIUM 75 MG TAB: 75 | 30 days supply | Qty: 60 | Fill #4

## 2017-11-30 MED FILL — PANTOPRAZOLE SOD DR 40 MG T: 40 | 30 days supply | Qty: 60 | Fill #1

## 2017-11-30 MED FILL — SERTRALINE HCL 100 MG TAB: 100 | 30 days supply | Qty: 30 | Fill #1

## 2017-12-02 MED FILL — ZOLPIDEM TARTRATE 5 MG TABL: 5 | 30 days supply | Qty: 30 | Fill #2

## 2017-12-02 MED FILL — NAPROXEN 500 MG TABLET: 500 | 30 days supply | Qty: 60 | Fill #0

## 2017-12-10 MED FILL — LYRICA 75 MG CAPSULE: 75 | 30 days supply | Qty: 30 | Fill #0

## 2018-01-01 ENCOUNTER — Other Ambulatory Visit: Payer: Self-pay | Admitting: Family Medicine

## 2018-01-01 DIAGNOSIS — Z139 Encounter for screening, unspecified: Secondary | ICD-10-CM

## 2018-01-02 MED FILL — SERTRALINE HCL 100 MG TAB: 100 | 30 days supply | Qty: 30 | Fill #2

## 2018-01-02 MED FILL — ZOLPIDEM TARTRATE 5 MG TABL: 5 | 30 days supply | Qty: 30 | Fill #3

## 2018-01-04 MED FILL — MELOXICAM 15 MG TABLET: 15 | 30 days supply | Qty: 30 | Fill #0

## 2018-01-11 DIAGNOSIS — M797 Fibromyalgia: Secondary | ICD-10-CM | POA: Diagnosis not present

## 2018-01-11 DIAGNOSIS — M7062 Trochanteric bursitis, left hip: Secondary | ICD-10-CM | POA: Diagnosis not present

## 2018-01-11 DIAGNOSIS — Z79899 Other long term (current) drug therapy: Secondary | ICD-10-CM | POA: Diagnosis not present

## 2018-01-11 DIAGNOSIS — Z6834 Body mass index (BMI) 34.0-34.9, adult: Secondary | ICD-10-CM | POA: Diagnosis not present

## 2018-01-11 DIAGNOSIS — E669 Obesity, unspecified: Secondary | ICD-10-CM | POA: Diagnosis not present

## 2018-01-11 DIAGNOSIS — M2550 Pain in unspecified joint: Secondary | ICD-10-CM | POA: Diagnosis not present

## 2018-01-11 MED FILL — METHOCARBAMOL 500 MG TABS: 500 | 30 days supply | Qty: 90 | Fill #0

## 2018-02-01 ENCOUNTER — Ambulatory Visit: Payer: 59

## 2018-02-02 MED FILL — SERTRALINE HCL 100 MG TAB: 100 | 30 days supply | Qty: 30 | Fill #3

## 2018-02-02 MED FILL — ZOLPIDEM TARTRATE 5 MG TABL: 5 | 30 days supply | Qty: 30 | Fill #4

## 2018-02-02 MED FILL — PANTOPRAZOLE SOD DR 40 MG T: 40 | 30 days supply | Qty: 60 | Fill #2

## 2018-02-04 DIAGNOSIS — Z136 Encounter for screening for cardiovascular disorders: Secondary | ICD-10-CM | POA: Diagnosis not present

## 2018-02-04 DIAGNOSIS — Z131 Encounter for screening for diabetes mellitus: Secondary | ICD-10-CM | POA: Diagnosis not present

## 2018-02-14 MED FILL — valACYclovir HCL 1 GM TABS: 1 | 30 days supply | Qty: 10 | Fill #1

## 2018-03-04 MED FILL — SERTRALINE HCL 100 MG TAB: 100 | 30 days supply | Qty: 30 | Fill #4

## 2018-03-04 MED FILL — ZOLPIDEM TARTRATE 5 MG TABL: 5 | 30 days supply | Qty: 30 | Fill #5

## 2018-03-04 MED FILL — METHOCARBAMOL 500 MG TABS: 500 | 30 days supply | Qty: 90 | Fill #1

## 2018-03-09 DIAGNOSIS — F329 Major depressive disorder, single episode, unspecified: Secondary | ICD-10-CM | POA: Diagnosis not present

## 2018-03-09 DIAGNOSIS — R1011 Right upper quadrant pain: Secondary | ICD-10-CM | POA: Diagnosis not present

## 2018-03-10 ENCOUNTER — Other Ambulatory Visit (HOSPITAL_BASED_OUTPATIENT_CLINIC_OR_DEPARTMENT_OTHER): Payer: Self-pay | Admitting: Family Medicine

## 2018-03-10 DIAGNOSIS — R1084 Generalized abdominal pain: Secondary | ICD-10-CM

## 2018-03-17 DIAGNOSIS — R030 Elevated blood-pressure reading, without diagnosis of hypertension: Secondary | ICD-10-CM | POA: Diagnosis not present

## 2018-03-17 DIAGNOSIS — Z6834 Body mass index (BMI) 34.0-34.9, adult: Secondary | ICD-10-CM | POA: Diagnosis not present

## 2018-03-17 DIAGNOSIS — M5416 Radiculopathy, lumbar region: Secondary | ICD-10-CM | POA: Diagnosis not present

## 2018-03-18 ENCOUNTER — Encounter (HOSPITAL_COMMUNITY): Payer: Self-pay

## 2018-03-18 ENCOUNTER — Ambulatory Visit (HOSPITAL_COMMUNITY)
Admission: RE | Admit: 2018-03-18 | Discharge: 2018-03-18 | Disposition: A | Discharge status: Home / Self Care | Payer: 59 | Source: Ambulatory Visit | Attending: Family Medicine | Admitting: Family Medicine

## 2018-03-18 DIAGNOSIS — R1084 Generalized abdominal pain: Secondary | ICD-10-CM | POA: Insufficient documentation

## 2018-03-18 DIAGNOSIS — K5790 Diverticulosis of intestine, part unspecified, without perforation or abscess without bleeding: Secondary | ICD-10-CM | POA: Diagnosis not present

## 2018-03-18 DIAGNOSIS — M5136 Other intervertebral disc degeneration, lumbar region: Secondary | ICD-10-CM | POA: Diagnosis not present

## 2018-03-18 DIAGNOSIS — K5730 Diverticulosis of large intestine without perforation or abscess without bleeding: Secondary | ICD-10-CM | POA: Insufficient documentation

## 2018-03-18 MED ORDER — IOHEXOL 300 MG/ML  SOLN
100.0000 mL | Freq: Once | INTRAMUSCULAR | Status: AC | PRN
  Administered 2018-03-18: 100 mL via INTRAVENOUS

## 2018-03-29 ENCOUNTER — Ambulatory Visit
Admission: RE | Admit: 2018-03-29 | Discharge: 2018-03-29 | Disposition: A | Discharge status: Home / Self Care | Payer: 59 | Source: Ambulatory Visit | Attending: Family Medicine | Admitting: Family Medicine

## 2018-03-29 DIAGNOSIS — Z1231 Encounter for screening mammogram for malignant neoplasm of breast: Secondary | ICD-10-CM | POA: Diagnosis not present

## 2018-03-29 DIAGNOSIS — Z139 Encounter for screening, unspecified: Secondary | ICD-10-CM

## 2018-03-29 MED FILL — SERTRALINE HCL 100 MG TAB: 100 | 30 days supply | Qty: 45 | Fill #0

## 2018-04-01 MED FILL — ZOLPIDEM TARTRATE 5 MG TAB: 5 | 30 days supply | Qty: 30 | Fill #0

## 2018-04-09 DIAGNOSIS — M150 Primary generalized (osteo)arthritis: Secondary | ICD-10-CM | POA: Diagnosis not present

## 2018-04-09 DIAGNOSIS — Z6833 Body mass index (BMI) 33.0-33.9, adult: Secondary | ICD-10-CM | POA: Diagnosis not present

## 2018-04-09 DIAGNOSIS — M5431 Sciatica, right side: Secondary | ICD-10-CM | POA: Diagnosis not present

## 2018-04-09 DIAGNOSIS — M797 Fibromyalgia: Secondary | ICD-10-CM | POA: Diagnosis not present

## 2018-04-09 DIAGNOSIS — M2550 Pain in unspecified joint: Secondary | ICD-10-CM | POA: Diagnosis not present

## 2018-04-09 DIAGNOSIS — E669 Obesity, unspecified: Secondary | ICD-10-CM | POA: Diagnosis not present

## 2018-04-12 DIAGNOSIS — D170 Benign lipomatous neoplasm of skin and subcutaneous tissue of head, face and neck: Secondary | ICD-10-CM | POA: Diagnosis not present

## 2018-04-14 MED FILL — PANTOPRAZOLE SOD DR 40 MG T: 40 | 30 days supply | Qty: 60 | Fill #0

## 2018-04-21 DIAGNOSIS — R030 Elevated blood-pressure reading, without diagnosis of hypertension: Secondary | ICD-10-CM | POA: Diagnosis not present

## 2018-04-21 DIAGNOSIS — M5416 Radiculopathy, lumbar region: Secondary | ICD-10-CM | POA: Diagnosis not present

## 2018-04-21 DIAGNOSIS — Z6833 Body mass index (BMI) 33.0-33.9, adult: Secondary | ICD-10-CM | POA: Diagnosis not present

## 2018-04-22 MED FILL — DICLOFENAC SODIUM 1% GEL: 1 | 30 days supply | Qty: 100 | Fill #1

## 2018-05-01 MED FILL — SERTRALINE HCL 100 MG TAB: 100 | 30 days supply | Qty: 45 | Fill #1

## 2018-05-01 MED FILL — ZOLPIDEM TARTRATE 5 MG TAB: 5 | 30 days supply | Qty: 30 | Fill #1

## 2018-05-04 MED FILL — METHOCARBAMOL 500 MG TABLET: 500 | 30 days supply | Qty: 90 | Fill #2

## 2018-05-04 MED FILL — valACYclovir HCL 1 GM TABS: 1 | 30 days supply | Qty: 10 | Fill #0

## 2018-05-10 DIAGNOSIS — M5416 Radiculopathy, lumbar region: Secondary | ICD-10-CM | POA: Diagnosis not present

## 2018-05-11 ENCOUNTER — Other Ambulatory Visit (HOSPITAL_COMMUNITY): Payer: Self-pay | Admitting: Student

## 2018-05-11 DIAGNOSIS — M5416 Radiculopathy, lumbar region: Secondary | ICD-10-CM

## 2018-05-12 DIAGNOSIS — D170 Benign lipomatous neoplasm of skin and subcutaneous tissue of head, face and neck: Secondary | ICD-10-CM | POA: Diagnosis not present

## 2018-05-14 ENCOUNTER — Ambulatory Visit (HOSPITAL_COMMUNITY)
Admission: RE | Admit: 2018-05-14 | Discharge: 2018-05-14 | Disposition: A | Discharge status: Home / Self Care | Payer: 59 | Source: Ambulatory Visit | Attending: Student | Admitting: Student

## 2018-05-14 DIAGNOSIS — M5416 Radiculopathy, lumbar region: Secondary | ICD-10-CM

## 2018-05-14 DIAGNOSIS — M48061 Spinal stenosis, lumbar region without neurogenic claudication: Secondary | ICD-10-CM | POA: Diagnosis not present

## 2018-05-14 DIAGNOSIS — M545 Low back pain: Secondary | ICD-10-CM | POA: Diagnosis not present

## 2018-05-14 DIAGNOSIS — M4726 Other spondylosis with radiculopathy, lumbar region: Secondary | ICD-10-CM | POA: Diagnosis not present

## 2018-05-20 ENCOUNTER — Encounter (HOSPITAL_BASED_OUTPATIENT_CLINIC_OR_DEPARTMENT_OTHER): Payer: Self-pay | Admitting: *Deleted

## 2018-05-21 MED FILL — traMADol HCL 50 MG TABS: 50 | 30 days supply | Qty: 30 | Fill #0

## 2018-05-25 ENCOUNTER — Other Ambulatory Visit: Payer: Self-pay | Admitting: Otolaryngology

## 2018-05-28 ENCOUNTER — Encounter (HOSPITAL_BASED_OUTPATIENT_CLINIC_OR_DEPARTMENT_OTHER): Payer: Self-pay | Admitting: *Deleted

## 2018-05-28 ENCOUNTER — Ambulatory Visit (HOSPITAL_BASED_OUTPATIENT_CLINIC_OR_DEPARTMENT_OTHER)
Admission: RE | Admit: 2018-05-28 | Discharge: 2018-05-28 | Disposition: A | Discharge status: Home / Self Care | Payer: 59 | Source: Ambulatory Visit | Attending: Otolaryngology | Admitting: Otolaryngology

## 2018-05-28 ENCOUNTER — Ambulatory Visit (HOSPITAL_BASED_OUTPATIENT_CLINIC_OR_DEPARTMENT_OTHER): Payer: 59 | Admitting: Anesthesiology

## 2018-05-28 ENCOUNTER — Other Ambulatory Visit: Payer: Self-pay

## 2018-05-28 ENCOUNTER — Encounter (HOSPITAL_BASED_OUTPATIENT_CLINIC_OR_DEPARTMENT_OTHER)
Admission: RE | Disposition: A | Discharge status: Home / Self Care | Payer: Self-pay | Source: Ambulatory Visit | Attending: Otolaryngology

## 2018-05-28 DIAGNOSIS — M797 Fibromyalgia: Secondary | ICD-10-CM | POA: Insufficient documentation

## 2018-05-28 DIAGNOSIS — F329 Major depressive disorder, single episode, unspecified: Secondary | ICD-10-CM | POA: Insufficient documentation

## 2018-05-28 DIAGNOSIS — Z87891 Personal history of nicotine dependence: Secondary | ICD-10-CM | POA: Diagnosis not present

## 2018-05-28 DIAGNOSIS — Z791 Long term (current) use of non-steroidal anti-inflammatories (NSAID): Secondary | ICD-10-CM | POA: Diagnosis not present

## 2018-05-28 DIAGNOSIS — Z803 Family history of malignant neoplasm of breast: Secondary | ICD-10-CM | POA: Diagnosis not present

## 2018-05-28 DIAGNOSIS — Z881 Allergy status to other antibiotic agents status: Secondary | ICD-10-CM | POA: Diagnosis not present

## 2018-05-28 DIAGNOSIS — M1990 Unspecified osteoarthritis, unspecified site: Secondary | ICD-10-CM | POA: Diagnosis not present

## 2018-05-28 DIAGNOSIS — K219 Gastro-esophageal reflux disease without esophagitis: Secondary | ICD-10-CM | POA: Insufficient documentation

## 2018-05-28 DIAGNOSIS — Z79899 Other long term (current) drug therapy: Secondary | ICD-10-CM | POA: Insufficient documentation

## 2018-05-28 DIAGNOSIS — D170 Benign lipomatous neoplasm of skin and subcutaneous tissue of head, face and neck: Secondary | ICD-10-CM | POA: Insufficient documentation

## 2018-05-28 DIAGNOSIS — D234 Other benign neoplasm of skin of scalp and neck: Secondary | ICD-10-CM | POA: Diagnosis not present

## 2018-05-28 HISTORY — PX: LIPOMA EXCISION: SHX5283

## 2018-05-28 SURGERY — EXCISION LIPOMA
Anesthesia: General | Site: Head | Laterality: Right

## 2018-05-28 MED ORDER — LIDOCAINE HCL (CARDIAC) PF 100 MG/5ML IV SOSY
PREFILLED_SYRINGE | INTRAVENOUS | Status: AC
  Filled 2018-05-28: qty 5

## 2018-05-28 MED ORDER — MIDAZOLAM HCL 2 MG/2ML IJ SOLN: 1.0000 mg | INTRAMUSCULAR | Status: DC | PRN

## 2018-05-28 MED ORDER — HYDROCODONE-ACETAMINOPHEN 5-325 MG PO TABS: 1.0000 | ORAL_TABLET | Freq: Four times a day (QID) | ORAL | 0 refills | Status: AC | PRN

## 2018-05-28 MED ORDER — LIDOCAINE 2% (20 MG/ML) 5 ML SYRINGE
INTRAMUSCULAR | Status: DC | PRN
  Administered 2018-05-28: 80 mg via INTRAVENOUS

## 2018-05-28 MED ORDER — LACTATED RINGERS IV SOLN
INTRAVENOUS | Status: DC
  Administered 2018-05-28 (×3): via INTRAVENOUS

## 2018-05-28 MED ORDER — CEPHALEXIN 500 MG PO CAPS: 500.0000 mg | ORAL_CAPSULE | Freq: Three times a day (TID) | ORAL | 1 refills | Status: DC

## 2018-05-28 MED ORDER — DIPHENHYDRAMINE HCL 50 MG/ML IJ SOLN
INTRAMUSCULAR | Status: AC
  Filled 2018-05-28: qty 1

## 2018-05-28 MED ORDER — SUGAMMADEX SODIUM 200 MG/2ML IV SOLN
INTRAVENOUS | Status: DC | PRN
  Administered 2018-05-28: 200 mg via INTRAVENOUS

## 2018-05-28 MED ORDER — SCOPOLAMINE 1 MG/3DAYS TD PT72
1.0000 | MEDICATED_PATCH | Freq: Once | TRANSDERMAL | Status: AC | PRN
  Administered 2018-05-28: 1 via TRANSDERMAL

## 2018-05-28 MED ORDER — SCOPOLAMINE 1 MG/3DAYS TD PT72
MEDICATED_PATCH | TRANSDERMAL | Status: AC
  Filled 2018-05-28: qty 1

## 2018-05-28 MED ORDER — CEFAZOLIN SODIUM-DEXTROSE 2-4 GM/100ML-% IV SOLN
2000.0000 mg | Freq: Once | INTRAVENOUS | Status: AC
  Administered 2018-05-28: 2 g via INTRAVENOUS

## 2018-05-28 MED ORDER — CEFAZOLIN SODIUM 1 G IJ SOLR
INTRAMUSCULAR | Status: AC
  Filled 2018-05-28: qty 20

## 2018-05-28 MED ORDER — CHLORHEXIDINE GLUCONATE CLOTH 2 % EX PADS: 6.0000 | MEDICATED_PAD | Freq: Once | CUTANEOUS | Status: DC

## 2018-05-28 MED ORDER — LIDOCAINE-EPINEPHRINE 1 %-1:100000 IJ SOLN
INTRAMUSCULAR | Status: AC
  Filled 2018-05-28: qty 1

## 2018-05-28 MED ORDER — PHENYLEPHRINE HCL 10 MG/ML IJ SOLN
INTRAMUSCULAR | Status: DC | PRN
  Administered 2018-05-28 (×2): 80 ug via INTRAVENOUS
  Administered 2018-05-28 (×2): 100 ug via INTRAVENOUS

## 2018-05-28 MED ORDER — DEXAMETHASONE SODIUM PHOSPHATE 10 MG/ML IJ SOLN
INTRAMUSCULAR | Status: DC | PRN
  Administered 2018-05-28: 10 mg via INTRAVENOUS

## 2018-05-28 MED ORDER — FENTANYL CITRATE (PF) 100 MCG/2ML IJ SOLN
INTRAMUSCULAR | Status: DC | PRN
  Administered 2018-05-28 (×2): 25 ug via INTRAVENOUS
  Administered 2018-05-28: 100 ug via INTRAVENOUS
  Administered 2018-05-28: 50 ug via INTRAVENOUS

## 2018-05-28 MED ORDER — ROCURONIUM BROMIDE 10 MG/ML (PF) SYRINGE
PREFILLED_SYRINGE | INTRAVENOUS | Status: DC | PRN
  Administered 2018-05-28: 50 mg via INTRAVENOUS

## 2018-05-28 MED ORDER — FENTANYL CITRATE (PF) 100 MCG/2ML IJ SOLN: 25.0000 ug | INTRAMUSCULAR | Status: DC | PRN

## 2018-05-28 MED ORDER — PROMETHAZINE HCL 25 MG/ML IJ SOLN: 6.2500 mg | INTRAMUSCULAR | Status: DC | PRN

## 2018-05-28 MED ORDER — DIPHENHYDRAMINE HCL 50 MG/ML IJ SOLN
INTRAMUSCULAR | Status: DC | PRN
  Administered 2018-05-28: 12.5 mg via INTRAVENOUS

## 2018-05-28 MED ORDER — LIDOCAINE-EPINEPHRINE 1 %-1:100000 IJ SOLN
INTRAMUSCULAR | Status: DC | PRN
  Administered 2018-05-28: 2 mL

## 2018-05-28 MED ORDER — FENTANYL CITRATE (PF) 100 MCG/2ML IJ SOLN
INTRAMUSCULAR | Status: AC
  Filled 2018-05-28: qty 2

## 2018-05-28 MED ORDER — ONDANSETRON HCL 4 MG/2ML IJ SOLN
INTRAMUSCULAR | Status: AC
  Filled 2018-05-28: qty 2

## 2018-05-28 MED ORDER — PROPOFOL 10 MG/ML IV BOLUS
INTRAVENOUS | Status: DC | PRN
  Administered 2018-05-28: 30 mg via INTRAVENOUS
  Administered 2018-05-28: 170 mg via INTRAVENOUS

## 2018-05-28 MED ORDER — KETOROLAC TROMETHAMINE 30 MG/ML IJ SOLN
INTRAMUSCULAR | Status: DC | PRN
  Administered 2018-05-28: 30 mg via INTRAVENOUS

## 2018-05-28 MED ORDER — PROPOFOL 10 MG/ML IV BOLUS
INTRAVENOUS | Status: AC
  Filled 2018-05-28: qty 20

## 2018-05-28 MED ORDER — MIDAZOLAM HCL 2 MG/2ML IJ SOLN
INTRAMUSCULAR | Status: DC | PRN
  Administered 2018-05-28: 2 mg via INTRAVENOUS

## 2018-05-28 MED ORDER — ONDANSETRON HCL 4 MG/2ML IJ SOLN
INTRAMUSCULAR | Status: DC | PRN
  Administered 2018-05-28: 4 mg via INTRAVENOUS

## 2018-05-28 MED ORDER — KETOROLAC TROMETHAMINE 30 MG/ML IJ SOLN
INTRAMUSCULAR | Status: AC
  Filled 2018-05-28: qty 1

## 2018-05-28 MED ORDER — BACITRACIN-NEOMYCIN-POLYMYXIN 400-5-5000 EX OINT
TOPICAL_OINTMENT | CUTANEOUS | Status: AC
  Filled 2018-05-28: qty 1

## 2018-05-28 MED ORDER — ROCURONIUM BROMIDE 10 MG/ML (PF) SYRINGE
PREFILLED_SYRINGE | INTRAVENOUS | Status: AC
  Filled 2018-05-28: qty 10

## 2018-05-28 MED ORDER — MIDAZOLAM HCL 2 MG/2ML IJ SOLN
INTRAMUSCULAR | Status: AC
  Filled 2018-05-28: qty 2

## 2018-05-28 MED ORDER — SUGAMMADEX SODIUM 200 MG/2ML IV SOLN
INTRAVENOUS | Status: AC
  Filled 2018-05-28: qty 2

## 2018-05-28 MED ORDER — FENTANYL CITRATE (PF) 100 MCG/2ML IJ SOLN: 50.0000 ug | INTRAMUSCULAR | Status: DC | PRN

## 2018-05-28 MED ORDER — DEXAMETHASONE SODIUM PHOSPHATE 10 MG/ML IJ SOLN
INTRAMUSCULAR | Status: AC
  Filled 2018-05-28: qty 1

## 2018-05-28 MED ORDER — BACITRACIN-NEOMYCIN-POLYMYXIN OINTMENT TUBE
TOPICAL_OINTMENT | CUTANEOUS | Status: DC | PRN
  Administered 2018-05-28: 1 via TOPICAL

## 2018-05-28 MED FILL — CEPHALEXIN 500 MG CAPSULE: 500 | 10 days supply | Qty: 30 | Fill #0

## 2018-05-28 MED FILL — HYDROCODON-APAP 5-325: 5-325 | 5 days supply | Qty: 20 | Fill #0

## 2018-05-28 SURGICAL SUPPLY — 60 items
ADH SKN CLS APL DERMABOND .7 (GAUZE/BANDAGES/DRESSINGS)
BLADE SURG 15 STRL LF DISP TIS (BLADE) ×1 IMPLANT
BLADE SURG 15 STRL SS (BLADE) ×2
CANISTER SUCT 1200ML W/VALVE (MISCELLANEOUS) ×2 IMPLANT
CLEANER CAUTERY TIP 5X5 PAD (MISCELLANEOUS) IMPLANT
CORD BIPOLAR FORCEPS 12FT (ELECTRODE) IMPLANT
COVER BACK TABLE 60X90IN (DRAPES) ×2 IMPLANT
COVER MAYO STAND STRL (DRAPES) ×2 IMPLANT
DECANTER SPIKE VIAL GLASS SM (MISCELLANEOUS) IMPLANT
DERMABOND ADVANCED (GAUZE/BANDAGES/DRESSINGS)
DERMABOND ADVANCED .7 DNX12 (GAUZE/BANDAGES/DRESSINGS) IMPLANT
DRAIN JP 10F RND SILICONE (MISCELLANEOUS) IMPLANT
DRAIN PENROSE 1/4X12 LTX STRL (WOUND CARE) IMPLANT
DRAPE U-SHAPE 76X120 STRL (DRAPES) ×2 IMPLANT
ELECT COATED BLADE 2.86 ST (ELECTRODE) ×2 IMPLANT
ELECT NDL BLADE 2-5/6 (NEEDLE) IMPLANT
ELECT NEEDLE BLADE 2-5/6 (NEEDLE) IMPLANT
ELECT REM PT RETURN 9FT ADLT (ELECTROSURGICAL) ×2
ELECTRODE REM PT RTRN 9FT ADLT (ELECTROSURGICAL) ×1 IMPLANT
GAUZE SPONGE 4X4 12PLY STRL LF (GAUZE/BANDAGES/DRESSINGS) IMPLANT
GAUZE SPONGE 4X4 16PLY XRAY LF (GAUZE/BANDAGES/DRESSINGS) IMPLANT
GLOVE BIO SURGEON STRL SZ 6.5 (GLOVE) ×1 IMPLANT
GLOVE BIOGEL M 7.0 STRL (GLOVE) ×2 IMPLANT
GLOVE BIOGEL PI IND STRL 7.0 (GLOVE) IMPLANT
GLOVE BIOGEL PI IND STRL 8 (GLOVE) IMPLANT
GLOVE BIOGEL PI INDICATOR 7.0 (GLOVE) ×1
GLOVE BIOGEL PI INDICATOR 8 (GLOVE) ×1
GOWN STRL REUS W/ TWL LRG LVL3 (GOWN DISPOSABLE) ×2 IMPLANT
GOWN STRL REUS W/TWL LRG LVL3 (GOWN DISPOSABLE) ×4
NDL HYPO 25X1 1.5 SAFETY (NEEDLE) ×1 IMPLANT
NEEDLE HYPO 25X1 1.5 SAFETY (NEEDLE) ×2 IMPLANT
NS IRRIG 1000ML POUR BTL (IV SOLUTION) ×2 IMPLANT
PACK BASIN DAY SURGERY FS (CUSTOM PROCEDURE TRAY) ×2 IMPLANT
PAD CLEANER CAUTERY TIP 5X5 (MISCELLANEOUS) ×1
PENCIL BUTTON HOLSTER BLD 10FT (ELECTRODE) ×2 IMPLANT
PIN SAFETY STERILE (MISCELLANEOUS) IMPLANT
SHEET MEDIUM DRAPE 40X70 STRL (DRAPES) IMPLANT
SLEEVE SCD COMPRESS KNEE MED (MISCELLANEOUS) ×1 IMPLANT
SPONGE GAUZE 2X2 8PLY STRL LF (GAUZE/BANDAGES/DRESSINGS) ×2 IMPLANT
STAPLER VISISTAT 35W (STAPLE) IMPLANT
SUT CHROMIC 3 0 SH 27 (SUTURE) IMPLANT
SUT ETHILON 3 0 PS 1 (SUTURE) IMPLANT
SUT ETHILON 4 0 PS 2 18 (SUTURE) IMPLANT
SUT ETHILON 5 0 P 3 18 (SUTURE)
SUT NOVAFIL 5 0 BLK 18 IN P13 (SUTURE) IMPLANT
SUT NYLON ETHILON 5-0 P-3 1X18 (SUTURE) IMPLANT
SUT SILK 3 0 TIES 17X18 (SUTURE)
SUT SILK 3-0 18XBRD TIE BLK (SUTURE) IMPLANT
SUT SILK 4 0 TIES 17X18 (SUTURE) IMPLANT
SUT VIC AB 3-0 FS2 27 (SUTURE) ×1 IMPLANT
SUT VIC AB 4-0 RB1 27 (SUTURE)
SUT VIC AB 4-0 RB1 27X BRD (SUTURE) IMPLANT
SUT VIC AB 5-0 P-3 18X BRD (SUTURE) IMPLANT
SUT VIC AB 5-0 P3 18 (SUTURE)
SUT VICRYL 4-0 PS2 18IN ABS (SUTURE) IMPLANT
SYR BULB 3OZ (MISCELLANEOUS) ×1 IMPLANT
SYR CONTROL 10ML LL (SYRINGE) ×2 IMPLANT
TOWEL GREEN STERILE FF (TOWEL DISPOSABLE) ×4 IMPLANT
TRAY DSU PREP LF (CUSTOM PROCEDURE TRAY) ×2 IMPLANT
TUBE CONNECTING 20X1/4 (TUBING) ×2 IMPLANT

## 2018-05-28 NOTE — Anesthesia Preprocedure Evaluation (Addendum)
Anesthesia Evaluation  Patient identified by MRN, date of birth, ID band Patient awake    Reviewed: Allergy & Precautions, NPO status , Patient's Chart, lab work & pertinent test results  History of Anesthesia Complications (+) PONV and history of anesthetic complications  Airway Mallampati: II  TM Distance: >3 FB Neck ROM: Full    Dental no notable dental hx. (+) Dental Advisory Given   Pulmonary former smoker,    Pulmonary exam normal        Cardiovascular negative cardio ROS Normal cardiovascular exam     Neuro/Psych Depression negative neurological ROS     GI/Hepatic Neg liver ROS, GERD  ,  Endo/Other  negative endocrine ROS  Renal/GU negative Renal ROS     Musculoskeletal  (+) Arthritis , Fibromyalgia -  Abdominal   Peds  Hematology   Anesthesia Other Findings   Reproductive/Obstetrics                            Anesthesia Physical Anesthesia Plan  ASA: II  Anesthesia Plan: General   Post-op Pain Management:    Induction: Intravenous  PONV Risk Score and Plan: 4 or greater and Ondansetron, Dexamethasone, Scopolamine patch - Pre-op and Diphenhydramine  Airway Management Planned: LMA and Oral ETT  Additional Equipment:   Intra-op Plan:   Post-operative Plan: Extubation in OR  Informed Consent: I have reviewed the patients History and Physical, chart, labs and discussed the procedure including the risks, benefits and alternatives for the proposed anesthesia with the patient or authorized representative who has indicated his/her understanding and acceptance.   Dental advisory given  Plan Discussed with: CRNA and Anesthesiologist  Anesthesia Plan Comments:         Anesthesia Quick Evaluation                                  Anesthesia Evaluation  Patient identified by MRN, date of birth, ID band Patient awake    Reviewed: Allergy & Precautions, NPO status ,  Patient's Chart, lab work & pertinent test results  History of Anesthesia Complications (+) PONV and history of anesthetic complications  Airway Mallampati: I  TM Distance: >3 FB Neck ROM: Full    Dental  (+) Dental Advisory Given   Pulmonary neg pulmonary ROS, former smoker,    breath sounds clear to auscultation       Cardiovascular negative cardio ROS   Rhythm:Regular Rate:Normal     Neuro/Psych Depression negative neurological ROS     GI/Hepatic Neg liver ROS, GERD  Medicated and Controlled,  Endo/Other  Morbid obesity  Renal/GU negative Renal ROS     Musculoskeletal  (+) Fibromyalgia -  Abdominal (+) + obese,   Peds  Hematology negative hematology ROS (+)   Anesthesia Other Findings   Reproductive/Obstetrics                           Anesthesia Physical Anesthesia Plan  ASA: II  Anesthesia Plan: MAC   Post-op Pain Management:    Induction: Intravenous  Airway Management Planned: Natural Airway and Nasal Cannula  Additional Equipment:   Intra-op Plan:   Post-operative Plan:   Informed Consent: I have reviewed the patients History and Physical, chart, labs and discussed the procedure including the risks, benefits and alternatives for the proposed anesthesia with the patient or authorized representative who has  indicated his/her understanding and acceptance.   Dental advisory given  Plan Discussed with: CRNA and Surgeon  Anesthesia Plan Comments: (Plan routine monitors, MAC)        Anesthesia Quick Evaluation

## 2018-05-28 NOTE — Transfer of Care (Signed)
Immediate Anesthesia Transfer of Care Note  Patient: Priscilla Underwood  Procedure(s) Performed: EXCISION LIPOMA OF SCALP (Right Head)  Patient Location: PACU  Anesthesia Type:General  Level of Consciousness: awake, alert , oriented, drowsy and patient cooperative  Airway & Oxygen Therapy: Patient Spontanous Breathing and Patient connected to face mask oxygen  Post-op Assessment: Report given to RN and Post -op Vital signs reviewed and stable  Post vital signs: Reviewed and stable  Last Vitals:  Vitals Value Taken Time  BP 141/86 05/28/2018  9:25 AM  Temp    Pulse 87 05/28/2018  9:29 AM  Resp 13 05/28/2018  9:29 AM  SpO2 100 % 05/28/2018  9:29 AM  Vitals shown include unvalidated device data.  Last Pain:  Vitals:   05/28/18 7290  TempSrc: Oral  PainSc: 4          Complications: No apparent anesthesia complications

## 2018-05-28 NOTE — Anesthesia Procedure Notes (Signed)
Procedure Name: Intubation Date/Time: 05/28/2018 7:53 AM Performed by: Raenette Rover, CRNA Pre-anesthesia Checklist: Patient identified, Emergency Drugs available, Suction available and Patient being monitored Patient Re-evaluated:Patient Re-evaluated prior to induction Oxygen Delivery Method: Circle system utilized Preoxygenation: Pre-oxygenation with 100% oxygen Induction Type: IV induction Ventilation: Mask ventilation without difficulty Laryngoscope Size: Mac and 3 Grade View: Grade I Tube type: Oral Tube size: 7.0 mm Number of attempts: 1 Airway Equipment and Method: Stylet Placement Confirmation: ETT inserted through vocal cords under direct vision,  positive ETCO2,  CO2 detector and breath sounds checked- equal and bilateral Secured at: 20 cm Tube secured with: Tape Dental Injury: Teeth and Oropharynx as per pre-operative assessment

## 2018-05-28 NOTE — Op Note (Signed)
Operative Note: EXCISION OF SCALP LIPOMA WITH RECONSTRUCTION  Patient: Priscilla Underwood  Medical record number: [id number]  Date:05/28/2018  Pre-operative Indications: Recurrent right posterior scalp lipoma  Postoperative Indications: Same  Surgical Procedure:  1.  Excision of 4 cm recurrent scalp lipoma    2.  Reconstruction of 4 cm scalp defect  Anesthesia: GET  Surgeon: Delsa Bern, M.D.  Assist: None  Complications: None  EBL: 50 cc   Brief History: The patient is a 57 y.o. female with a history of a soft tissue swelling in the right posterior scalp region.  The patient reports previous excision over 10 years ago performed as a procedure under local anesthetic.  The patient noted gradual enlargement of the mass over the ensuing years with discomfort particularly when lying on her back.  No acute change and no evidence of infection. Given the patient's history and findings I recommended excision of right posterior scalp mass and immediate reconstruction under general anesthesia, risks and benefits were discussed in detail with the patient and her family. They understand and agree with our plan for surgery which is scheduled at [facility] on an elective basis.  Surgical Procedure: The patient is brought to the operating room on 05/28/2018 and placed in supine position on the operating table. General endotracheal anesthesia was established without difficulty. When the patient was adequately anesthetized, surgical timeout was performed and correct identification of the patient and the surgical procedure. The patient was positioned and prepped and draped in sterile fashion.  The patient was positioned in a left lateral position and padded appropriately.  The skin overlying the soft tissue mass was then injected with 1% lidocaine 1 100,000 dilution epinephrine for a total of 1 cc.  After allowing adequate time for vasoconstriction and hemostasis the patient was prepped and draped.   Surgical procedure was begun with excision of the posterior mass.  A 4 cm incision was made in the scalp after parting her hair.  Incision was angled with the hair follicles to reduce the risk of postoperative alopecia.  The scalp and underlying soft tissue were then carefully dissected.  The patient was found to have a 4 cm soft tissue mass consistent with a lipoma in the immediate subgaleal tissue.  Using blunt and sharp dissection with Bovie electrocautery the entire mass was resected.  Previous excision site was identified and there was a moderate amount of scarring which was carefully dissected using electrocautery.  Several areas of bleeding were then cauterized and suture ligated.  The wound was thoroughly irrigated and there was no further bleeding.  Immediate reconstruction was then undertaken with advancement of soft tissue flaps to allow approximation of the scalp.  Beginning with interrupted 4-0 Vicryl sutures the deep soft tissue was closed, the immediate subcutaneous tissue was then closed with interrupted 4-0 Vicryl suture in a horizontal mattressing fashion.  Final skin edge closure with interrupted surgical staples.  Bacitracin ointment applied to the incision.  The patient was awakened from anesthetic and transferred from the operating room to the recovery room in stable condition. There were no complications and blood loss was minimal.   Delsa Bern, M.D. [facility] 05/28/2018

## 2018-05-28 NOTE — H&P (Signed)
Priscilla Underwood is an 57 y.o. female.   Chief Complaint: Scalp lipoma HPI: Hx of enlarging Scalp lipoma, prev. excision  Past Medical History:  Diagnosis Date  . Arthritis    osteo arthritis  . Depression   . Fibromyalgia   . GERD (gastroesophageal reflux disease)   . PONV (postoperative nausea and vomiting)     Past Surgical History:  Procedure Laterality Date  . BUNIONECTOMY  2004  . CARPAL TUNNEL RELEASE Left 06/15/2015   Procedure: LEFT CARPAL TUNNEL RELEASE;  Surgeon: Roseanne Kaufman, MD;  Location: [place];  Service: Orthopedics;  Laterality: Left;  . CARPAL TUNNEL RELEASE Right 08/31/2015   Procedure: RIGHT LIMITED OPEN CARPAL TUNNEL RELEASE;  Surgeon: Roseanne Kaufman, MD;  Location: [place];  Service: Orthopedics;  Laterality: Right;  . SHOULDER SURGERY  2010, 2008  . TARSAL TUNNEL RELEASE Left 2012  . TUBAL LIGATION  1985    Family History  Problem Relation Age of Onset  . Breast cancer Sister    Social History:  reports that she has quit smoking. Her smoking use included cigarettes. She has never used smokeless tobacco. She reports that she does not drink alcohol or use drugs.  Allergies:  Allergies  Allergen Reactions  . Ciprofloxacin Rash  . Avelox [Moxifloxacin Hcl In Nacl] Rash    Medications Prior to Admission  Medication Sig Dispense Refill  . diclofenac (VOLTAREN) 75 MG EC tablet Take 75 mg by mouth 2 (two) times daily. Reported on 05/07/2016    . methocarbamol (ROBAXIN) 500 MG tablet Take 500 mg by mouth every 8 (eight) hours as needed for muscle spasms.    . pantoprazole (PROTONIX) 40 MG tablet Take 40 mg by mouth daily.    . sertraline (ZOLOFT) 100 MG tablet   5  . sertraline (ZOLOFT) 50 MG tablet Take 50 mg by mouth daily.    . valACYclovir (VALTREX) 1000 MG tablet   2  . zolpidem (AMBIEN) 5 MG tablet   1    No results found for this or any previous visit (from the past 48 hour(s)). No results found.  Review of  Systems  Constitutional: Negative.   HENT: Negative.   Respiratory: Negative.   Cardiovascular: Negative.     Blood pressure 139/81, pulse 63, temperature 98 F (36.7 C), temperature source Oral, resp. rate 20, height 5\' 5"  (1.651 m), weight 90.7 kg (200 lb), SpO2 99 %. Physical Exam  Constitutional: She appears well-developed and well-nourished.  HENT:  Right scalp lipoma  Neck: Normal range of motion. Neck supple.  Cardiovascular: Normal rate.  Respiratory: Effort normal.     Assessment/Plan Adm for OP excision of Scalp lipoma.  Malvina Schadler, MD 05/28/2018, 7:32 AM

## 2018-05-28 NOTE — Discharge Instructions (Signed)

## 2018-05-30 MED FILL — PANTOPRAZOLE SOD DR 40 MG T: 40 | 30 days supply | Qty: 60 | Fill #1

## 2018-05-30 MED FILL — SERTRALINE HCL 100 MG TAB: 100 | 30 days supply | Qty: 45 | Fill #2

## 2018-05-31 ENCOUNTER — Encounter (HOSPITAL_BASED_OUTPATIENT_CLINIC_OR_DEPARTMENT_OTHER): Payer: Self-pay | Admitting: Otolaryngology

## 2018-05-31 DIAGNOSIS — M5416 Radiculopathy, lumbar region: Secondary | ICD-10-CM | POA: Diagnosis not present

## 2018-05-31 MED FILL — DICLOFENAC SOD EC 75 MG TAB: 75 | 30 days supply | Qty: 60 | Fill #0

## 2018-06-04 MED FILL — ZOLPIDEM TARTRATE 5 MG TAB: 5 | 30 days supply | Qty: 30 | Fill #0

## 2018-06-06 NOTE — Anesthesia Postprocedure Evaluation (Signed)
Anesthesia Post Note  Patient: Priscilla Underwood  Procedure(s) Performed: EXCISION LIPOMA OF SCALP (Right Head)     Patient location during evaluation: PACU Anesthesia Type: General Level of consciousness: sedated Pain management: pain level controlled Vital Signs Assessment: post-procedure vital signs reviewed and stable Respiratory status: spontaneous breathing and respiratory function stable Cardiovascular status: stable Postop Assessment: no apparent nausea or vomiting Anesthetic complications: no    Last Vitals:  Vitals:   05/28/18 1000 05/28/18 1039  BP: 102/75 93/81  Pulse: 72 71  Resp: 14 16  Temp:  (!) 36.4 C  SpO2: 100% 96%    Last Pain:  Vitals:   05/31/18 1052  TempSrc:   PainSc: 0-No pain                 Quientin Jent DANIEL

## 2018-06-14 DIAGNOSIS — M6281 Muscle weakness (generalized): Secondary | ICD-10-CM | POA: Diagnosis not present

## 2018-06-14 DIAGNOSIS — R2689 Other abnormalities of gait and mobility: Secondary | ICD-10-CM | POA: Diagnosis not present

## 2018-06-14 DIAGNOSIS — M545 Low back pain: Secondary | ICD-10-CM | POA: Diagnosis not present

## 2018-06-16 DIAGNOSIS — M545 Low back pain: Secondary | ICD-10-CM | POA: Diagnosis not present

## 2018-06-16 DIAGNOSIS — M6281 Muscle weakness (generalized): Secondary | ICD-10-CM | POA: Diagnosis not present

## 2018-06-16 DIAGNOSIS — R2689 Other abnormalities of gait and mobility: Secondary | ICD-10-CM | POA: Diagnosis not present

## 2018-06-21 DIAGNOSIS — M545 Low back pain: Secondary | ICD-10-CM | POA: Diagnosis not present

## 2018-06-21 DIAGNOSIS — R2689 Other abnormalities of gait and mobility: Secondary | ICD-10-CM | POA: Diagnosis not present

## 2018-06-21 DIAGNOSIS — M6281 Muscle weakness (generalized): Secondary | ICD-10-CM | POA: Diagnosis not present

## 2018-06-23 DIAGNOSIS — M545 Low back pain: Secondary | ICD-10-CM | POA: Diagnosis not present

## 2018-06-23 DIAGNOSIS — R2689 Other abnormalities of gait and mobility: Secondary | ICD-10-CM | POA: Diagnosis not present

## 2018-06-23 DIAGNOSIS — M6281 Muscle weakness (generalized): Secondary | ICD-10-CM | POA: Diagnosis not present

## 2018-06-28 DIAGNOSIS — M6281 Muscle weakness (generalized): Secondary | ICD-10-CM | POA: Diagnosis not present

## 2018-06-28 DIAGNOSIS — R2689 Other abnormalities of gait and mobility: Secondary | ICD-10-CM | POA: Diagnosis not present

## 2018-06-28 DIAGNOSIS — M545 Low back pain: Secondary | ICD-10-CM | POA: Diagnosis not present

## 2018-06-30 DIAGNOSIS — M545 Low back pain: Secondary | ICD-10-CM | POA: Diagnosis not present

## 2018-06-30 DIAGNOSIS — M6281 Muscle weakness (generalized): Secondary | ICD-10-CM | POA: Diagnosis not present

## 2018-06-30 DIAGNOSIS — R2689 Other abnormalities of gait and mobility: Secondary | ICD-10-CM | POA: Diagnosis not present

## 2018-07-05 DIAGNOSIS — M545 Low back pain: Secondary | ICD-10-CM | POA: Diagnosis not present

## 2018-07-05 DIAGNOSIS — M6281 Muscle weakness (generalized): Secondary | ICD-10-CM | POA: Diagnosis not present

## 2018-07-05 DIAGNOSIS — R2689 Other abnormalities of gait and mobility: Secondary | ICD-10-CM | POA: Diagnosis not present

## 2018-07-07 DIAGNOSIS — M545 Low back pain: Secondary | ICD-10-CM | POA: Diagnosis not present

## 2018-07-07 DIAGNOSIS — M6281 Muscle weakness (generalized): Secondary | ICD-10-CM | POA: Diagnosis not present

## 2018-07-07 DIAGNOSIS — R2689 Other abnormalities of gait and mobility: Secondary | ICD-10-CM | POA: Diagnosis not present

## 2018-07-12 DIAGNOSIS — M545 Low back pain: Secondary | ICD-10-CM | POA: Diagnosis not present

## 2018-07-12 DIAGNOSIS — M6281 Muscle weakness (generalized): Secondary | ICD-10-CM | POA: Diagnosis not present

## 2018-07-12 DIAGNOSIS — R2689 Other abnormalities of gait and mobility: Secondary | ICD-10-CM | POA: Diagnosis not present

## 2018-07-12 MED FILL — METHOCARBAMOL 500 MG TABLET: 500 | 30 days supply | Qty: 90 | Fill #3

## 2018-07-12 MED FILL — DICLOFENAC SOD EC 75 MG TAB: 75 | 30 days supply | Qty: 60 | Fill #1

## 2018-07-12 MED FILL — SERTRALINE HCL 100 MG TAB: 100 | 30 days supply | Qty: 45 | Fill #3

## 2018-07-12 MED FILL — PANTOPRAZOLE SOD DR 40 MG T: 40 | 30 days supply | Qty: 60 | Fill #2

## 2018-07-13 MED FILL — ZOLPIDEM TARTRATE 5 MG TAB: 5 | 30 days supply | Qty: 30 | Fill #1

## 2018-07-19 DIAGNOSIS — M6281 Muscle weakness (generalized): Secondary | ICD-10-CM | POA: Diagnosis not present

## 2018-07-19 DIAGNOSIS — R2689 Other abnormalities of gait and mobility: Secondary | ICD-10-CM | POA: Diagnosis not present

## 2018-07-19 DIAGNOSIS — M545 Low back pain: Secondary | ICD-10-CM | POA: Diagnosis not present

## 2018-07-22 DIAGNOSIS — R2689 Other abnormalities of gait and mobility: Secondary | ICD-10-CM | POA: Diagnosis not present

## 2018-07-22 DIAGNOSIS — M6281 Muscle weakness (generalized): Secondary | ICD-10-CM | POA: Diagnosis not present

## 2018-07-22 DIAGNOSIS — M545 Low back pain: Secondary | ICD-10-CM | POA: Diagnosis not present

## 2018-07-29 DIAGNOSIS — M6281 Muscle weakness (generalized): Secondary | ICD-10-CM | POA: Diagnosis not present

## 2018-07-29 DIAGNOSIS — R2689 Other abnormalities of gait and mobility: Secondary | ICD-10-CM | POA: Diagnosis not present

## 2018-07-29 DIAGNOSIS — M545 Low back pain: Secondary | ICD-10-CM | POA: Diagnosis not present

## 2018-08-11 DIAGNOSIS — H527 Unspecified disorder of refraction: Secondary | ICD-10-CM | POA: Diagnosis not present

## 2018-08-11 DIAGNOSIS — D3131 Benign neoplasm of right choroid: Secondary | ICD-10-CM | POA: Diagnosis not present

## 2018-08-12 DIAGNOSIS — M545 Low back pain: Secondary | ICD-10-CM | POA: Diagnosis not present

## 2018-08-12 DIAGNOSIS — M6281 Muscle weakness (generalized): Secondary | ICD-10-CM | POA: Diagnosis not present

## 2018-08-12 DIAGNOSIS — R2689 Other abnormalities of gait and mobility: Secondary | ICD-10-CM | POA: Diagnosis not present

## 2018-08-12 MED FILL — SERTRALINE HCL 100 MG TAB: 100 | 30 days supply | Qty: 45 | Fill #4

## 2018-08-23 DIAGNOSIS — M7918 Myalgia, other site: Secondary | ICD-10-CM | POA: Diagnosis not present

## 2018-08-23 DIAGNOSIS — I10 Essential (primary) hypertension: Secondary | ICD-10-CM | POA: Diagnosis not present

## 2018-08-23 MED FILL — traMADol HCL 50 MG TABS: 50 | 15 days supply | Qty: 30 | Fill #0

## 2018-08-23 MED FILL — ZOLPIDEM TARTRATE 5 MG TAB: 5 | 30 days supply | Qty: 30 | Fill #2

## 2018-08-31 MED FILL — PREGABALIN 75 MG CAPS: 75 | 30 days supply | Qty: 30 | Fill #0

## 2018-09-11 MED FILL — SERTRALINE HCL 100 MG TAB: 100 | 30 days supply | Qty: 45 | Fill #5

## 2018-09-11 MED FILL — DICLOFENAC SOD EC 75 MG TAB: 75 | 30 days supply | Qty: 60 | Fill #2

## 2018-09-11 MED FILL — PANTOPRAZOLE SOD DR 40 MG T: 40 | 30 days supply | Qty: 60 | Fill #3

## 2018-09-13 MED FILL — METHOCARBAMOL 500 MG TABLET: 500 | 30 days supply | Qty: 90 | Fill #0

## 2018-09-15 DIAGNOSIS — M25551 Pain in right hip: Secondary | ICD-10-CM | POA: Diagnosis not present

## 2018-09-15 DIAGNOSIS — M25552 Pain in left hip: Secondary | ICD-10-CM | POA: Diagnosis not present

## 2018-09-20 MED FILL — valACYclovir HCL 1 GM TABS: 1 | 30 days supply | Qty: 10 | Fill #0

## 2018-09-22 ENCOUNTER — Other Ambulatory Visit (HOSPITAL_COMMUNITY): Payer: Self-pay | Admitting: Physician Assistant

## 2018-09-22 DIAGNOSIS — M25551 Pain in right hip: Secondary | ICD-10-CM

## 2018-09-29 MED FILL — ZOLPIDEM TARTRATE 5 MG TAB: 5 | 30 days supply | Qty: 30 | Fill #3

## 2018-10-01 ENCOUNTER — Ambulatory Visit (HOSPITAL_COMMUNITY)
Admission: RE | Admit: 2018-10-01 | Discharge: 2018-10-01 | Disposition: A | Discharge status: Home / Self Care | Payer: 59 | Source: Ambulatory Visit | Attending: Physician Assistant | Admitting: Physician Assistant

## 2018-10-01 ENCOUNTER — Encounter (HOSPITAL_COMMUNITY)
Admission: RE | Admit: 2018-10-01 | Discharge: 2018-10-01 | Disposition: A | Discharge status: Home / Self Care | Payer: 59 | Source: Ambulatory Visit | Attending: Physician Assistant | Admitting: Physician Assistant

## 2018-10-01 DIAGNOSIS — M1611 Unilateral primary osteoarthritis, right hip: Secondary | ICD-10-CM | POA: Diagnosis not present

## 2018-10-01 DIAGNOSIS — M25551 Pain in right hip: Secondary | ICD-10-CM | POA: Diagnosis not present

## 2018-10-01 DIAGNOSIS — M25552 Pain in left hip: Secondary | ICD-10-CM | POA: Diagnosis not present

## 2018-10-01 MED ORDER — TECHNETIUM TC 99M MEDRONATE IV KIT: 20.0000 | PACK | Freq: Once | INTRAVENOUS | Status: DC | PRN

## 2018-10-11 MED FILL — SERTRALINE HCL 100 MG TAB: 100 | 30 days supply | Qty: 45 | Fill #0

## 2018-10-13 ENCOUNTER — Other Ambulatory Visit (HOSPITAL_COMMUNITY): Payer: Self-pay | Admitting: Physician Assistant

## 2018-10-13 DIAGNOSIS — M25552 Pain in left hip: Principal | ICD-10-CM

## 2018-10-13 DIAGNOSIS — M25551 Pain in right hip: Secondary | ICD-10-CM | POA: Diagnosis not present

## 2018-10-14 ENCOUNTER — Other Ambulatory Visit (HOSPITAL_COMMUNITY): Payer: Self-pay | Admitting: Physician Assistant

## 2018-10-14 DIAGNOSIS — M25552 Pain in left hip: Principal | ICD-10-CM

## 2018-10-14 DIAGNOSIS — M25551 Pain in right hip: Secondary | ICD-10-CM

## 2018-10-19 ENCOUNTER — Ambulatory Visit (HOSPITAL_COMMUNITY)
Admission: RE | Admit: 2018-10-19 | Discharge: 2018-10-19 | Disposition: A | Discharge status: Home / Self Care | Payer: 59 | Source: Ambulatory Visit | Attending: Physician Assistant | Admitting: Physician Assistant

## 2018-10-19 ENCOUNTER — Encounter (HOSPITAL_COMMUNITY): Payer: Self-pay

## 2018-10-21 ENCOUNTER — Ambulatory Visit (HOSPITAL_COMMUNITY)
Admission: RE | Admit: 2018-10-21 | Discharge: 2018-10-21 | Disposition: A | Discharge status: Home / Self Care | Payer: 59 | Source: Ambulatory Visit | Attending: Physician Assistant | Admitting: Physician Assistant

## 2018-10-21 DIAGNOSIS — M94251 Chondromalacia, right hip: Secondary | ICD-10-CM | POA: Insufficient documentation

## 2018-10-21 DIAGNOSIS — M25552 Pain in left hip: Secondary | ICD-10-CM | POA: Diagnosis not present

## 2018-10-21 DIAGNOSIS — M25551 Pain in right hip: Secondary | ICD-10-CM

## 2018-10-21 DIAGNOSIS — M1611 Unilateral primary osteoarthritis, right hip: Secondary | ICD-10-CM | POA: Diagnosis not present

## 2018-10-28 DIAGNOSIS — M25551 Pain in right hip: Secondary | ICD-10-CM | POA: Diagnosis not present

## 2018-10-31 MED FILL — ZOLPIDEM TARTRATE 5 MG TAB: 5 | 30 days supply | Qty: 30 | Fill #4

## 2018-11-15 MED FILL — DICLOFENAC SODIUM 75 MG TAB: 75 | 30 days supply | Qty: 60 | Fill #3

## 2018-11-15 MED FILL — METHOCARBAMOL 500 MG TABLET: 500 | 30 days supply | Qty: 90 | Fill #0

## 2018-11-15 MED FILL — PANTOPRAZOLE SOD DR 40 MG T: 40 | 30 days supply | Qty: 60 | Fill #4

## 2018-11-17 MED FILL — SERTRALINE HCL 100 MG TAB: 100 | 30 days supply | Qty: 46 | Fill #0

## 2018-11-23 ENCOUNTER — Other Ambulatory Visit: Payer: Self-pay

## 2018-11-23 ENCOUNTER — Ambulatory Visit: Payer: 59 | Attending: Orthopedic Surgery

## 2018-11-23 DIAGNOSIS — M7918 Myalgia, other site: Secondary | ICD-10-CM | POA: Diagnosis not present

## 2018-11-23 DIAGNOSIS — R252 Cramp and spasm: Secondary | ICD-10-CM | POA: Insufficient documentation

## 2018-11-23 DIAGNOSIS — G8929 Other chronic pain: Secondary | ICD-10-CM | POA: Diagnosis not present

## 2018-11-23 DIAGNOSIS — M6281 Muscle weakness (generalized): Secondary | ICD-10-CM | POA: Diagnosis not present

## 2018-11-23 NOTE — Patient Instructions (Signed)
Hamstring stretch 30 sec x 2-3 3x/day

## 2018-11-23 NOTE — Therapy (Signed)
[place], [place], [address] Phone: [phone]   Fax:  [phone]  Physical Therapy Evaluation  Patient Details  Name: Priscilla Underwood MRN: [id number] Date of Birth: 1960-12-22 Referring Provider (PT): Gaynelle Arabian, MD   Encounter Date: 11/23/2018  PT End of Session - 11/23/18 0723    Visit Number  1    Number of Visits  12    Date for PT Re-Evaluation  12/31/18    Authorization Type  [facility]    PT Start Time  0705    PT Stop Time  0745    PT Time Calculation (min)  40 min    Activity Tolerance  Patient tolerated treatment well    Behavior During Therapy  [facility] for tasks assessed/performed       Past Medical History:  Diagnosis Date  . Arthritis    osteo arthritis  . Depression   . Fibromyalgia   . GERD (gastroesophageal reflux disease)   . PONV (postoperative nausea and vomiting)     Past Surgical History:  Procedure Laterality Date  . BUNIONECTOMY  2004  . CARPAL TUNNEL RELEASE Left 06/15/2015   Procedure: LEFT CARPAL TUNNEL RELEASE;  Surgeon: Roseanne Kaufman, MD;  Location: [place];  Service: Orthopedics;  Laterality: Left;  . CARPAL TUNNEL RELEASE Right 08/31/2015   Procedure: RIGHT LIMITED OPEN CARPAL TUNNEL RELEASE;  Surgeon: Roseanne Kaufman, MD;  Location: [place];  Service: Orthopedics;  Laterality: Right;  . LIPOMA EXCISION Right 05/28/2018   Procedure: EXCISION LIPOMA OF SCALP;  Surgeon: Jerrell Belfast, MD;  Location: [place];  Service: ENT;  Laterality: Right;  . SHOULDER SURGERY  2010, 2008  . TARSAL TUNNEL RELEASE Left 2012  . TUBAL LIGATION  1985    There were no vitals filed for this visit.   Subjective Assessment - 11/23/18 0713    Subjective  No injury.  bilateral hamstring insertion tears.   Going on for 2 years.   She has had therapy for back and this did not  help much for ham pain .  She has an exercise program from  back care she will bring in.    No dry needling per MD. .      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     Limitations  Walking;Sitting   squatting, stairs up   How long can you sit comfortably?  As needed at work but uses [employer] at times.   35 min in drive     How long can you stand comfortably?  As needed    How long can you walk comfortably?  Not sure  able to do as needed normal community distances    Diagnostic tests  MRI     Patient Stated Goals  She wants to have hamstring decr pain.     Currently in Pain?  Yes    Pain Score  3     Pain Location  Buttocks    Pain Orientation  Right;Left;Posterior    Pain Descriptors / Indicators  Burning;Tightness;Sharp    Pain Type  Chronic pain    Pain Onset  More than a month ago    Pain Frequency  Constant    Aggravating Factors   bending , squatting ,     Pain Relieving Factors  lye , sit revcliner and ice.          [facility] PT Assessment - 11/23/18  0001      Assessment   Medical Diagnosis  hamstring tear    Referring Provider (PT)  Gaynelle Arabian, MD    Onset Date/Surgical Date  --   2 years ago or more   Next MD Visit  12/2018    Prior Therapy  No      Precautions   Precautions  None      Restrictions   Weight Bearing Restrictions  No      Balance Screen   Has the patient fallen in the past 6 months  No    Has the patient had a decrease in activity level because of a fear of falling?   No    Is the patient reluctant to leave their home because of a fear of falling?   No      Prior Function   Level of Independence  Independent    Vocation  Full time employment    Vocation Requirements  sit for work      Cognition   Overall Cognitive Status  Within Functional Limits for tasks assessed      Observation/Other Assessments   Focus on Therapeutic Outcomes (FOTO)   40% limited      ROM / Strength   AROM / PROM / Strength  AROM;PROM;Strength      AROM   Overall AROM Comments  trunk flex 70  exte 25       AROM Assessment Site  Hip    Right/Left Hip  Right;Left    Right Hip Extension  10    Right Hip Flexion  135    Right Hip External Rotation   70    Right Hip Internal Rotation   35    Right Hip ADduction  20    Left Hip Extension  10    Left Hip Flexion  135    Left Hip External Rotation   70    Left Hip Internal Rotation   35    Left Hip ADduction  20      Strength   Strength Assessment Site  Hip    Right/Left Hip  Right;Left    Right Hip Flexion  5/5    Right Hip Extension  4+/5    Right Hip External Rotation   5/5    Right Hip Internal Rotation  5/5    Right Hip ABduction  4+/5    Right Hip ADduction  5/5    Left Hip Flexion  5/5    Left Hip Extension  4+/5    Left Hip External Rotation  5/5    Left Hip Internal Rotation  5/5    Left Hip ABduction  4+/5    Left Hip ADduction  5/5      Flexibility   Soft Tissue Assessment /Muscle Length  yes    Hamstrings  SLR  RT 68 LT 72       Palpation   SI assessment   sacrum slight rotation LT     Palpation comment  No tenderness RT iscial tuberosity,   tenderness medial LT ischial tuberosity butr not lateral  general sorness bilateral buttocks.       Ambulation/Gait   Gait Comments  WNL                Objective measurements completed on examination: See above findings.              PT Education - 11/23/18 0756    Education Details  POc  Hamstring  stretch, use of tennis ball for STW    Person(s) Educated  Patient    Methods  Explanation;Demonstration;Tactile cues;Verbal cues;Handout    Comprehension  Returned demonstration;Verbalized understanding       PT Short Term Goals - 11/23/18 0801      PT SHORT TERM GOAL #1   Title  he will be independnet with iniital HEP    Time  2    Period  Weeks    Status  New        PT Long Term Goals - 11/23/18 0802      PT LONG TERM GOAL #1   Title  She will report pain decreased so able to sit at work without increased pain    Time  6    Period  Weeks     Status  New      PT LONG TERM GOAL #2   Title  She will be able to walking with no increased buttock pain    Time  6    Period  Weeks    Status  New      PT LONG TERM GOAL #3   Title  Patient will be I w/ HEP to prevent further incr pain in buttock    Time  6    Period  Weeks    Status  New      PT LONG TERM GOAL #4   Title  She will have no tenderness around either ischial tuberosity    Time  6    Period  Weeks    Status  New      PT LONG TERM GOAL #5   Title  She will be able to bend forward with only 1-2 max pain in buttock    Time  6    Period  Weeks    Status  New             Plan - 11/23/18 0723    Clinical Impression Statement  Ms Taber reports pain iun buttock that has been 2+years in duration. She has been seen and treated for 3 months for lower back pain with some benefit but stiull with pain in buttocks .   She had no pain with MMT of hamstrings or other muscles of lleg .  most pain with trunk forward flexion.. She did have some slight decr RT hamstring gs possibly related to OA of that hip.  She has had 2 episodes of care for her back in past year   History and Personal Factors relevant to plan of care:  chronic back pain    Clinical Presentation  Stable    Clinical Decision Making  Low    Rehab Potential  Good    PT Frequency  2x / week    PT Duration  4 weeks    PT Treatment/Interventions  Ultrasound;Moist Heat;Iontophoresis 4mg /ml Dexamethasone;Therapeutic exercise;Patient/family education;Manual techniques;Passive range of motion    PT Next Visit Plan  No dry needling per MD              STW, [place] ( per MD)  stretching  hip strength .   REview HEP from back care    PT Home Exercise Plan  hamstring stretch    Consulted and Agree with Plan of Care  Patient       Patient will benefit from skilled therapeutic intervention in order to improve the following deficits and impairments:  Pain, Impaired flexibility, Decreased range of motion, Decreased activity  tolerance, Decreased  strength  Visit Diagnosis: Chronic buttock pain - Plan: PT plan of care cert/re-cert  Cramp and spasm - Plan: PT plan of care cert/re-cert  Muscle weakness (generalized) - Plan: PT plan of care cert/re-cert     Problem List Patient Active Problem List   Diagnosis Date Noted  . Lipoma of scalp 05/28/2018    Darrel Hoover PT 11/23/2018, 8:22 AM  [facility] [address] [place], [place], [address] Phone: [phone]   Fax:  [phone]  Name: Priscilla Underwood MRN: [id number] Date of Birth: June 17, 1961

## 2018-11-25 ENCOUNTER — Encounter: Payer: 59 | Admitting: Physical Therapy

## 2018-11-30 ENCOUNTER — Ambulatory Visit: Payer: 59

## 2018-11-30 DIAGNOSIS — M7918 Myalgia, other site: Secondary | ICD-10-CM

## 2018-11-30 DIAGNOSIS — M6281 Muscle weakness (generalized): Secondary | ICD-10-CM | POA: Diagnosis not present

## 2018-11-30 DIAGNOSIS — G8929 Other chronic pain: Secondary | ICD-10-CM

## 2018-11-30 DIAGNOSIS — R252 Cramp and spasm: Secondary | ICD-10-CM | POA: Diagnosis not present

## 2018-11-30 MED FILL — ZOLPIDEM TARTRATE 5 MG TAB: 5 | 30 days supply | Qty: 30 | Fill #5

## 2018-11-30 NOTE — Therapy (Addendum)
[place], [place], [address] Phone: [phone]   Fax:  [phone]  Physical Therapy Treatment/Discharge  Patient Details  Name: Priscilla Underwood MRN: [id number] Date of Birth: March 13, 1961 Referring Provider (PT): Gaynelle Arabian, MD   Encounter Date: 11/30/2018  PT End of Session - 11/30/18 0702    Visit Number  2    Number of Visits  12    Date for PT Re-Evaluation  12/31/18    Authorization Type  [facility]    PT Start Time  0702    PT Stop Time  0747    PT Time Calculation (min)  45 min    Activity Tolerance  Patient tolerated treatment well    Behavior During Therapy  [facility] for tasks assessed/performed       Past Medical History:  Diagnosis Date  . Arthritis    osteo arthritis  . Depression   . Fibromyalgia   . GERD (gastroesophageal reflux disease)   . PONV (postoperative nausea and vomiting)     Past Surgical History:  Procedure Laterality Date  . BUNIONECTOMY  2004  . CARPAL TUNNEL RELEASE Left 06/15/2015   Procedure: LEFT CARPAL TUNNEL RELEASE;  Surgeon: Roseanne Kaufman, MD;  Location: [place];  Service: Orthopedics;  Laterality: Left;  . CARPAL TUNNEL RELEASE Right 08/31/2015   Procedure: RIGHT LIMITED OPEN CARPAL TUNNEL RELEASE;  Surgeon: Roseanne Kaufman, MD;  Location: [place];  Service: Orthopedics;  Laterality: Right;  . LIPOMA EXCISION Right 05/28/2018   Procedure: EXCISION LIPOMA OF SCALP;  Surgeon: Jerrell Belfast, MD;  Location: [place];  Service: ENT;  Laterality: Right;  . SHOULDER SURGERY  2010, 2008  . TARSAL TUNNEL RELEASE Left 2012  . TUBAL LIGATION  1985    There were no vitals filed for this visit.  Subjective Assessment - 11/30/18 0703    Subjective  No changes. Forgot HEp    Pain Score  3     Pain Location  --   RT SI   Pain Orientation  Right    Pain Descriptors / Indicators  Burning    Pain Onset  More than a  month ago    Pain Frequency  Constant    Aggravating Factors   bend /squat    Pain Relieving Factors  sit recline, ice                       [facility] Adult PT Treatment/Exercise - 11/30/18 0001      Exercises   Exercises  Lumbar;Knee/Hip      Lumbar Exercises: Stretches   Passive Hamstring Stretch  Right;Left;30 seconds;1 rep    Passive Hamstring Stretch Limitations  with strap  also medial and lateral pull x 1    Piriformis Stretch  Right;Left;1 rep;30 seconds    Figure 4 Stretch  2 reps;30 seconds    Figure 4 Stretch Limitations  RT/L       Knee/Hip Exercises: Prone   Hamstring Curl  15 reps    Hamstring Curl Limitations  RT/LT     Straight Leg Raises  Right;Left;15 reps      Modalities   Modalities  Ultrasound;Iontophoresis      Ultrasound   Ultrasound Location  RT/Lt   iscial tuberosities    Ultrasound Parameters  100$% 1.5 Wcm2  1Mhz    Ultrasound Goals  Pain      Iontophoresis   Type of Iontophoresis  --  Location  --    Dose  --    Time  --             PT Education - 11/30/18 0751    Education Details  act hamstring and hamstring stretch med anmnd lateral along with stretch    Person(s) Educated  Patient    Methods  Explanation;Tactile cues;Verbal cues;Handout    Comprehension  Returned demonstration;Verbalized understanding       PT Short Term Goals - 11/23/18 0801      PT SHORT TERM GOAL #1   Title  he will be independnet with iniital HEP    Time  2    Period  Weeks    Status  New        PT Long Term Goals - 11/23/18 0802      PT LONG TERM GOAL #1   Title  She will report pain decreased so able to sit at work without increased pain    Time  6    Period  Weeks    Status  New      PT LONG TERM GOAL #2   Title  She will be able to walking with no increased buttock pain    Time  6    Period  Weeks    Status  New      PT LONG TERM GOAL #3   Title  Patient will be I w/ HEP to prevent further incr pain in buttock    Time   6    Period  Weeks    Status  New      PT LONG TERM GOAL #4   Title  She will have no tenderness around either ischial tuberosity    Time  6    Period  Weeks    Status  New      PT LONG TERM GOAL #5   Title  She will be able to bend forward with only 1-2 max pain in buttock    Time  6    Period  Weeks    Status  New            Plan - 11/30/18 5462    Clinical Impression Statement  No tenderness ischial tuberosity RT or LT.    Reviewed stretching for haome and added to this.     PT Treatment/Interventions  Ultrasound;Moist Heat;Iontophoresis 20m/ml Dexamethasone;Therapeutic exercise;Patient/family education;Manual techniques;Passive range of motion    PT Next Visit Plan  ham stretch med and lat, ans straight  ham curls    PT Home Exercise Plan  hamstring stretch, ham curls,      Consulted and Agree with Plan of Care  Patient       Patient will benefit from skilled therapeutic intervention in order to improve the following deficits and impairments:  Pain, Impaired flexibility, Decreased range of motion, Decreased activity tolerance, Decreased strength  Visit Diagnosis: Chronic buttock pain  Cramp and spasm  Muscle weakness (generalized)     Problem List Patient Active Problem List   Diagnosis Date Noted  . 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  Patient goals were not met. Patient is being discharged due to                                                      ?????   Going to another facility Pearson Forster PT   01/12/2019

## 2018-11-30 NOTE — Patient Instructions (Signed)
Hamstring stretch strap straight  Medial and lateral 30 sec x 1-2   2x/day , prone hamstring curls RT/LT 10-20  1x/day

## 2018-12-06 ENCOUNTER — Ambulatory Visit: Payer: 59 | Admitting: Physical Therapy

## 2018-12-09 ENCOUNTER — Ambulatory Visit: Payer: 59

## 2018-12-14 ENCOUNTER — Encounter: Payer: 59 | Admitting: Physical Therapy

## 2018-12-15 DIAGNOSIS — M25552 Pain in left hip: Secondary | ICD-10-CM | POA: Diagnosis not present

## 2018-12-15 DIAGNOSIS — M6281 Muscle weakness (generalized): Secondary | ICD-10-CM | POA: Diagnosis not present

## 2018-12-15 DIAGNOSIS — M25551 Pain in right hip: Secondary | ICD-10-CM | POA: Diagnosis not present

## 2018-12-17 ENCOUNTER — Encounter: Payer: 59 | Admitting: Physical Therapy

## 2018-12-20 DIAGNOSIS — E669 Obesity, unspecified: Secondary | ICD-10-CM | POA: Diagnosis not present

## 2018-12-20 DIAGNOSIS — Z6832 Body mass index (BMI) 32.0-32.9, adult: Secondary | ICD-10-CM | POA: Diagnosis not present

## 2018-12-20 DIAGNOSIS — M797 Fibromyalgia: Secondary | ICD-10-CM | POA: Diagnosis not present

## 2018-12-20 DIAGNOSIS — M150 Primary generalized (osteo)arthritis: Secondary | ICD-10-CM | POA: Diagnosis not present

## 2018-12-20 DIAGNOSIS — M2550 Pain in unspecified joint: Secondary | ICD-10-CM | POA: Diagnosis not present

## 2018-12-20 DIAGNOSIS — Z79899 Other long term (current) drug therapy: Secondary | ICD-10-CM | POA: Diagnosis not present

## 2018-12-20 DIAGNOSIS — S76319A Strain of muscle, fascia and tendon of the posterior muscle group at thigh level, unspecified thigh, initial encounter: Secondary | ICD-10-CM | POA: Diagnosis not present

## 2018-12-20 DIAGNOSIS — M5431 Sciatica, right side: Secondary | ICD-10-CM | POA: Diagnosis not present

## 2018-12-20 MED FILL — SERTRALINE HCL 100 MG TAB: 100 | 30 days supply | Qty: 45 | Fill #0

## 2018-12-21 ENCOUNTER — Encounter: Payer: 59 | Admitting: Physical Therapy

## 2018-12-22 DIAGNOSIS — M25552 Pain in left hip: Secondary | ICD-10-CM | POA: Diagnosis not present

## 2018-12-22 DIAGNOSIS — M25551 Pain in right hip: Secondary | ICD-10-CM | POA: Diagnosis not present

## 2018-12-22 DIAGNOSIS — M6281 Muscle weakness (generalized): Secondary | ICD-10-CM | POA: Diagnosis not present

## 2018-12-22 DIAGNOSIS — R2689 Other abnormalities of gait and mobility: Secondary | ICD-10-CM | POA: Diagnosis not present

## 2018-12-24 DIAGNOSIS — M25552 Pain in left hip: Secondary | ICD-10-CM | POA: Diagnosis not present

## 2018-12-24 DIAGNOSIS — R2689 Other abnormalities of gait and mobility: Secondary | ICD-10-CM | POA: Diagnosis not present

## 2018-12-24 DIAGNOSIS — M25551 Pain in right hip: Secondary | ICD-10-CM | POA: Diagnosis not present

## 2018-12-24 DIAGNOSIS — M6281 Muscle weakness (generalized): Secondary | ICD-10-CM | POA: Diagnosis not present

## 2018-12-29 DIAGNOSIS — R2689 Other abnormalities of gait and mobility: Secondary | ICD-10-CM | POA: Diagnosis not present

## 2018-12-29 DIAGNOSIS — M25552 Pain in left hip: Secondary | ICD-10-CM | POA: Diagnosis not present

## 2018-12-29 DIAGNOSIS — M25551 Pain in right hip: Secondary | ICD-10-CM | POA: Diagnosis not present

## 2018-12-29 DIAGNOSIS — M6281 Muscle weakness (generalized): Secondary | ICD-10-CM | POA: Diagnosis not present

## 2018-12-29 MED FILL — ZOLPIDEM TARTRATE 5 MG TAB: 5 | 30 days supply | Qty: 30 | Fill #0

## 2018-12-30 DIAGNOSIS — M25551 Pain in right hip: Secondary | ICD-10-CM | POA: Diagnosis not present

## 2018-12-30 DIAGNOSIS — S76319D Strain of muscle, fascia and tendon of the posterior muscle group at thigh level, unspecified thigh, subsequent encounter: Secondary | ICD-10-CM | POA: Diagnosis not present

## 2018-12-30 DIAGNOSIS — M25552 Pain in left hip: Secondary | ICD-10-CM | POA: Diagnosis not present

## 2019-01-12 MED FILL — DICLOFENAC SODIUM 75 MG TAB: 75 | 30 days supply | Qty: 60 | Fill #4

## 2019-01-13 MED FILL — PANTOPRAZOLE SOD DR 40 MG T: 40 | 30 days supply | Qty: 60 | Fill #0

## 2019-01-17 MED FILL — SERTRALINE HCL 100 MG TAB: 100 | 30 days supply | Qty: 45 | Fill #1

## 2019-01-27 MED FILL — ZOLPIDEM TARTRATE 5 MG TAB: 5 | 30 days supply | Qty: 30 | Fill #1

## 2019-02-04 DIAGNOSIS — Z136 Encounter for screening for cardiovascular disorders: Secondary | ICD-10-CM | POA: Diagnosis not present

## 2019-02-04 DIAGNOSIS — R5383 Other fatigue: Secondary | ICD-10-CM | POA: Diagnosis not present

## 2019-02-18 MED FILL — SERTRALINE HCL 100 MG TAB: 100 | 30 days supply | Qty: 45 | Fill #2

## 2019-02-22 DIAGNOSIS — L0291 Cutaneous abscess, unspecified: Secondary | ICD-10-CM | POA: Diagnosis not present

## 2019-02-22 MED FILL — SULFAMETHOXAZOLE-TMP DS TAB: 800-160 | 10 days supply | Qty: 20 | Fill #0

## 2019-02-22 MED FILL — traMADol HCL 50 MG TABS: 50 | 30 days supply | Qty: 60 | Fill #0

## 2019-02-28 MED FILL — ZOLPIDEM TARTRATE 5 MG TAB: 5 | 30 days supply | Qty: 30 | Fill #2

## 2019-03-07 MED FILL — SERTRALINE HCL 100 MG TAB: 100 | 30 days supply | Qty: 45 | Fill #3

## 2019-03-07 MED FILL — DICLOFENAC SODIUM 75 MG TAB: 75 | 30 days supply | Qty: 60 | Fill #5

## 2019-03-07 MED FILL — PANTOPRAZOLE SOD DR 40 MG T: 40 | 30 days supply | Qty: 60 | Fill #1

## 2019-03-07 MED FILL — METHOCARBAMOL 500 MG TABLET: 500 | 30 days supply | Qty: 90 | Fill #0

## 2019-03-31 MED FILL — DICLOFENAC SODIUM 75 MG TAB: 75 | 30 days supply | Qty: 60 | Fill #0

## 2019-03-31 MED FILL — PANTOPRAZOLE SOD DR 40 MG T: 40 | 30 days supply | Qty: 60 | Fill #2

## 2019-04-01 MED FILL — ZOLPIDEM TARTRATE 5 MG TAB: 5 | 30 days supply | Qty: 30 | Fill #3

## 2019-04-04 MED FILL — METHOCARBAMOL 500 MG TABLET: 500 | 30 days supply | Qty: 90 | Fill #0

## 2019-04-05 DIAGNOSIS — R12 Heartburn: Secondary | ICD-10-CM | POA: Diagnosis not present

## 2019-04-05 MED FILL — SUCRALFATE 1 GM TABLET: 1 | 30 days supply | Qty: 120 | Fill #0

## 2019-04-06 MED FILL — SERTRALINE HCL 100 MG TAB: 100 | 30 days supply | Qty: 46 | Fill #0

## 2019-04-12 DIAGNOSIS — R1013 Epigastric pain: Secondary | ICD-10-CM | POA: Diagnosis not present

## 2019-04-13 ENCOUNTER — Other Ambulatory Visit: Payer: Self-pay | Admitting: Family Medicine

## 2019-04-13 DIAGNOSIS — R1013 Epigastric pain: Secondary | ICD-10-CM

## 2019-04-18 ENCOUNTER — Other Ambulatory Visit: Payer: Self-pay

## 2019-04-18 ENCOUNTER — Emergency Department (HOSPITAL_COMMUNITY): Payer: 59

## 2019-04-18 ENCOUNTER — Encounter (HOSPITAL_COMMUNITY): Payer: Self-pay

## 2019-04-18 ENCOUNTER — Emergency Department (HOSPITAL_COMMUNITY)
Admission: EM | Admit: 2019-04-18 | Discharge: 2019-04-19 | Disposition: A | Discharge status: Home / Self Care | Payer: 59 | Attending: Emergency Medicine | Admitting: Emergency Medicine

## 2019-04-18 DIAGNOSIS — R0789 Other chest pain: Secondary | ICD-10-CM | POA: Insufficient documentation

## 2019-04-18 DIAGNOSIS — Z87891 Personal history of nicotine dependence: Secondary | ICD-10-CM | POA: Diagnosis not present

## 2019-04-18 DIAGNOSIS — K219 Gastro-esophageal reflux disease without esophagitis: Secondary | ICD-10-CM | POA: Diagnosis not present

## 2019-04-18 DIAGNOSIS — J9811 Atelectasis: Secondary | ICD-10-CM | POA: Diagnosis not present

## 2019-04-18 DIAGNOSIS — Z79899 Other long term (current) drug therapy: Secondary | ICD-10-CM | POA: Insufficient documentation

## 2019-04-18 DIAGNOSIS — K210 Gastro-esophageal reflux disease with esophagitis: Secondary | ICD-10-CM | POA: Diagnosis not present

## 2019-04-18 LAB — CBC
HCT: 39.7 % (ref 36.0–46.0)
Hemoglobin: 13 g/dL (ref 12.0–15.0)
MCH: 31 pg (ref 26.0–34.0)
MCHC: 32.7 g/dL (ref 30.0–36.0)
MCV: 94.7 fL (ref 80.0–100.0)
Platelets: 220 10*3/uL (ref 150–400)
RBC: 4.19 MIL/uL (ref 3.87–5.11)
RDW: 13.7 % (ref 11.5–15.5)
WBC: 7.5 10*3/uL (ref 4.0–10.5)
nRBC: 0 % (ref 0.0–0.2)

## 2019-04-18 LAB — BASIC METABOLIC PANEL
Anion gap: 13 (ref 5–15)
BUN: 11 mg/dL (ref 6–20)
CO2: 21 mmol/L — ABNORMAL LOW (ref 22–32)
Calcium: 9.3 mg/dL (ref 8.9–10.3)
Chloride: 107 mmol/L (ref 98–111)
Creatinine, Ser: 1.01 mg/dL — ABNORMAL HIGH (ref 0.44–1.00)
GFR calc Af Amer: >60 mL/min (ref >60)
GFR calc non Af Amer: >60 mL/min (ref >60)
Glucose, Bld: 115 mg/dL — ABNORMAL HIGH (ref 70–99)
Potassium: 3.6 mmol/L (ref 3.5–5.1)
Sodium: 141 mmol/L (ref 135–145)

## 2019-04-18 LAB — I-STAT BETA HCG BLOOD, ED (MC, WL, AP ONLY): I-stat hCG, quantitative: <5 m[IU]/mL (ref <5)

## 2019-04-18 LAB — TROPONIN I: Troponin I: <0.03 ng/mL (ref <0.03)

## 2019-04-18 MED ORDER — SODIUM CHLORIDE 0.9% FLUSH: 3.0000 mL | Freq: Once | INTRAVENOUS | Status: DC

## 2019-04-18 NOTE — ED Notes (Signed)
Pt's sps. Darryl Braley [phone]. pls contact with updates

## 2019-04-18 NOTE — ED Triage Notes (Signed)
Pt reports heaviness in her chest for about 2 weeks, seen at pcp for same, workup for a ulcer but states the pain is worse now. Ct ordered for Friday to rule out aortic aneurysm due to family hx. Having a lot of belching, intermittent pain between shoulder blades. Pt a.o, nad noted

## 2019-04-19 DIAGNOSIS — Z79899 Other long term (current) drug therapy: Secondary | ICD-10-CM | POA: Diagnosis not present

## 2019-04-19 DIAGNOSIS — Z87891 Personal history of nicotine dependence: Secondary | ICD-10-CM | POA: Diagnosis not present

## 2019-04-19 DIAGNOSIS — R0789 Other chest pain: Secondary | ICD-10-CM | POA: Diagnosis not present

## 2019-04-19 DIAGNOSIS — K219 Gastro-esophageal reflux disease without esophagitis: Secondary | ICD-10-CM | POA: Diagnosis not present

## 2019-04-19 LAB — HEPATIC FUNCTION PANEL
ALT: 27 U/L (ref 0–44)
AST: 26 U/L (ref 15–41)
Albumin: 4 g/dL (ref 3.5–5.0)
Alkaline Phosphatase: 90 U/L (ref 38–126)
Bilirubin, Direct: <0.1 mg/dL (ref 0.0–0.2)
Total Bilirubin: 0.4 mg/dL (ref 0.3–1.2)
Total Protein: 6.8 g/dL (ref 6.5–8.1)

## 2019-04-19 LAB — LIPASE, BLOOD: Lipase: 39 U/L (ref 11–51)

## 2019-04-19 MED ORDER — LIDOCAINE VISCOUS HCL 2 % MT SOLN
15.0000 mL | Freq: Once | OROMUCOSAL | Status: AC
  Administered 2019-04-19: 15 mL via ORAL
  Filled 2019-04-19: qty 15

## 2019-04-19 MED ORDER — ALUM & MAG HYDROXIDE-SIMETH 200-200-20 MG/5ML PO SUSP
30.0000 mL | Freq: Once | ORAL | Status: AC
  Administered 2019-04-19: 02:00:00 30 mL via ORAL
  Filled 2019-04-19: qty 30

## 2019-04-19 MED ORDER — DICYCLOMINE HCL 10 MG/5ML PO SOLN
10.0000 mg | Freq: Once | ORAL | Status: AC
  Administered 2019-04-19: 10 mg via ORAL
  Filled 2019-04-19: qty 5

## 2019-04-19 MED ORDER — DICYCLOMINE HCL 20 MG PO TABS: 20.0000 mg | ORAL_TABLET | Freq: Two times a day (BID) | ORAL | 0 refills | Status: DC

## 2019-04-19 MED FILL — valACYclovir HCL 1 GM TABS: 1 | 5 days supply | Qty: 10 | Fill #0

## 2019-04-19 NOTE — Discharge Instructions (Addendum)
You were seen today for chest pain.  This is likely related to your GI tract.  Start taking Bentyl in addition to Pepcid and Carafate.  Follow-up with gastroenterology.

## 2019-04-19 NOTE — ED Provider Notes (Signed)
[facility] EMERGENCY DEPARTMENT Provider Note   CSN: [id number] Arrival date & time: 04/18/19  2121    History   Chief Complaint Chief Complaint  Patient presents with  . Chest Pain    HPI Priscilla Underwood is a 58 y.o. female.     HPI  This is a 58 year old female with a history of reflux, fibromyalgia who presents with chest pain.  Patient reports 2 to 3-week history of chest pain.  She describes the pain as pressure and heaviness.  It sometimes radiates to her back.  She reports a lot of belching and nausea.  Currently her pain is 7 out of 10.  She saw her primary physician with a reportedly negative EKG.  She treated her with Carafate and Pepcid for a possible ulcer.  These have not helped.  Pain is not worse with ambulation or food.  She has a family history of aortic aneurysm and is due to have a CT scan later this week.  She denies any ripping or tearing nature to the pain.  She denies any fevers, cough, shortness of breath.  Past Medical History:  Diagnosis Date  . Arthritis    osteo arthritis  . Depression   . Fibromyalgia   . GERD (gastroesophageal reflux disease)   . PONV (postoperative nausea and vomiting)     Patient Active Problem List   Diagnosis Date Noted  . 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 BUNIONECTOMY  2004  . CARPAL TUNNEL RELEASE Left 06/15/2015   Procedure: LEFT CARPAL TUNNEL RELEASE;  Surgeon: Roseanne Kaufman, MD;  Location: [place];  Service: Orthopedics;  Laterality: Left;  . CARPAL TUNNEL RELEASE Right 08/31/2015   Procedure: RIGHT LIMITED OPEN CARPAL TUNNEL RELEASE;  Surgeon: Roseanne Kaufman, MD;  Location: [place];  Service: Orthopedics;  Laterality: Right;  . LIPOMA EXCISION Right 05/28/2018   Procedure: EXCISION LIPOMA OF SCALP;  Surgeon: Jerrell Belfast, MD;  Location: [place];  Service: ENT;  Laterality: Right;  .  SHOULDER SURGERY  2010, 2008  . TARSAL TUNNEL RELEASE Left 2012  . TUBAL LIGATION  1985     OB History   No obstetric history on file.      Home Medications    Prior to Admission medications   Medication Sig Start Date End Date Taking? Authorizing Provider  cephALEXin (KEFLEX) 500 MG capsule Take 1 capsule (500 mg total) by mouth 3 (three) times daily. Patient not taking: Reported on 11/23/2018 05/28/18   Jerrell Belfast, MD  diclofenac (VOLTAREN) 75 MG EC tablet Take 75 mg by mouth 2 (two) times daily. Reported on 05/07/2016    [provider]  dicyclomine (BENTYL) 20 MG tablet Take 1 tablet (20 mg total) by mouth 2 (two) times daily. 04/19/19   Horton, Barbette Hair, MD  methocarbamol (ROBAXIN) 500 MG tablet Take 500 mg by mouth every 8 (eight) hours as needed for muscle spasms.    [provider]  pantoprazole (PROTONIX) 40 MG tablet Take 40 mg by mouth daily. 02/01/16   [provider]  sertraline (ZOLOFT) 100 MG tablet  01/01/17   [provider]  sertraline (ZOLOFT) 50 MG tablet Take 50 mg by mouth daily.    [provider]  valACYclovir (VALTREX) 1000 MG tablet  01/20/17   [provider]  zolpidem (AMBIEN) 5 MG tablet  01/01/17   [provider]  Family History Family History  Problem Relation Age of Onset  . Breast cancer Sister     Social History Social History   Tobacco Use  . Smoking status: Former Smoker    Types: Cigarettes  . Smokeless tobacco: Never Used  . Tobacco comment: Quit 35 years ago  Substance Use Topics  . Alcohol use: No  . Drug use: No     Allergies   Ciprofloxacin and Avelox [moxifloxacin hcl in nacl]   Review of Systems Review of Systems  Constitutional: Negative for fever.  Respiratory: Negative for cough and shortness of breath.   Cardiovascular: Positive for chest pain.  Gastrointestinal: Positive for nausea. Negative for abdominal pain, diarrhea and vomiting.        Belching  Genitourinary: Negative for dysuria.  Musculoskeletal: Negative for back pain.  Neurological: Negative for weakness.  All other systems reviewed and are negative.    Physical Exam Updated Vital Signs BP 116/77 (BP Location: Right Arm)   Pulse (!) 47   Temp 98.7 F (37.1 C) (Oral)   Resp 16   SpO2 99%   Physical Exam Vitals signs and nursing note reviewed.  Constitutional:      Appearance: She is well-developed.     Comments: Overweight, nontoxic-appearing  HENT:     Head: Normocephalic and atraumatic.  Eyes:     Pupils: Pupils are equal, round, and reactive to light.  Neck:     Musculoskeletal: Neck supple.  Cardiovascular:     Rate and Rhythm: Normal rate and regular rhythm.     Heart sounds: Normal heart sounds.  Pulmonary:     Effort: Pulmonary effort is normal. No respiratory distress.     Breath sounds: No wheezing.  Abdominal:     General: Bowel sounds are normal.     Palpations: Abdomen is soft. There is no mass.  Musculoskeletal:     Right lower leg: No edema.     Left lower leg: No edema.  Skin:    General: Skin is warm and dry.  Neurological:     Mental Status: She is alert and oriented to person, place, and time.  Psychiatric:        Mood and Affect: Mood normal.      ED Treatments / Results  Labs (all labs ordered are listed, but only abnormal results are displayed) Labs Reviewed  BASIC METABOLIC PANEL - Abnormal; Notable for the following components:      Result Value   CO2 21 (*)    Glucose, Bld 115 (*)    Creatinine, Ser 1.01 (*)    All other components within normal limits  CBC  TROPONIN I  HEPATIC FUNCTION PANEL  LIPASE, BLOOD  I-STAT BETA HCG BLOOD, ED ([facility], [facility], [facility] ONLY)    EKG EKG Interpretation  Date/Time:  Monday Apr 18 2019 21:28:18 EDT Ventricular Rate:  69 PR Interval:  138 QRS Duration: 80 QT Interval:  376 QTC Calculation: 402 R Axis:   47 Text Interpretation:  Normal sinus rhythm Nonspecific ST  abnormality Abnormal ECG Confirmed by Thayer Jew [phone]) on 04/19/2019 12:58:48 AM   Radiology Dg Chest 2 View  Result Date: 04/18/2019 CLINICAL DATA:  Chest tension EXAM: CHEST - 2 VIEW COMPARISON:  04/12/2019 FINDINGS: The heart size and mediastinal contours are within normal limits. Mild left basilar atelectasis. No other focal airspace abnormality. The visualized skeletal structures are unremarkable. IMPRESSION: No active cardiopulmonary disease. Electronically Signed   By: Ulyses Jarred M.D.   On: 04/18/2019  22:09    Procedures Procedures (including critical care time)  Medications Ordered in ED Medications  sodium chloride flush (NS) 0.9 % injection 3 mL (has no administration in time range)  alum & mag hydroxide-simeth (MAALOX/MYLANTA) 200-200-20 MG/5ML suspension 30 mL (30 mLs Oral Given 04/19/19 0158)    And  lidocaine (XYLOCAINE) 2 % viscous mouth solution 15 mL (15 mLs Oral Given 04/19/19 0158)  dicyclomine (BENTYL) 10 MG/5ML syrup 10 mg (10 mg Oral Given 04/19/19 0158)     Initial Impression / Assessment and Plan / ED Course  I have reviewed the triage vital signs and the nursing notes.  Pertinent labs & imaging results that were available during my care of the patient were reviewed by me and considered in my medical decision making (see chart for details).        Patient presents with ongoing chest pain.  Very atypical in nature.  It has been constant.  She is overall nontoxic-appearing vital signs are reassuring.  EKG shows no signs of ischemia.  Chest x-ray without pneumothorax or pneumonia.  History is highly suggestive of GI source including peptic ulcer or gastritis.  Given benign abdominal exams, feel that gallbladder pathology or pancreatitis is less likely.  LFTs and lipase are reassuring.  Patient was given Bentyl, Maalox, and viscous lidocaine.  On recheck, she states that these improved her symptoms.  She likely needs an EGD.  We will add Bentyl to her GI  regimen.  Low suspicion for ACS at this time.  Encouraged her to get her outpatient CT as planned.  I have low suspicion for aortic pathology at this time as well.  After history, exam, and medical workup I feel the patient has been appropriately medically screened and is safe for discharge home. Pertinent diagnoses were discussed with the patient. Patient was given return precautions.   Final Clinical Impressions(s) / ED Diagnoses   Final diagnoses:  Atypical chest pain  Gastroesophageal reflux disease, esophagitis presence not specified    ED Discharge Orders         Ordered    dicyclomine (BENTYL) 20 MG tablet  2 times daily     04/19/19 0250           Horton, Barbette Hair, MD 04/19/19 [phone]

## 2019-04-19 NOTE — ED Notes (Signed)
Pt discharged from ED; instructions provided and scripts given; Pt encouraged to return to ED if symptoms worsen and to f/u with PCP; Pt verbalized understanding of all instructions 

## 2019-04-20 MED FILL — DICYCLOMINE 20 MG TABLET: 20 | 15 days supply | Qty: 30 | Fill #0

## 2019-04-22 ENCOUNTER — Ambulatory Visit
Admission: RE | Admit: 2019-04-22 | Discharge: 2019-04-22 | Disposition: A | Discharge status: Home / Self Care | Payer: 59 | Source: Ambulatory Visit | Attending: Family Medicine | Admitting: Family Medicine

## 2019-04-22 DIAGNOSIS — R1013 Epigastric pain: Secondary | ICD-10-CM | POA: Diagnosis not present

## 2019-04-25 MED FILL — DICLOFENAC SODIUM 75 MG TAB: 75 | 30 days supply | Qty: 60 | Fill #1

## 2019-04-28 DIAGNOSIS — Z01419 Encounter for gynecological examination (general) (routine) without abnormal findings: Secondary | ICD-10-CM | POA: Diagnosis not present

## 2019-04-28 DIAGNOSIS — Z124 Encounter for screening for malignant neoplasm of cervix: Secondary | ICD-10-CM | POA: Diagnosis not present

## 2019-04-28 MED FILL — METHOCARBAMOL 500 MG TABLET: 500 | 30 days supply | Qty: 90 | Fill #0

## 2019-04-30 MED FILL — SUCRALFATE 1 GM TABLET: 1 | 30 days supply | Qty: 120 | Fill #0

## 2019-04-30 MED FILL — PANTOPRAZOLE SOD DR 40 MG T: 40 | 30 days supply | Qty: 60 | Fill #3

## 2019-04-30 MED FILL — SERTRALINE HCL 100 MG TAB: 100 | 30 days supply | Qty: 45 | Fill #0

## 2019-05-04 MED FILL — ZOLPIDEM TARTRATE 5 MG TAB: 5 | 30 days supply | Qty: 30 | Fill #0

## 2019-05-19 ENCOUNTER — Other Ambulatory Visit: Payer: Self-pay | Admitting: Family Medicine

## 2019-05-19 DIAGNOSIS — Z1231 Encounter for screening mammogram for malignant neoplasm of breast: Secondary | ICD-10-CM

## 2019-05-19 DIAGNOSIS — F329 Major depressive disorder, single episode, unspecified: Secondary | ICD-10-CM | POA: Diagnosis not present

## 2019-05-19 DIAGNOSIS — Z0000 Encounter for general adult medical examination without abnormal findings: Secondary | ICD-10-CM | POA: Diagnosis not present

## 2019-05-19 DIAGNOSIS — K219 Gastro-esophageal reflux disease without esophagitis: Secondary | ICD-10-CM | POA: Diagnosis not present

## 2019-05-23 MED FILL — METHOCARBAMOL 500 MG TABLET: 500 | 30 days supply | Qty: 90 | Fill #1

## 2019-05-24 MED FILL — DICLOFENAC SODIUM 75 MG TAB: 75 | 30 days supply | Qty: 60 | Fill #2

## 2019-05-24 MED FILL — SERTRALINE HCL 100 MG TAB: 100 | 30 days supply | Qty: 45 | Fill #0

## 2019-05-24 MED FILL — SUCRALFATE 1 GM TABLET: 1 | 30 days supply | Qty: 120 | Fill #0

## 2019-05-26 ENCOUNTER — Encounter: Payer: Self-pay | Admitting: Gastroenterology

## 2019-05-26 ENCOUNTER — Ambulatory Visit (INDEPENDENT_AMBULATORY_CARE_PROVIDER_SITE_OTHER): Payer: 59 | Admitting: Gastroenterology

## 2019-05-26 ENCOUNTER — Telehealth: Payer: Self-pay | Admitting: Emergency Medicine

## 2019-05-26 ENCOUNTER — Other Ambulatory Visit: Payer: Self-pay

## 2019-05-26 VITALS — Ht 65.0 in | Wt 190.0 lb

## 2019-05-26 DIAGNOSIS — R0789 Other chest pain: Secondary | ICD-10-CM

## 2019-05-26 DIAGNOSIS — R142 Eructation: Secondary | ICD-10-CM | POA: Diagnosis not present

## 2019-05-26 DIAGNOSIS — K219 Gastro-esophageal reflux disease without esophagitis: Secondary | ICD-10-CM | POA: Diagnosis not present

## 2019-05-26 NOTE — Progress Notes (Signed)
TELEHEALTH VISIT  Referring Provider: Lyman Bishop, DO Primary Care Physician:  Lyman Bishop, DO   Tele-visit due to COVID-19 pandemic Patient requested visit virtually, consented to the virtual encounter via video enabled telemedicine application (Zoom) Contact made at: 08:25 05/26/19 Patient verified by name and date of birth Location of patient: Home Location provider:  medical office Names of persons participating: Me, patient, [name] CMA Time spent on telehealth visit: 26 minutes I discussed the limitations of evaluation and management by telemedicine. The patient expressed understanding and agreed to proceed.  Reason for Consultation:  GERD, belching   IMPRESSION:  Eructations Recent atypical chest pain, now resolved GERD controlled on PPI Chronic NSAIDs for fibromyalgia (Diclofenac) Echogenic liver on ultrasound    - ALT 27, AST 26    - attempting weight loss   Recent abdominal ultrasound was normal.   Differential: GERD, NERD, PUD, functional dyspepsia, supragastric belching, SIBO  Continue PPI is recommended for management of possible reflux. EGD recommended given her frequent NSAIDs. If no response to empiric treatment, will consider 24 hour pH/impedence testing to evaluate for supragastric belching and air swallowing.   Reviewed echogenic liver on ultrasound and possible diagnosis of fatty liver.   PLAN: Obtain office notes, colonoscopy and EGD and path results from Dr. Liane Comber in [facility] Work to maintain a healthy weight Continue pantoprazole 40 mg daily Avoid caffeine, carbonation, sugar substitutes, and dairy EGD with biopsies  HPI: Priscilla Underwood is a 58 y.o. female self-referred for GERD. She is on a pantoprazole given her chronic diclofenac for fibromyalgia.  Belching for 1-2 years that worsened in May. No significant heart burn. No significant malodor. Associated chest heaviness and globus. Normal ECG with PCP. She  was treated with Carafate and Pepcid BID without complete relief of symptoms. Ended up in the ER 04/19/19 for chest pain. Labs including liver enzymes and lipase were normal. ED MD did not think it was cardiac. Discharged from ED with Bentyl.  Chest discomfort and globus have resolved. Continues to have belching. Starts as soon as she has her morning coffee. No other NSAIDs except for ASA for headaches. No dysphagia, odynophagia, dysphonia, sore throat. No distension, flatus, or change in bowel habits. No blood or mucous in the stool. Good appetite. Weight stable.  PCP told her that she was concerned about ulcer. The patient is concerned about possible cancer. She would find great relief in her symptoms.   Abdominal ultrasound for epigastric pain 04/22/19 showed an echogenic liver and was otherwise normal.   EGD from 02/12/2017 performed in [facility] when she was having upper abdominal pain showed a small hiatal hernia, gastric erosions, biopsies negative for H pylori and was otherwise normal.   She has had several colonoscopies in [place] with Dr. Gracelyn Nurse.    Family history of aortic aneurysm. Father with peptic ulcer disease. Paternal grandmother with pancreatic cancer died at age 50. No other known family history of colon cancer or polyps. No family history of uterine/endometrial cancer, pancreatic cancer or gastric/stomach cancer.  Past Medical History:  Diagnosis Date   Arthritis    osteo arthritis   Depression    Fibromyalgia    GERD (gastroesophageal reflux disease)    PONV (postoperative nausea and vomiting)     Past Surgical History:  Procedure Laterality Date   BUNIONECTOMY  2004   CARPAL TUNNEL RELEASE Left 06/15/2015   Procedure: LEFT CARPAL TUNNEL RELEASE;  Surgeon: Roseanne Kaufman, MD;  Location: [facility];  Service: Orthopedics;  Laterality: Left;   CARPAL TUNNEL RELEASE Right 08/31/2015   Procedure: RIGHT LIMITED OPEN CARPAL TUNNEL RELEASE;   Surgeon: Roseanne Kaufman, MD;  Location: [place];  Service: Orthopedics;  Laterality: Right;   LIPOMA EXCISION Right 05/28/2018   Procedure: EXCISION LIPOMA OF SCALP;  Surgeon: Jerrell Belfast, MD;  Location: [place];  Service: ENT;  Laterality: Right;   SHOULDER SURGERY  2010, 2008   TARSAL TUNNEL RELEASE Left 2012   TUBAL LIGATION  1985    Current Outpatient Medications  Medication Sig Dispense Refill   cephALEXin (KEFLEX) 500 MG capsule Take 1 capsule (500 mg total) by mouth 3 (three) times daily. (Patient not taking: Reported on 11/23/2018) 30 capsule 1   diclofenac (VOLTAREN) 75 MG EC tablet Take 75 mg by mouth 2 (two) times daily. Reported on 05/07/2016     dicyclomine (BENTYL) 20 MG tablet Take 1 tablet (20 mg total) by mouth 2 (two) times daily. 30 tablet 0   methocarbamol (ROBAXIN) 500 MG tablet Take 500 mg by mouth every 8 (eight) hours as needed for muscle spasms.     pantoprazole (PROTONIX) 40 MG tablet Take 40 mg by mouth daily.     sertraline (ZOLOFT) 100 MG tablet   5   sertraline (ZOLOFT) 50 MG tablet Take 50 mg by mouth daily.     valACYclovir (VALTREX) 1000 MG tablet   2   zolpidem (AMBIEN) 5 MG tablet   1   No current facility-administered medications for this visit.     Allergies as of 05/26/2019 - Review Complete 04/18/2019  Allergen Reaction Noted   Ciprofloxacin Rash 04/14/2014   Avelox [moxifloxacin hcl in nacl] Rash 08/27/2015    Family History  Problem Relation Age of Onset   Breast cancer Sister     Social History   Socioeconomic History   Marital status: Married    Spouse name: Not on file   Number of children: Not on file   Years of education: Not on file   Highest education level: Not on file  Occupational History   Not on file  Social Needs   Financial resource strain: Not on file   Food insecurity    Worry: Not on file    Inability: Not on file   Transportation needs    Medical:  Not on file    Non-medical: Not on file  Tobacco Use   Smoking status: Former Smoker    Types: Cigarettes   Smokeless tobacco: Never Used   Tobacco comment: Quit 35 years ago  Substance and Sexual Activity   Alcohol use: No   Drug use: No   Sexual activity: Yes    Birth control/protection: Post-menopausal  Lifestyle   Physical activity    Days per week: Not on file    Minutes per session: Not on file   Stress: Not on file  Relationships   Social connections    Talks on phone: Not on file    Gets together: Not on file    Attends religious service: Not on file    Active member of club or organization: Not on file    Attends meetings of clubs or organizations: Not on file    Relationship status: Not on file   Intimate partner violence    Fear of current or ex partner: Not on file    Emotionally abused: Not on file    Physically abused: Not on file    Forced  sexual activity: Not on file  Other Topics Concern   Not on file  Social History Narrative   Not on file    Review of Systems: ALL ROS discussed and all others negative except listed in HPI.  Physical Exam: Complete physical exam not performed due to the limits inherent in a telehealth encounter.  General: Awake, alert, and oriented, and well communicative. In no acute distress.  HEENT: EOMI, non-icteric sclera, NCAT, MMM  Neck: Normal movement of head and neck  Pulm: No labored breathing, speaking in full sentences without conversational dyspnea  Derm: No apparent lesions or bruising in visible field  MS: Moves all visible extremities without noticeable abnormality  Psych: Pleasant, cooperative, normal speech, normal affect and normal insight Neuro: Alert and appropriate   Robbert Langlinais L. Tarri Glenn, MD, MPH [facility] 05/26/2019, 8:16 AM

## 2019-05-26 NOTE — Telephone Encounter (Signed)
Covid-19 screening questions   Do you now or have you had a fever in the last 14 days? No  Do you have any respiratory symptoms of shortness of breath or cough now or in the last 14 days? No  Do you have any family members or close contacts with diagnosed or suspected Covid-19 in the past 14 days? NO  Have you been tested for Covid-19 and found to be positive? No

## 2019-05-26 NOTE — Patient Instructions (Signed)
I recommend that you avoid caffeine, carbonation, sugar substitutes, and dairy to try to minimize her symptoms.   I have not changed your medications today.  I have recommended an EGD to further evaluation your recent symptoms.  Please call with any questions or concerns prior to that time.   Thank you for your patience with me and our technology today! Please stay home, safe, and healthy. I look forward to meeting you in person in the future.

## 2019-05-27 ENCOUNTER — Ambulatory Visit (AMBULATORY_SURGERY_CENTER): Payer: 59 | Admitting: Gastroenterology

## 2019-05-27 ENCOUNTER — Other Ambulatory Visit: Payer: Self-pay

## 2019-05-27 ENCOUNTER — Encounter: Payer: Self-pay | Admitting: Gastroenterology

## 2019-05-27 VITALS — BP 128/84 | HR 50 | Temp 98.3°F | Resp 16 | Ht 65.0 in | Wt 190.0 lb

## 2019-05-27 DIAGNOSIS — K449 Diaphragmatic hernia without obstruction or gangrene: Secondary | ICD-10-CM | POA: Diagnosis not present

## 2019-05-27 DIAGNOSIS — K219 Gastro-esophageal reflux disease without esophagitis: Secondary | ICD-10-CM | POA: Diagnosis not present

## 2019-05-27 DIAGNOSIS — K3189 Other diseases of stomach and duodenum: Secondary | ICD-10-CM

## 2019-05-27 DIAGNOSIS — K2970 Gastritis, unspecified, without bleeding: Secondary | ICD-10-CM

## 2019-05-27 DIAGNOSIS — K210 Gastro-esophageal reflux disease with esophagitis: Secondary | ICD-10-CM | POA: Diagnosis not present

## 2019-05-27 DIAGNOSIS — K2950 Unspecified chronic gastritis without bleeding: Secondary | ICD-10-CM | POA: Diagnosis not present

## 2019-05-27 MED ORDER — SODIUM CHLORIDE 0.9 % IV SOLN: 500.0000 mL | Freq: Once | INTRAVENOUS | Status: DC

## 2019-05-27 NOTE — Op Note (Addendum)
[facility] Patient Name: Priscilla Underwood Procedure Date: 05/27/2019 10:16 AM MRN: [id number] Endoscopist: Thornton Park MD, MD Age: 58 Referring MD:  Date of Birth: [birth year] Gender: Female Account #: [id number] Procedure:                Upper GI endoscopy Indications:              Eructation                           Recent atypical chest pain, now resolved                           GERD controlled on PPI                           Chronic NSAIDs for fibromyalgia (Diclofenac)                           Echogenic liver on ultrasound                           - ALT 27, AST 26                           - attempting weight loss Eructations                           Recent atypical chest pain, now resolved                           GERD controlled on PPI                           Chronic NSAIDs for fibromyalgia (Diclofenac) Medicines:                See the Anesthesia note for documentation of the                            administered medications Procedure:                Pre-Anesthesia Assessment:                           - Prior to the procedure, a History and Physical                            was performed, and patient medications and                            allergies were reviewed. The patient's tolerance of                            previous anesthesia was also reviewed. The risks                            and benefits of the procedure and the sedation  options and risks were discussed with the patient.                            All questions were answered, and informed consent                            was obtained. Prior Anticoagulants: The patient has                            taken no previous anticoagulant or antiplatelet                            agents. ASA Grade Assessment: II - A patient with                            mild systemic disease. After reviewing the risks                            and benefits, the patient  was deemed in                            satisfactory condition to undergo the procedure.                           After obtaining informed consent, the endoscope was                            passed under direct vision. Throughout the                            procedure, the patient's blood pressure, pulse, and                            oxygen saturations were monitored continuously. The                            Endoscope was introduced through the mouth, and                            advanced to the third part of duodenum. The upper                            GI endoscopy was accomplished without difficulty.                            The patient tolerated the procedure well. Scope In: Scope Out: Findings:                 The esophagus and gastroesophageal junction were                            examined with white light. There were esophageal  mucosal changes suspicious for short-segment                            Barrett's esophagus with a single, isolated island.                            The z-line otherwise appeared normal. Squamous                            island was present at 39 cm. Biopsies were taken                            with a cold forceps for histology. Estimated blood                            loss was minimal.                           The examined esophagus was otherwise normal.                            Biopsies were obtained from the proximal and distal                            esophagus with cold forceps for histology of                            suspected eosinophilic esophagitis.                           Striped moderately erythematous mucosa without                            bleeding was found in the gastric body. Biopsies                            were taken with a cold forceps for histology. A                            small hiatal hernia is present.                           The examined duodenum was normal.  Biopsies for                            histology were taken with a cold forceps for                            evaluation of celiac disease. Estimated blood loss                            was minimal.                           The cardia and gastric  fundus were normal on                            retroflexion.                           The exam was otherwise without abnormality. Complications:            No immediate complications. Estimated blood loss:                            Minimal. Estimated Blood Loss:     Estimated blood loss was minimal. Impression:               - Esophageal mucosal changes suspicious for                            short-segment Barrett's esophagus. Biopsied.                           - Normal esophagus. Biopsied.                           - Erythematous mucosa in the gastric body. Biopsied.                           - Small hiatal hernia.                           - Normal examined duodenum. Biopsied.                           - The examination was otherwise normal. Recommendation:           - Patient has a contact number available for                            emergencies. The signs and symptoms of potential                            delayed complications were discussed with the                            patient. Return to normal activities tomorrow.                            Written discharge instructions were provided to the                            patient.                           - Resume regular diet today.                           - Continue present medications. Increase  pantoprazole to 40 mg twice daily for 8 weeks.                            Continue Pepcid 20 mg BID. You may use the Carafate                            as needed for additional symptomatic relief.                           - Avoid all NSAIDs.                           - Await pathology results.                           - Follow-up encounter  with me in 2 weeks. Thornton Park MD, MD 05/27/2019 10:39:55 AM This report has been signed electronically.

## 2019-05-27 NOTE — Progress Notes (Signed)
Temps taken by CW V/S taken by Klemme

## 2019-05-27 NOTE — Patient Instructions (Addendum)
YOU HAD AN ENDOSCOPIC PROCEDURE TODAY AT [facility]:   Refer to the procedure report that was given to you for any specific questions about what was found during the examination.  If the procedure report does not answer your questions, please call your gastroenterologist to clarify.  If you requested that your care partner not be given the details of your procedure findings, then the procedure report has been included in a sealed envelope for you to review at your convenience later.  YOU SHOULD EXPECT: Some feelings of bloating in the abdomen. Passage of more gas than usual.  Walking can help get rid of the air that was put into your GI tract during the procedure and reduce the bloating. If you had a lower endoscopy (such as a colonoscopy or flexible sigmoidoscopy) you may notice spotting of blood in your stool or on the toilet paper. If you underwent a bowel prep for your procedure, you may not have a normal bowel movement for a few days.  Please Note:  You might notice some irritation and congestion in your nose or some drainage.  This is from the oxygen used during your procedure.  There is no need for concern and it should clear up in a day or so.  SYMPTOMS TO REPORT IMMEDIATELY:   Following upper endoscopy (EGD)  Vomiting of blood or coffee ground material  New chest pain or pain under the shoulder blades  Painful or persistently difficult swallowing  New shortness of breath  Fever of 100F or higher  Black, tarry-looking stools  For urgent or emergent issues, a gastroenterologist can be reached at any hour by calling [phone].   DIET:  We do recommend a small meal at first, but then you may proceed to your regular diet.  Drink plenty of fluids but you should avoid alcoholic beverages for 24 hours.  ACTIVITY:  You should plan to take it easy for the rest of today and you should NOT DRIVE or use heavy machinery until tomorrow (because of the sedation medicines used  during the test).    FOLLOW UP: Our staff will call the number listed on your records 48-72 hours following your procedure to check on you and address any questions or concerns that you may have regarding the information given to you following your procedure. If we do not reach you, we will leave a message.  We will attempt to reach you two times.  During this call, we will ask if you have developed any symptoms of COVID 19. If you develop any symptoms (ie: fever, flu-like symptoms, shortness of breath, cough etc.) before then, please call [phone].  If you test positive for Covid 19 in the 2 weeks post procedure, please call and report this information to [place].    If any biopsies were taken you will be contacted by phone or by letter within the next 1-3 weeks.  Please call us at [phone] if you have not heard about the biopsies in 3 weeks.    SIGNATURES/CONFIDENTIALITY: You and/or your care partner have signed paperwork which will be entered into your electronic medical record.  These signatures attest to the fact that that the information above on your After Visit Summary has been reviewed and is understood.  Full responsibility of the confidentiality of this discharge information lies with you and/or your care-partner.   Thank you for allowing [place] to provide your healthcare today.

## 2019-05-27 NOTE — Progress Notes (Signed)
PT taken to PACU. Monitors in place. VSS. Report given to RN. 

## 2019-05-30 ENCOUNTER — Telehealth: Payer: Self-pay | Admitting: *Deleted

## 2019-05-30 MED FILL — PANTOPRAZOLE SOD DR 40 MG T: 40 | 30 days supply | Qty: 60 | Fill #4

## 2019-05-30 NOTE — Telephone Encounter (Signed)
Spoke with the patient and scheduled patient for f/u virtual visit with Dr. Tarri Glenn post UGI, 7/15 at 4:00 pm.

## 2019-05-31 ENCOUNTER — Telehealth: Payer: Self-pay | Admitting: *Deleted

## 2019-05-31 ENCOUNTER — Telehealth: Payer: Self-pay

## 2019-05-31 NOTE — Telephone Encounter (Signed)
  Follow up Call-  Call back number 05/27/2019  Post procedure Call Back phone  # [phone]  Permission to leave phone message Yes  Some recent data might be hidden     Patient questions:  Do you have a fever, pain , or abdominal swelling? No. Pain Score  0 *  Have you tolerated food without any problems? Yes.    Have you been able to return to your normal activities? Yes.    Do you have any questions about your discharge instructions: Diet   No. Medications  No. Follow up visit  No.  Do you have questions or concerns about your Care? No.  Actions: * If pain score is 4 or above: No action needed, pain <4.  1. Have you developed a fever since your procedure? no  2.   Have you had an respiratory symptoms (SOB or cough) since your procedure? no  3.   Have you tested positive for COVID 19 since your procedure no  4.   Have you had any family members/close contacts diagnosed with the COVID 19 since your procedure?  no   If yes to any of these questions please route to Joylene John, RN and Alphonsa Gin, Therapist, sports.

## 2019-05-31 NOTE — Telephone Encounter (Signed)
  Follow up Call-  Call back number 05/27/2019  Post procedure Call Back phone  # [phone]  Permission to leave phone message Yes  Some recent data might be hidden    Left message Will try again midday

## 2019-06-01 ENCOUNTER — Encounter: Payer: Self-pay | Admitting: Gastroenterology

## 2019-06-01 MED FILL — ZOLPIDEM TARTRATE 5 MG TAB: 5 | 30 days supply | Qty: 30 | Fill #1

## 2019-06-03 ENCOUNTER — Ambulatory Visit
Admission: RE | Admit: 2019-06-03 | Discharge: 2019-06-03 | Disposition: A | Discharge status: Home / Self Care | Payer: 59 | Source: Ambulatory Visit | Attending: Family Medicine | Admitting: Family Medicine

## 2019-06-03 ENCOUNTER — Other Ambulatory Visit: Payer: Self-pay

## 2019-06-03 DIAGNOSIS — Z1231 Encounter for screening mammogram for malignant neoplasm of breast: Secondary | ICD-10-CM | POA: Diagnosis not present

## 2019-06-17 MED FILL — SERTRALINE HCL 100 MG TAB: 100 | 30 days supply | Qty: 45 | Fill #0

## 2019-06-17 MED FILL — SUCRALFATE 1 GM TABLET: 1 | 30 days supply | Qty: 120 | Fill #0

## 2019-06-20 DIAGNOSIS — M797 Fibromyalgia: Secondary | ICD-10-CM | POA: Diagnosis not present

## 2019-06-20 DIAGNOSIS — M2550 Pain in unspecified joint: Secondary | ICD-10-CM | POA: Diagnosis not present

## 2019-06-20 DIAGNOSIS — Z79899 Other long term (current) drug therapy: Secondary | ICD-10-CM | POA: Diagnosis not present

## 2019-06-20 DIAGNOSIS — M150 Primary generalized (osteo)arthritis: Secondary | ICD-10-CM | POA: Diagnosis not present

## 2019-06-21 MED FILL — METHOCARBAMOL 500 MG TABS: 500 | 30 days supply | Qty: 90 | Fill #2

## 2019-06-22 ENCOUNTER — Ambulatory Visit: Payer: 59 | Admitting: Gastroenterology

## 2019-06-23 MED FILL — DICLOFENAC SODIUM 75 MG TAB: 75 | 30 days supply | Qty: 60 | Fill #3

## 2019-06-29 DIAGNOSIS — M25511 Pain in right shoulder: Secondary | ICD-10-CM | POA: Diagnosis not present

## 2019-06-29 MED FILL — PANTOPRAZOLE SOD DR 40 MG T: 40 | 30 days supply | Qty: 60 | Fill #5

## 2019-07-08 DIAGNOSIS — N390 Urinary tract infection, site not specified: Secondary | ICD-10-CM | POA: Diagnosis not present

## 2019-07-11 MED FILL — ZOLPIDEM TARTRATE 5 MG TAB: 5 | 30 days supply | Qty: 30 | Fill #2

## 2019-07-11 MED FILL — SUCRALFATE 1 GM TABLET: 1 | 30 days supply | Qty: 120 | Fill #1

## 2019-07-11 MED FILL — SERTRALINE HCL 100 MG TAB: 100 | 30 days supply | Qty: 45 | Fill #1

## 2019-07-22 DIAGNOSIS — R300 Dysuria: Secondary | ICD-10-CM | POA: Diagnosis not present

## 2019-07-22 DIAGNOSIS — N390 Urinary tract infection, site not specified: Secondary | ICD-10-CM | POA: Diagnosis not present

## 2019-07-23 MED FILL — DICLOFENAC SODIUM 75 MG TAB: 75 | 30 days supply | Qty: 60 | Fill #4

## 2019-07-26 DIAGNOSIS — M76891 Other specified enthesopathies of right lower limb, excluding foot: Secondary | ICD-10-CM | POA: Diagnosis not present

## 2019-07-26 DIAGNOSIS — M79652 Pain in left thigh: Secondary | ICD-10-CM | POA: Diagnosis not present

## 2019-07-26 DIAGNOSIS — M76892 Other specified enthesopathies of left lower limb, excluding foot: Secondary | ICD-10-CM | POA: Diagnosis not present

## 2019-07-26 DIAGNOSIS — M79651 Pain in right thigh: Secondary | ICD-10-CM | POA: Diagnosis not present

## 2019-07-27 DIAGNOSIS — M7541 Impingement syndrome of right shoulder: Secondary | ICD-10-CM | POA: Diagnosis not present

## 2019-07-27 DIAGNOSIS — M25511 Pain in right shoulder: Secondary | ICD-10-CM | POA: Diagnosis not present

## 2019-07-28 ENCOUNTER — Other Ambulatory Visit (HOSPITAL_COMMUNITY): Payer: Self-pay | Admitting: Orthopedic Surgery

## 2019-07-28 ENCOUNTER — Other Ambulatory Visit: Payer: Self-pay | Admitting: Orthopedic Surgery

## 2019-07-28 DIAGNOSIS — G8929 Other chronic pain: Secondary | ICD-10-CM

## 2019-07-29 MED FILL — PANTOPRAZOLE SOD DR 40 MG T: 40 | 90 days supply | Qty: 180 | Fill #0

## 2019-07-29 MED FILL — valACYclovir HCL 1 GM TABS: 1 | 5 days supply | Qty: 10 | Fill #1

## 2019-07-30 ENCOUNTER — Ambulatory Visit (HOSPITAL_COMMUNITY): Payer: 59

## 2019-08-08 ENCOUNTER — Ambulatory Visit (HOSPITAL_COMMUNITY)
Admission: RE | Admit: 2019-08-08 | Discharge: 2019-08-08 | Disposition: A | Discharge status: Home / Self Care | Payer: 59 | Source: Ambulatory Visit | Attending: Orthopedic Surgery | Admitting: Orthopedic Surgery

## 2019-08-08 ENCOUNTER — Other Ambulatory Visit: Payer: Self-pay

## 2019-08-08 DIAGNOSIS — M25511 Pain in right shoulder: Secondary | ICD-10-CM | POA: Insufficient documentation

## 2019-08-08 DIAGNOSIS — G8929 Other chronic pain: Secondary | ICD-10-CM | POA: Insufficient documentation

## 2019-08-09 MED FILL — ZOLPIDEM TARTRATE 5 MG TAB: 5 | 30 days supply | Qty: 30 | Fill #3

## 2019-08-10 MED FILL — SUCRALFATE 1 GM TABLET: 1 | 30 days supply | Qty: 120 | Fill #2

## 2019-08-10 MED FILL — SERTRALINE HCL 100 MG TAB: 100 | 30 days supply | Qty: 45 | Fill #2

## 2019-08-17 DIAGNOSIS — M25511 Pain in right shoulder: Secondary | ICD-10-CM | POA: Diagnosis not present

## 2019-08-17 DIAGNOSIS — M7541 Impingement syndrome of right shoulder: Secondary | ICD-10-CM | POA: Diagnosis not present

## 2019-08-18 MED FILL — traMADol HCL 50 MG TABS: 50 | 30 days supply | Qty: 60 | Fill #0

## 2019-08-19 DIAGNOSIS — M76892 Other specified enthesopathies of left lower limb, excluding foot: Secondary | ICD-10-CM | POA: Diagnosis not present

## 2019-08-22 MED FILL — DICLOFENAC SODIUM 75 MG TAB: 75 | 30 days supply | Qty: 60 | Fill #0

## 2019-08-30 DIAGNOSIS — M25511 Pain in right shoulder: Secondary | ICD-10-CM | POA: Diagnosis not present

## 2019-08-30 DIAGNOSIS — M7541 Impingement syndrome of right shoulder: Secondary | ICD-10-CM | POA: Diagnosis not present

## 2019-09-05 ENCOUNTER — Telehealth: Payer: Self-pay | Admitting: Gastroenterology

## 2019-09-05 NOTE — Telephone Encounter (Addendum)
FYI- Spoke with the patient who reports a return of her symptoms. The patient was previously scheduled for f/u with Dr. Tarri Glenn after her upper GI which was performed in June. The patient cancelled this f/u appt due to "feeling better." The patient stated she has resumed the carafate and the atypical chest pain which she describes as "it feels like there is a lump in my throat" returned. The patient denies jaw pain, arm pain, SOB or any other cardiac symptoms. She states "it feels exactly like it did before." The patient has been scheduled on 10/13 at 1:50 pm for an in person OV with Dr. Tarri Glenn.

## 2019-09-05 NOTE — Telephone Encounter (Signed)
I agree with this plan. Thank you! 

## 2019-09-05 NOTE — Telephone Encounter (Signed)
Pt reported that Carafate and famotidine have helped her GERD symptoms, but now symptoms have returned and pt is experiencing a heaviness in her chest.  Please advise.

## 2019-09-07 DIAGNOSIS — M7541 Impingement syndrome of right shoulder: Secondary | ICD-10-CM | POA: Diagnosis not present

## 2019-09-07 DIAGNOSIS — M25511 Pain in right shoulder: Secondary | ICD-10-CM | POA: Diagnosis not present

## 2019-09-09 MED FILL — ZOLPIDEM TARTRATE 5 MG TAB: 5 | 30 days supply | Qty: 30 | Fill #4

## 2019-09-12 MED FILL — SERTRALINE HCL 100 MG TAB: 100 | 30 days supply | Qty: 45 | Fill #0

## 2019-09-14 DIAGNOSIS — M7541 Impingement syndrome of right shoulder: Secondary | ICD-10-CM | POA: Diagnosis not present

## 2019-09-14 DIAGNOSIS — M25511 Pain in right shoulder: Secondary | ICD-10-CM | POA: Diagnosis not present

## 2019-09-15 MED FILL — traMADol HCL 50 MG TABS: 50 | 30 days supply | Qty: 60 | Fill #0

## 2019-09-19 DIAGNOSIS — M25511 Pain in right shoulder: Secondary | ICD-10-CM | POA: Diagnosis not present

## 2019-09-19 DIAGNOSIS — M7541 Impingement syndrome of right shoulder: Secondary | ICD-10-CM | POA: Diagnosis not present

## 2019-09-20 ENCOUNTER — Encounter: Payer: Self-pay | Admitting: Gastroenterology

## 2019-09-20 ENCOUNTER — Ambulatory Visit: Payer: 59 | Admitting: Gastroenterology

## 2019-09-20 ENCOUNTER — Other Ambulatory Visit: Payer: Self-pay

## 2019-09-20 VITALS — BP 110/68 | HR 60 | Temp 98.3°F | Ht 65.0 in | Wt 184.6 lb

## 2019-09-20 DIAGNOSIS — R0989 Other specified symptoms and signs involving the circulatory and respiratory systems: Secondary | ICD-10-CM

## 2019-09-20 DIAGNOSIS — R142 Eructation: Secondary | ICD-10-CM | POA: Diagnosis not present

## 2019-09-20 DIAGNOSIS — R7309 Other abnormal glucose: Secondary | ICD-10-CM | POA: Diagnosis not present

## 2019-09-20 DIAGNOSIS — R198 Other specified symptoms and signs involving the digestive system and abdomen: Secondary | ICD-10-CM

## 2019-09-20 MED ORDER — ESOMEPRAZOLE MAGNESIUM 40 MG PO CPDR: 40.0000 mg | DELAYED_RELEASE_CAPSULE | Freq: Two times a day (BID) | ORAL | 1 refills | Status: DC

## 2019-09-20 MED FILL — ESOMEPRAZOLE MAG DR 40 MG C: 40 | 30 days supply | Qty: 60 | Fill #0

## 2019-09-20 NOTE — Progress Notes (Signed)
Referring Provider: Lyman Bishop, DO Primary Care Physician:  Lyman Bishop, DO   Reason for Consultation:  GERD, belching   IMPRESSION:  Eructations    - EGD 05/2019: reflux, small hiatal hernia Recent atypical chest pain, now resolved GERD controlled on PPI Chronic NSAIDs for fibromyalgia (Diclofenac) Echogenic liver on ultrasound    - ALT 27, AST 26    - attempting weight loss   Eructations may be related to incompletely treated GERD given her endoscopy findings. Differential also includes NERD, functional dyspepsia, supragastric belching, SIBO.  Increase PPI to BID dosing x 8 weeks.  If no response to empiric treatment, will proceed with 24 hour pH/impedence testing to evaluate for supragastric belching and air swallowing.   PLAN: Work to maintain a healthy weight Change pantoprazole to Nexium 40 mg BID x 8 weeks Use Cafate and Pepcid BID as needed for breakthrough symptoms Avoid caffeine, carbonation, sugar substitutes, and dairy 24 hour pH/impedence testing to evaluate for supragastric belching and air swallowing if not improving after 8 weeks of twice daily Nexium Follow-up visit 2 weeks after the impedence testing  HPI: Priscilla Underwood is a 58 y.o. female who returns for further evaluation of GERD. She is on a pantoprazole given her chronic diclofenac for fibromyalgia. The interval history is obtained through the patient and review of her electronic health record.   She had an EGD 05/27/19:  Esophageal mucosal changes suspicious for short-segment Barrett's esophagus but biopsies only showed reflux. Gastritis. Small hiatal hernia. Normal duodenal mucosa. No H pylori. GERD without intestinal metaplasia. Biopsies negative for EOE.   Reports ongoing belching without heartburn, associated chest heaviness and globus. Starts as Priscilla as she has her morning coffee. No other NSAIDs except for ASA for headaches. No dysphagia, odynophagia, dysphonia, sore throat. No  distension, flatus, or change in bowel habits. No blood or mucous in the stool. Good appetite. Weight stable.  Continues on pantoprazole with 40 mg QAM. Does not notice a difference with BID dosing, although she hasn't consistently used it this way. Using Pepcid 2o mg QAM not BID.  Resumed carafate when her symptoms worsened.  Her husband wants her to be on Nexium because he thinks this works better.   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   Abdominal imaging: Abdominal ultrasound 03/06/17: echogenic liver Abdominal ultrasound for epigastric pain 04/22/19 showed an echogenic liver and was otherwise normal.   Endoscopic History:  - Normal colonoscopy 06/16/16 with Penelope Coop. - EGD from 02/12/2017 performed in [facility] when she was having upper abdominal pain showed a small hiatal hernia, gastric erosions, biopsies negative for H pylori and was otherwise normal.  - Colonoscopy 09/23/13 with Dr. Liane Comber: left-sided diverticulosis. Colonoscopy recommended in 10 years.  - EGD 05/27/19:  - Esophageal mucosal changes suspicious for short-segment Barrett's esophagus. Gastritis. Small hiatal hernia. Normal duodenal mucosa. No H pylori. GERD without intestinal metaplasia. Biopsies negative for EOE.   Records provided by the patient show: EGD with Dr. Nicki Reaper for epigastric pain 03/03/17: small hiatal hernia, erosive gastropathy (chronic inflammation, no H pylori). Abd ultrasound 03/06/17: echogenic liver.  Left-sided diverticulitis 09/2013.  Colonoscopy 09/23/13 with Dr. Liane Comber: left-sided diverticulosis. Colonoscopy recommended in 10 years. EGD 09/23/13: LA grade A reflux esophagitis, HH. Normal colonoscopy 06/16/06 with Dr. Penelope Coop.   Past Medical History:  Diagnosis Date  . Arthritis    osteo arthritis  . Depression   . Fibromyalgia   .  GERD (gastroesophageal reflux disease)   . PONV (postoperative nausea and vomiting)     Past Surgical History:   Procedure Laterality Date  . BUNIONECTOMY  2004  . CARPAL TUNNEL RELEASE Left 06/15/2015   Procedure: LEFT CARPAL TUNNEL RELEASE;  Surgeon: Roseanne Kaufman, MD;  Location: [place];  Service: Orthopedics;  Laterality: Left;  . CARPAL TUNNEL RELEASE Right 08/31/2015   Procedure: RIGHT LIMITED OPEN CARPAL TUNNEL RELEASE;  Surgeon: Roseanne Kaufman, MD;  Location: [place];  Service: Orthopedics;  Laterality: Right;  . LIPOMA EXCISION Right 05/28/2018   Procedure: EXCISION LIPOMA OF SCALP;  Surgeon: Jerrell Belfast, MD;  Location: [place];  Service: ENT;  Laterality: Right;  . SHOULDER SURGERY  2010, 2008  . TARSAL TUNNEL RELEASE Left 2012  . TUBAL LIGATION  1985    Current Outpatient Medications  Medication Sig Dispense Refill  . diclofenac (VOLTAREN) 75 MG EC tablet Take 75 mg by mouth 2 (two) times daily. Reported on 05/07/2016    . famotidine (PEPCID) 10 MG tablet Take 10 mg by mouth 2 (two) times daily.    . methocarbamol (ROBAXIN) 500 MG tablet Take 500 mg by mouth every 8 (eight) hours as needed for muscle spasms.    . pantoprazole (PROTONIX) 40 MG tablet Take 40 mg by mouth daily.    . sertraline (ZOLOFT) 100 MG tablet   5  . sertraline (ZOLOFT) 50 MG tablet Take 50 mg by mouth daily.    . sucralfate (CARAFATE) 1 g tablet Take 1 g by mouth 4 (four) times daily -  with meals and at bedtime.    . valACYclovir (VALTREX) 1000 MG tablet   2  . zolpidem (AMBIEN) 5 MG tablet   1   No current facility-administered medications for this visit.     Allergies as of 09/20/2019 - Review Complete 09/20/2019  Allergen Reaction Noted  . Ciprofloxacin Rash 04/14/2014  . Avelox [moxifloxacin hcl in nacl] Rash 08/27/2015    Family History  Problem Relation Age of Onset  . Breast cancer Sister 26  . Breast cancer Paternal Aunt 26  . Lung cancer Mother   . Heart disease Father   . Colon cancer Neg Hx   . Esophageal cancer Neg Hx   . Rectal cancer  Neg Hx   . Stomach cancer Neg Hx     Social History   Socioeconomic History  . Marital status: Married    Spouse name: Not on file  . Number of children: Not on file  . Years of education: Not on file  . Highest education level: Not on file  Occupational History  . Not on file  Social Needs  . Financial resource strain: Not on file  . Food insecurity    Worry: Not on file    Inability: Not on file  . Transportation needs    Medical: Not on file    Non-medical: Not on file  Tobacco Use  . Smoking status: Former Smoker    Types: Cigarettes  . Smokeless tobacco: Never Used  . Tobacco comment: Quit 35 years ago  Substance and Sexual Activity  . Alcohol use: No  . Drug use: No  . Sexual activity: Yes    Birth control/protection: Post-menopausal  Lifestyle  . Physical activity    Days per week: Not on file    Minutes per session: Not on file  . Stress: Not on file  Relationships  . Social connections  Talks on phone: Not on file    Gets together: Not on file    Attends religious service: Not on file    Active member of club or organization: Not on file    Attends meetings of clubs or organizations: Not on file    Relationship status: Not on file  . Intimate partner violence    Fear of current or ex partner: Not on file    Emotionally abused: Not on file    Physically abused: Not on file    Forced sexual activity: Not on file  Other Topics Concern  . Not on file  Social History Narrative  . Not on file    Review of Systems: ALL ROS discussed and all others negative except listed in HPI.  Physical Exam: Complete physical exam not performed due to the limits inherent in a telehealth encounter.  General: Awake, alert, and oriented, and well communicative. In no acute distress.  HEENT: EOMI, non-icteric sclera, NCAT, MMM  Neck: Normal movement of head and neck  Pulm: No labored breathing, speaking in full sentences without conversational dyspnea  Derm: No  apparent lesions or bruising in visible field  MS: Moves all visible extremities without noticeable abnormality  Psych: Pleasant, cooperative, normal speech, normal affect and normal insight Neuro: Alert and appropriate   Darris Carachure L. Tarri Glenn, MD, MPH [facility] 09/20/2019, 2:07 PM

## 2019-09-20 NOTE — Patient Instructions (Addendum)
Work to maintain a healthy weight.   Stop pantoprazole and start Nexium 40 mg BID x 8 weeks.  Use Cafate and Pepcid twice daily as needed during this time.   I am recommending a 24 hour pH/impedence testing if you are not feeling better after 8 weeks of twice daily Nexium.  Let's plan to lollow-up visit 2 weeks after the impedence testing. If you are feeling well enough to cancel the testing, still keep this appointment with me.   Constant burping can be annoying - and embarrassing! But there are some simple steps that can help you squelch belching. The key is to reduce the amount of air you swallow.  Start by looking at some simple habits.   Avoid "high-air" foods and beverages like carbonated beverages and whipped desserts.   After eating, consider taking a stroll rather than plunking down in front of the TV. Staying upright and moving helps your stomach empty and relieves bloated feelings. When it's time to go to bed, try sleeping on your stomach or right side to aid in the escape of gas and alleviate fullness.  Some people swear by eating brown rice or barley broth regularly. Papaya and pineapple are also said to help.   Whatever you eat, chew foods slowly, avoid washing meals down with liquids, and try to eat smaller servings.   Swallowing a lot of air (aerophagia) can be related to stress or anxiety, and treating these underlying issues may help calm your digestive tract. Please talk to your primary care doctor if you think this might be helpful. Good luck with your move! I know that comes with all kinds of stress.   You have been scheduled for an esophageal manometry at [facility] on 12/13/2019 at 830am. Please arrive 30 minutes prior to your procedure for registration. You will need to go to outpatient registration (1st floor of the hospital) first. Make certain to bring your insurance cards as well as a complete list of medications.  Please remember the following:  1) Do  not take any muscle relaxants, xanax (alprazolam) or ativan for 1 day prior to your test as well as the day of the test.  2) Nothing to eat or drink for 4 hours before your test.  3) Hold all diabetic medications/insulin the morning of the test. You may eat and take your medications after the test.  It will take at least 2 weeks to receive the results of this test from your physician. ------------------------------------------ ABOUT ESOPHAGEAL MANOMETRY Esophageal manometry (muh-NOM-uh-tree) is a test that gauges how well your esophagus works. Your esophagus is the long, muscular tube that connects your throat to your stomach. Esophageal manometry measures the rhythmic muscle contractions (peristalsis) that occur in your esophagus when you swallow. Esophageal manometry also measures the coordination and force exerted by the muscles of your esophagus.  During esophageal manometry, a thin, flexible tube (catheter) that contains sensors is passed through your nose, down your esophagus and into your stomach. Esophageal manometry can be helpful in diagnosing some mostly uncommon disorders that affect your esophagus.  Why it's done Esophageal manometry is used to evaluate the movement (motility) of food through the esophagus and into the stomach. The test measures how well the circular bands of muscle (sphincters) at the top and bottom of your esophagus open and close, as well as the pressure, strength and pattern of the wave of esophageal muscle contractions that moves food along.  What you can expect Esophageal manometry is an outpatient  procedure done without sedation. Most people tolerate it well. You may be asked to change into a hospital gown before the test starts.  During esophageal manometry  . While you are sitting up, a member of your health care team sprays your throat with a numbing medication or puts numbing gel in your nose or both.  . A catheter is guided through your nose into your  esophagus. The catheter may be sheathed in a water-filled sleeve. It doesn't interfere with your breathing. However, your eyes may water, and you may gag. You may have a slight nosebleed from irritation.  . After the catheter is in place, you may be asked to lie on your back on an exam table, or you may be asked to remain seated.  . You then swallow small sips of water. As you do, a computer connected to the catheter records the pressure, strength and pattern of your esophageal muscle contractions.  . During the test, you'll be asked to breathe slowly and smoothly, remain as still as possible, and swallow only when you're asked to do so.  . A member of your health care team may move the catheter down into your stomach while the catheter continues its measurements.  . The catheter then is slowly withdrawn. The test usually lasts 20 to 30 minutes.  After esophageal manometry  When your esophageal manometry is complete, you may return to your normal activities  This test typically takes 30-45 minutes to complete.  Due to recent COVID-19 restrictions implemented by our local and state authorities and in an effort to keep both patients and staff as safe as possible, our hospital system now requires COVID-19 testing prior to any scheduled hospital procedure. Please go to our [facility] location drive thru testing site (this is directly across from our office but on GPS will read an address of  [place],  [phone]) on 12/10/2019, any time between 9 am - 3 pm. You will not be billed at the time of testing but may receive a bill later depending on your insurance. The approximate cost of the test is $100. You must agree to quarantine from the time of your testing until the procedure date on 12/13/2019 . This should include staying at home with ONLY the people you live with. Avoid take-out, grocery store shopping or leaving the house for any non-emergent reason. Please call our  office at [phone] if you have any questions.  ________________________________________________________________________________

## 2019-09-27 DIAGNOSIS — R140 Abdominal distension (gaseous): Secondary | ICD-10-CM | POA: Diagnosis not present

## 2019-10-08 IMAGING — MR MR LUMBAR SPINE W/O CM
4 of 5 series · 18 of 48 positions shown · non-contrast
Comparison: Lumbar MRI 09/27/2016, radiographs 10/06/2016 and
abdominopelvic CT 03/18/2018.

CLINICAL DATA: Low back pain with bilateral buttock and leg pain
for 2 years. Pain worse on the left. No previous relevant surgery.

EXAM:
MRI LUMBAR SPINE WITHOUT CONTRAST
TECHNIQUE: Multiplanar, multisequence MR imaging of the lumbar spine was
performed. No intravenous contrast was administered.

[Series 3: T1 · sagittal · 4.0mm · 0.51mm/px · 3 of 15 slices shown (1 of 2)]
[im 1/15]
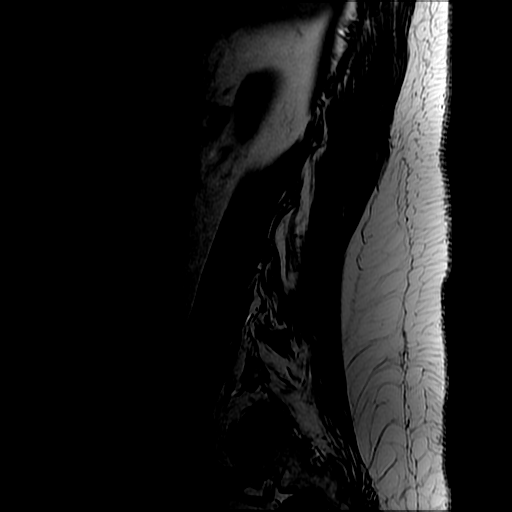
[im 8/15]
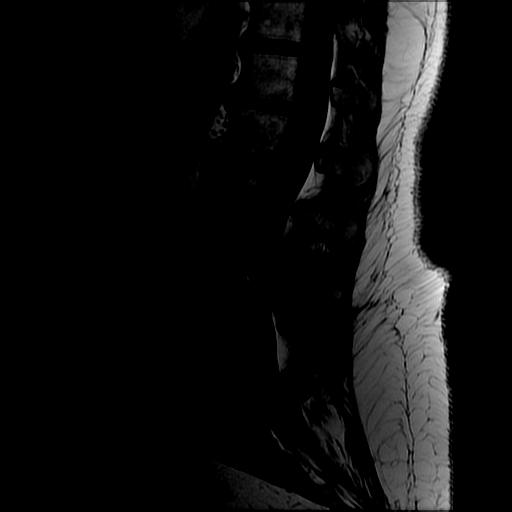
[im 15/15]
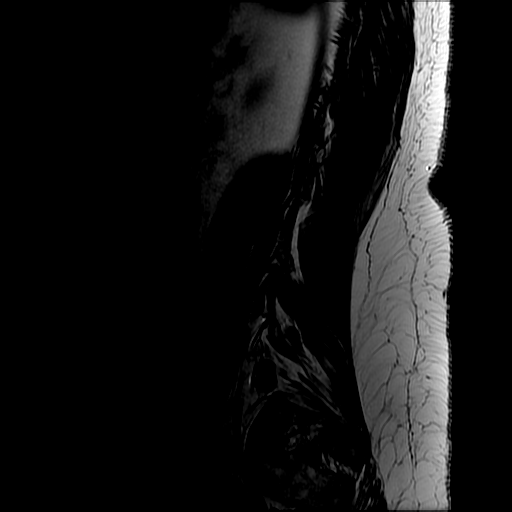

[Series 4: T2 post-contrast · sagittal · 4.0mm · 0.51mm/px · 5 of 15 slices shown]
[im 1/15]
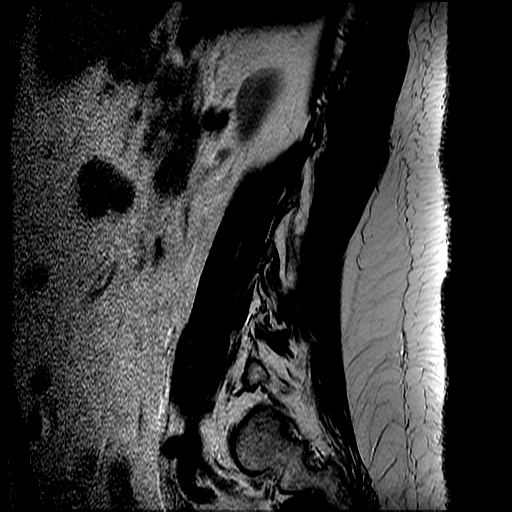
[im 4/15]
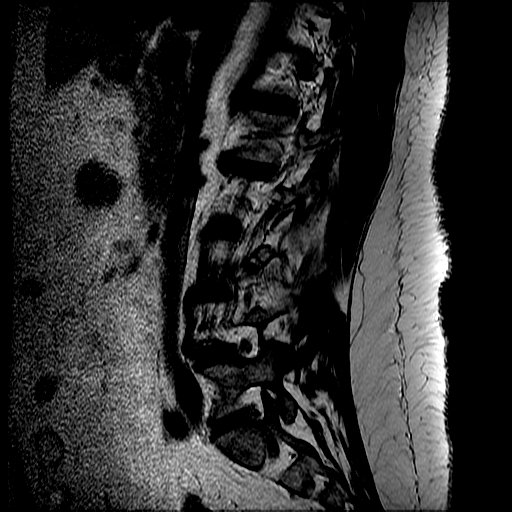
[im 8/15]
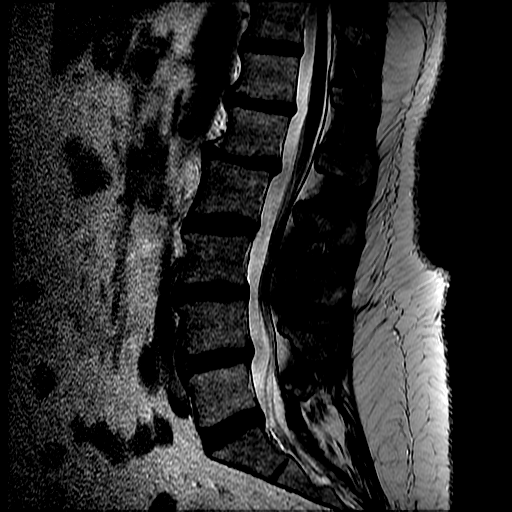
[im 11/15]
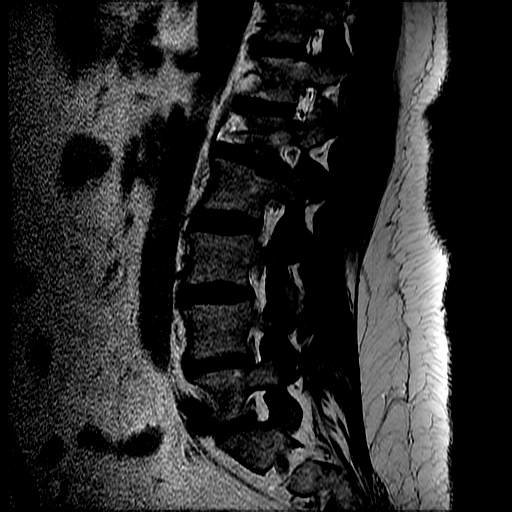
[im 15/15]
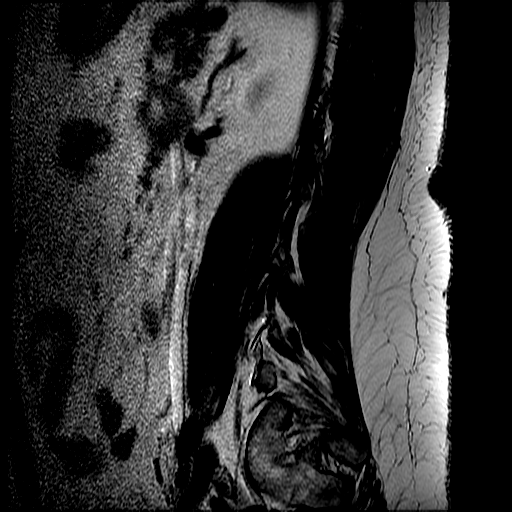

[Series 6: T2 · axial · 4.0mm · 0.39mm/px · z∈[-121,+69]mm · 7 of 42 slices shown]
[im 3/42]
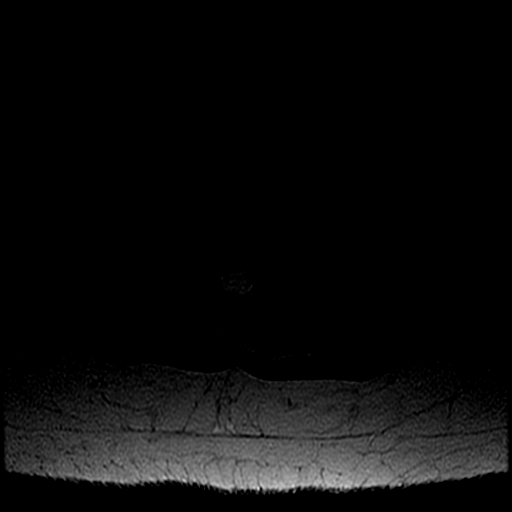
[im 6/42]
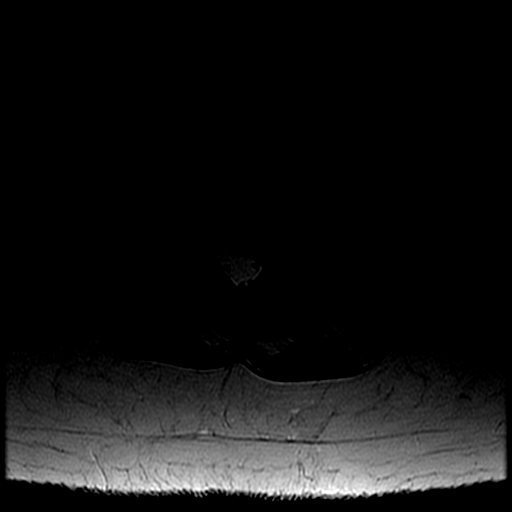
[im 9/42]
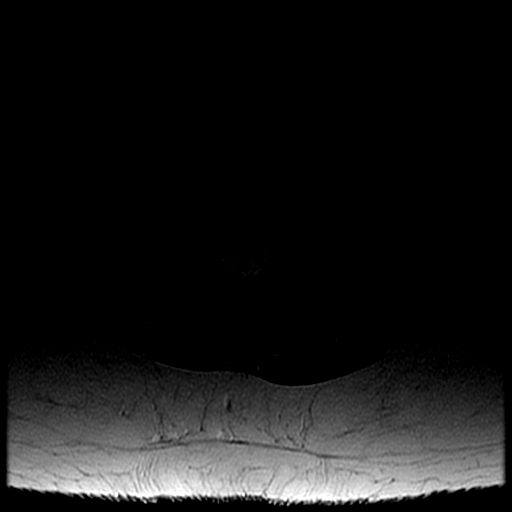
[im 14/42]
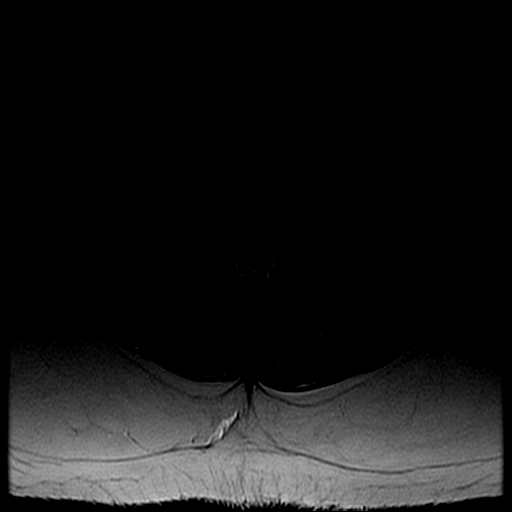
[im 20/42]
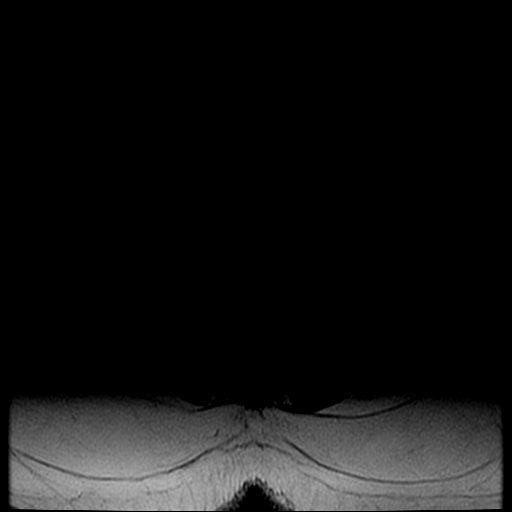
[im 22/42]
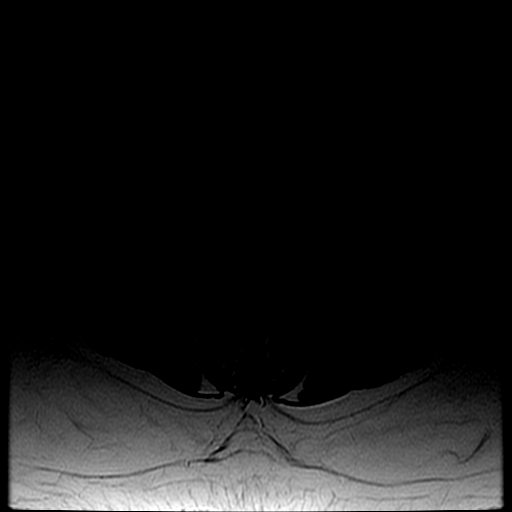
[im 36/42]
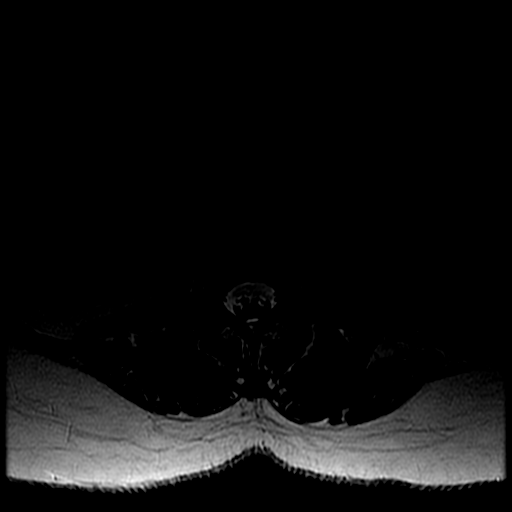

[Series 7: T1 · axial · 4.0mm · 0.39mm/px · z∈[-106,+69]mm · 3 of 42 slices shown (2 of 2)]
[im 6/42]
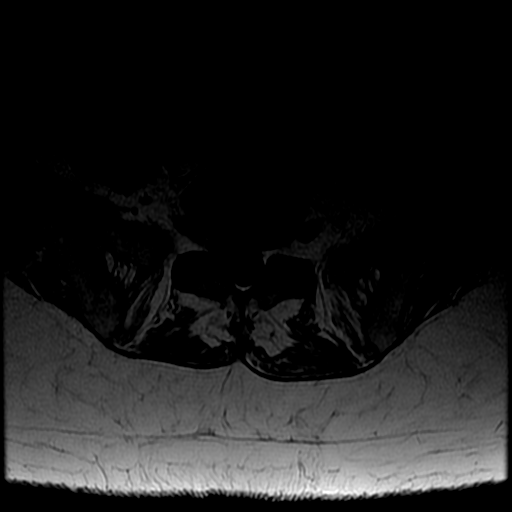
[im 22/42]
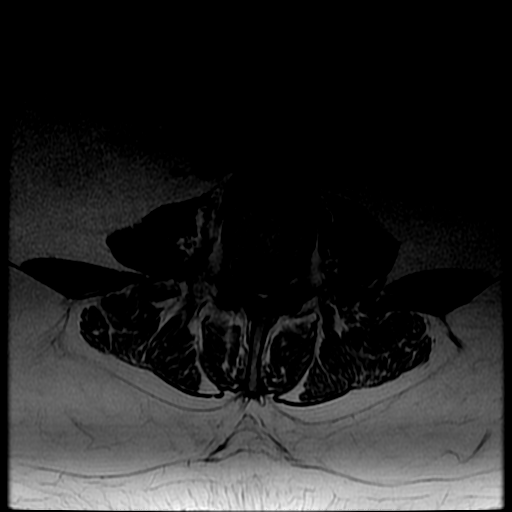
[im 36/42]
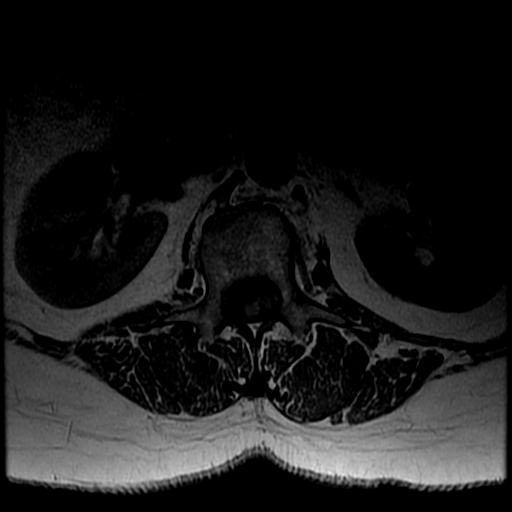

[18 of 48 positions shown; findings below may reference images not displayed]

FINDINGS: Segmentation: Conventional anatomy assumed, with the last open disc
space designated L5-S1.This is concordant with previous imaging.

Alignment: Mild convex left scoliosis. Near anatomic lateral
alignment.

Vertebrae: No worrisome osseous lesion, acute fracture or pars
defect. Endplate degenerative changes at L1-2, asymmetric to the
left. The visualized sacroiliac joints appear unremarkable.

Conus medullaris: Extends to the L1-2 level and appears normal.

Paraspinal and other soft tissues: No significant paraspinal
findings. Probable small hemorrhagic cyst in the upper pole of the
right kidney, stable.

Disc levels:

T12-L1: Stable disc bulging and anterior osteophytes. No spinal
stenosis or nerve root encroachment.

L1-2: Chronic degenerative disc disease with annular disc bulging
and endplate osteophytes asymmetric to the left. There is stable
left foraminal narrowing without L1 nerve root encroachment.

L2-3: Disc height and hydration are largely maintained. There is
stable mild disc bulging eccentric to the right without resulting
spinal stenosis or nerve root encroachment.

L3-4: Stable mild disc bulging eccentric to the right. Mild facet
and ligamentous hypertrophy. No spinal stenosis or nerve root
encroachment.

L4-5: Stable loss of disc height and annular disc bulging eccentric
to the left. Mild facet and ligamentous hypertrophy. No spinal
stenosis or nerve root encroachment.

L5-S1: Disc height is maintained. Mild disc bulging and facet
hypertrophy. No spinal stenosis or nerve root encroachment.
Conjoined right L5 and S1 nerve root sleeves.
IMPRESSION: 1. Overall findings are similar to the previous MRI from [DATE]. No acute findings, significant spinal stenosis or definite nerve
root encroachment.
3. Multilevel spondylosis, most advanced at L1-2 where there is
asymmetric disc bulging and endplate osteophyte formation on the
left with resulting mild chronic left foraminal narrowing.

## 2019-10-12 MED FILL — SERTRALINE HCL 100 MG TAB: 100 | 30 days supply | Qty: 45 | Fill #1

## 2019-10-14 MED FILL — traMADol HCL 50 MG TABS: 50 | 30 days supply | Qty: 60 | Fill #0

## 2019-10-14 MED FILL — ESOMEPRAZOLE MAG DR 40 MG C: 40 | 30 days supply | Qty: 60 | Fill #1

## 2019-10-19 MED FILL — traMADol HCL 50 MG TABS: 50 | 30 days supply | Qty: 60 | Fill #0

## 2019-10-20 MED FILL — ZOLPIDEM TARTRATE 5 MG TAB: 5 | 30 days supply | Qty: 30 | Fill #5

## 2019-10-21 MED FILL — PANTOPRAZOLE SOD DR 40 MG T: 40 | 90 days supply | Qty: 180 | Fill #1

## 2019-11-10 ENCOUNTER — Other Ambulatory Visit: Payer: Self-pay | Admitting: *Deleted

## 2019-11-10 ENCOUNTER — Emergency Department (HOSPITAL_COMMUNITY): Payer: 59

## 2019-11-10 ENCOUNTER — Encounter (HOSPITAL_COMMUNITY): Payer: Self-pay | Admitting: Emergency Medicine

## 2019-11-10 ENCOUNTER — Emergency Department (HOSPITAL_COMMUNITY)
Admission: EM | Admit: 2019-11-10 | Discharge: 2019-11-11 | Disposition: A | Discharge status: Home / Self Care | Payer: 59 | Attending: Emergency Medicine | Admitting: Emergency Medicine

## 2019-11-10 ENCOUNTER — Other Ambulatory Visit: Payer: Self-pay

## 2019-11-10 ENCOUNTER — Telehealth: Payer: Self-pay | Admitting: Gastroenterology

## 2019-11-10 DIAGNOSIS — R072 Precordial pain: Secondary | ICD-10-CM | POA: Diagnosis not present

## 2019-11-10 DIAGNOSIS — Z87891 Personal history of nicotine dependence: Secondary | ICD-10-CM | POA: Insufficient documentation

## 2019-11-10 DIAGNOSIS — Z79899 Other long term (current) drug therapy: Secondary | ICD-10-CM | POA: Insufficient documentation

## 2019-11-10 DIAGNOSIS — R0789 Other chest pain: Secondary | ICD-10-CM | POA: Diagnosis present

## 2019-11-10 DIAGNOSIS — R079 Chest pain, unspecified: Secondary | ICD-10-CM | POA: Diagnosis not present

## 2019-11-10 LAB — CBC
HCT: 39.1 % (ref 36.0–46.0)
Hemoglobin: 12.7 g/dL (ref 12.0–15.0)
MCH: 30.8 pg (ref 26.0–34.0)
MCHC: 32.5 g/dL (ref 30.0–36.0)
MCV: 94.7 fL (ref 80.0–100.0)
Platelets: 213 10*3/uL (ref 150–400)
RBC: 4.13 MIL/uL (ref 3.87–5.11)
RDW: 13.4 % (ref 11.5–15.5)
WBC: 6.1 10*3/uL (ref 4.0–10.5)
nRBC: 0 % (ref 0.0–0.2)

## 2019-11-10 LAB — BASIC METABOLIC PANEL
Anion gap: 8 (ref 5–15)
BUN: 15 mg/dL (ref 6–20)
CO2: 25 mmol/L (ref 22–32)
Calcium: 9.4 mg/dL (ref 8.9–10.3)
Chloride: 107 mmol/L (ref 98–111)
Creatinine, Ser: 0.82 mg/dL (ref 0.44–1.00)
GFR calc Af Amer: >60 mL/min (ref >60)
GFR calc non Af Amer: >60 mL/min (ref >60)
Glucose, Bld: 99 mg/dL (ref 70–99)
Potassium: 4.1 mmol/L (ref 3.5–5.1)
Sodium: 140 mmol/L (ref 135–145)

## 2019-11-10 LAB — TROPONIN I (HIGH SENSITIVITY)
Troponin I (High Sensitivity): 3 ng/L (ref <18)
Troponin I (High Sensitivity): 3 ng/L (ref <18)

## 2019-11-10 MED ORDER — SODIUM CHLORIDE 0.9% FLUSH: 3.0000 mL | Freq: Once | INTRAVENOUS | Status: DC

## 2019-11-10 NOTE — Telephone Encounter (Signed)
Spoke with the patient after calling [facility] to ask if they had an earlier date to offer the patient for her esophageal manometry. They did not, this was communicated with the patient. Verbalized understanding.

## 2019-11-10 NOTE — ED Triage Notes (Signed)
Pt. Stated, Priscilla Underwood been having tightness in my chest for months. I was here in May for symptoms in my heart and ruled anything out in my heart. Today it seemed worse in my chest and some back, shoulder blade pain.

## 2019-11-11 DIAGNOSIS — R072 Precordial pain: Secondary | ICD-10-CM | POA: Diagnosis not present

## 2019-11-11 DIAGNOSIS — Z79899 Other long term (current) drug therapy: Secondary | ICD-10-CM | POA: Diagnosis not present

## 2019-11-11 DIAGNOSIS — Z87891 Personal history of nicotine dependence: Secondary | ICD-10-CM | POA: Diagnosis not present

## 2019-11-11 MED ORDER — ALUM & MAG HYDROXIDE-SIMETH 200-200-20 MG/5ML PO SUSP
30.0000 mL | Freq: Once | ORAL | Status: AC
  Administered 2019-11-11: 30 mL via ORAL
  Filled 2019-11-11: qty 30

## 2019-11-11 MED FILL — SERTRALINE HCL 100 MG TAB: 100 | 30 days supply | Qty: 45 | Fill #2

## 2019-11-11 NOTE — ED Provider Notes (Signed)
[place] EMERGENCY DEPARTMENT Provider Note   CSN: [id number] Arrival date & time: 11/10/19  1825     History   Chief Complaint Chief Complaint  Patient presents with  . Chest Pain  . Back Pain    HPI Priscilla Underwood is a 58 y.o. female.  HPI: A 58 year old patient presents for evaluation of chest pain. Initial onset of pain was more than 6 hours ago. The patient's chest pain is described as heaviness/pressure/tightness and is not worse with exertion. The patient's chest pain is middle- or left-sided, is not well-localized, is not sharp and does radiate to the arms/jaw/neck. The patient does not complain of nausea and denies diaphoresis. The patient has no history of stroke, has no history of peripheral artery disease, has not smoked in the past 90 days, denies any history of treated diabetes, has no relevant family history of coronary artery disease (first degree relative at less than age 66), is not hypertensive, has no history of hypercholesterolemia and does not have an elevated BMI (>=30).   The history is provided by the patient.  Chest Pain Pain severity:  Moderate Timing:  Constant Chronicity:  Chronic Relieved by:  Nothing Worsened by:  Nothing Associated symptoms: back pain and heartburn   Associated symptoms: no cough, no fever, no shortness of breath and no vomiting   Risk factors: no coronary artery disease   Back Pain Associated symptoms: chest pain   Associated symptoms: no fever   Patient reports constant chest pain for months.  She reports history of GERD is on Nexium/Carafate/Pepcid without relief.  She reports over the past several days she has had worsening chest pain and some pain into her right shoulder.  No fevers or vomiting.  No shortness of breath. Has had extensive evaluation by gastroenterology, and is due for esophageal manometry soon She was concerned that this may be worsening due to her heart. No known history of CAD/CVA/PE  Past Medical History:  Diagnosis Date  . Arthritis    osteo arthritis  . Depression   . Fibromyalgia   . GERD (gastroesophageal reflux disease)   . PONV (postoperative nausea and vomiting)     Patient Active Problem List   Diagnosis Date Noted  . 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 BUNIONECTOMY  2004  . CARPAL TUNNEL RELEASE Left 06/15/2015   Procedure: LEFT CARPAL TUNNEL RELEASE;  Surgeon: Roseanne Kaufman, MD;  Location: [place];  Service: Orthopedics;  Laterality: Left;  . CARPAL TUNNEL RELEASE Right 08/31/2015   Procedure: RIGHT LIMITED OPEN CARPAL TUNNEL RELEASE;  Surgeon: Roseanne Kaufman, MD;  Location: [place];  Service: Orthopedics;  Laterality: Right;  . LIPOMA EXCISION Right 05/28/2018   Procedure: EXCISION LIPOMA OF SCALP;  Surgeon: Jerrell Belfast, MD;  Location: [place];  Service: ENT;  Laterality: Right;  . SHOULDER SURGERY  2010, 2008  . TARSAL TUNNEL RELEASE Left 2012  . TUBAL LIGATION  1985     OB History   No obstetric history on file.      Home Medications    Prior to Admission medications   Medication Sig Start Date End Date Taking? Authorizing Provider  diclofenac (VOLTAREN) 75 MG EC tablet Take 75 mg by mouth 2 (two) times daily. Reported on 05/07/2016    [provider]  esomeprazole (NEXIUM) 40 MG capsule Take 1 capsule (40 mg total) by mouth 2 (two) times daily  before a meal. 09/20/19   Thornton Park, MD  famotidine (PEPCID) 10 MG tablet Take 10 mg by mouth 2 (two) times daily.    [provider]  methocarbamol (ROBAXIN) 500 MG tablet Take 500 mg by mouth every 8 (eight) hours as needed for muscle spasms.    [provider]  sertraline (ZOLOFT) 100 MG tablet  01/01/17   [provider]  sertraline (ZOLOFT) 50 MG tablet Take 50 mg by mouth daily.    [provider]  sucralfate (CARAFATE) 1 g tablet Take 1 g  by mouth 4 (four) times daily -  with meals and at bedtime.    [provider]  valACYclovir (VALTREX) 1000 MG tablet  01/20/17   [provider]  zolpidem (AMBIEN) 5 MG tablet  01/01/17   [provider]    Family History Family History  Problem Relation Age of Onset  . Breast cancer Sister 79  . Breast cancer Paternal Aunt 61  . Lung cancer Mother   . Heart disease Father   . Colon cancer Neg Hx   . Esophageal cancer Neg Hx   . Rectal cancer Neg Hx   . Stomach cancer Neg Hx     Social History Social History   Tobacco Use  . Smoking status: Former Smoker    Types: Cigarettes  . Smokeless tobacco: Never Used  . Tobacco comment: Quit 35 years ago  Substance Use Topics  . Alcohol use: No  . Drug use: No     Allergies   Ciprofloxacin and Avelox [moxifloxacin hcl in nacl]   Review of Systems Review of Systems  Constitutional: Negative for fever.  Respiratory: Negative for cough and shortness of breath.   Cardiovascular: Positive for chest pain.  Gastrointestinal: Positive for heartburn. Negative for blood in stool and vomiting.  Musculoskeletal: Positive for back pain.  All other systems reviewed and are negative.    Physical Exam Updated Vital Signs BP 116/75   Pulse (!) 53   Temp 98.4 F (36.9 C) (Oral)   Resp 18   Ht 1.651 m (5\' 5" )   Wt 81.6 kg   SpO2 96%   BMI 29.95 kg/m   Physical Exam CONSTITUTIONAL: Well developed/well nourished HEAD: Normocephalic/atraumatic EYES: EOMI/PERRL ENMT: Mucous membranes moist NECK: supple no meningeal signs SPINE/BACK:entire spine nontender CV: S1/S2 noted, no murmurs/rubs/gallops noted LUNGS: Lungs are clear to auscultation bilaterally, no apparent distress ABDOMEN: soft, nontender, no rebound or guarding, bowel sounds noted throughout abdomen, no RUQ tender GU:no cva tenderness NEURO: Pt is awake/alert/appropriate, moves all extremitiesx4.  No facial droop.   EXTREMITIES: pulses  normal/equal, full ROM, no calf tenderness or edema SKIN: warm, color normal PSYCH: no abnormalities of mood noted, alert and oriented to situation   ED Treatments / Results  Labs (all labs ordered are listed, but only abnormal results are displayed) Labs Reviewed  BASIC METABOLIC PANEL  CBC  TROPONIN I (HIGH SENSITIVITY)  TROPONIN I (HIGH SENSITIVITY)    EKG EKG Interpretation  Date/Time:  Thursday November 10 2019 18:32:29 EST Ventricular Rate:  58 PR Interval:  138 QRS Duration: 80 QT Interval:  410 QTC Calculation: 402 R Axis:   54 Text Interpretation: Sinus bradycardia Otherwise normal ECG No significant change since last tracing Confirmed by Ripley Fraise [phone]) on 11/11/2019 12:09:16 AM   Radiology Dg Chest 2 View  Result Date: 11/10/2019 CLINICAL DATA:  Chest pain EXAM: CHEST - 2 VIEW COMPARISON:  None. FINDINGS: Scarring in the left base. Right  lung clear. Heart is normal size. No effusions or acute bony abnormality. IMPRESSION: No active cardiopulmonary disease. Electronically Signed   By: Rolm Baptise M.D.   On: 11/10/2019 19:14    Procedures Procedures   Medications Ordered in ED Medications  sodium chloride flush (NS) 0.9 % injection 3 mL (has no administration in time range)  alum & mag hydroxide-simeth (MAALOX/MYLANTA) 200-200-20 MG/5ML suspension 30 mL (30 mLs Oral Given 11/11/19 0041)     Initial Impression / Assessment and Plan / ED Course  I have reviewed the triage vital signs and the nursing notes.  Pertinent labs & imaging results that were available during my care of the patient were reviewed by me and considered in my medical decision making (see chart for details).     HEAR Score: 2  Labs are reassuring.  No acute EKG changes.  Patient's had chest pain for months.  Will refer to cardiology as an outpatient, may benefit from stress echocardiogram She requests GI cocktail prior to discharge.  We discussed return precautions Final  Clinical Impressions(s) / ED Diagnoses   Final diagnoses:  Precordial pain    ED Discharge Orders    None       Ripley Fraise, MD 11/11/19 [id number]

## 2019-11-11 NOTE — Discharge Instructions (Signed)

## 2019-11-15 ENCOUNTER — Ambulatory Visit: Payer: 59 | Admitting: Cardiology

## 2019-11-16 NOTE — Progress Notes (Signed)
Cardiology Office Note:    Date:  11/17/2019   ID:  Priscilla Underwood, DOB Sep 27, 1961, MRN [id number]  PCP:  Lyman Bishop, DO  Cardiologist:  No primary care provider on file.  Electrophysiologist:  None   Referring MD: Lyman Bishop, DO   Chief Complaint  Patient presents with   Chest Pain    History of Present Illness:    Priscilla Underwood is a 58 y.o. female with a hx of fibromyalgia who is referred by Dr Curly Rim for evaluation of chest pain.  She has had 2 ED visits for chest pain, in May 2020 and again on 11/10/19.  Recent ED visit for chest pain.  Troponins negative on both visits.  She reports that she started having chest pain in May.  Described as heaviness in center of chest.  3-4 out of 10 in intensity.  Constant pain.  For exercise will go for 30 to 40-minute walks.  She does not notice a worsening in chest pain with exertion.  Was referred to GI, underwent EGD which showed hiatal hernia.  She has been taking Nexium and Pepcid, but reports not helping with chest tightness.  Esophageal manometry planned for January.  Also reports intermittent pain in right upper quadrant of abdomen as well as pain radiating to the right shoulder.  She was told she had mitral valve prolapse years ago.  She smoked as a teenager but has not smoked since.  Father had CABG at age 51.  Thinks her mother had AF.    Past Medical History:  Diagnosis Date   Arthritis    osteo arthritis   Depression    Fibromyalgia    GERD (gastroesophageal reflux disease)    PONV (postoperative nausea and vomiting)     Past Surgical History:  Procedure Laterality Date   BUNIONECTOMY  2004   CARPAL TUNNEL RELEASE Left 06/15/2015   Procedure: LEFT CARPAL TUNNEL RELEASE;  Surgeon: Roseanne Kaufman, MD;  Location: [place];  Service: Orthopedics;  Laterality: Left;   CARPAL TUNNEL RELEASE Right 08/31/2015   Procedure: RIGHT LIMITED OPEN CARPAL TUNNEL RELEASE;  Surgeon: Roseanne Kaufman, MD;  Location: [place];  Service: Orthopedics;  Laterality: Right;   LIPOMA EXCISION Right 05/28/2018   Procedure: EXCISION LIPOMA OF SCALP;  Surgeon: Jerrell Belfast, MD;  Location: [facility];  Service: ENT;  Laterality: Right;   SHOULDER SURGERY  2010, 2008   TARSAL TUNNEL RELEASE Left 2012   TUBAL LIGATION  1985    Current Medications: Current Meds  Medication Sig   diclofenac (VOLTAREN) 75 MG EC tablet Take 75 mg by mouth 2 (two) times daily. Reported on 05/07/2016   esomeprazole (NEXIUM) 40 MG capsule Take 40 mg by mouth daily at 12 noon.   famotidine (PEPCID) 10 MG tablet Take 10 mg by mouth daily.   methocarbamol (ROBAXIN) 500 MG tablet Take 500 mg by mouth every 8 (eight) hours as needed for muscle spasms.   sertraline (ZOLOFT) 100 MG tablet Take 100 mg by mouth.    sertraline (ZOLOFT) 50 MG tablet Take 50 mg by mouth daily.   sucralfate (CARAFATE) 1 g tablet Take 1 g by mouth 4 (four) times daily -  with meals and at bedtime.   valACYclovir (VALTREX) 1000 MG tablet as needed.    zolpidem (AMBIEN) 5 MG tablet Take 5 mg by mouth at bedtime as needed for sleep.      Allergies:   Ciprofloxacin and Avelox [  moxifloxacin hcl in nacl]   Social History   Socioeconomic History   Marital status: Married    Spouse name: Not on file   Number of children: Not on file   Years of education: Not on file   Highest education level: Not on file  Occupational History   Not on file  Tobacco Use   Smoking status: Former Smoker    Types: Cigarettes   Smokeless tobacco: Never Used   Tobacco comment: Quit 35 years ago  Substance and Sexual Activity   Alcohol use: No   Drug use: No   Sexual activity: Yes    Birth control/protection: Post-menopausal  Other Topics Concern   Not on file  Social History Narrative   Not on file   Social Determinants of Health   Financial Resource Strain:    Difficulty of Paying Living  Expenses: Not on file  Food Insecurity:    Worried About Charity fundraiser in the Last Year: Not on file   [employer] of Food in the Last Year: Not on file  Transportation Needs:    Lack of Transportation (Medical): Not on file   Lack of Transportation (Non-Medical): Not on file  Physical Activity:    Days of Exercise per Week: Not on file   Minutes of Exercise per Session: Not on file  Stress:    Feeling of Stress : Not on file  Social Connections:    Frequency of Communication with Friends and Family: Not on file   Frequency of Social Gatherings with Friends and Family: Not on file   Attends Religious Services: Not on file   Active Member of Clubs or Organizations: Not on file   Attends Archivist Meetings: Not on file   Marital Status: Not on file     Family History: The patient's family history includes Breast cancer (age of onset: 32) in her sister; Breast cancer (age of onset: 3) in her paternal aunt; Heart disease in her father; Lung cancer in her mother. There is no history of Colon cancer, Esophageal cancer, Rectal cancer, or Stomach cancer.  ROS:   Please see the history of present illness.     All other systems reviewed and are negative.  EKGs/Labs/Other Studies Reviewed:    The following studies were reviewed today:   EKG:  EKG is  ordered today.  The ekg ordered today demonstrates sinus rhythm, rate 56, no ST/T abnormalities  Recent Labs: 04/19/2019: ALT 27 11/10/2019: BUN 15; Creatinine, Ser 0.82; Hemoglobin 12.7; Platelets 213; Potassium 4.1; Sodium 140  Recent Lipid Panel No results found for: CHOL, TRIG, HDL, CHOLHDL, VLDL, LDLCALC, LDLDIRECT  Physical Exam:    VS:  BP 132/90    Pulse (!) 56    Ht 5\' 5"  (1.651 m)    Wt 183 lb (83 kg)    BMI 30.45 kg/m     Wt Readings from Last 3 Encounters:  11/17/19 183 lb (83 kg)  11/10/19 180 lb (81.6 kg)  09/20/19 184 lb 9.6 oz (83.7 kg)     GEN:  Well nourished, well developed in no acute  distress HEENT: Normal NECK: No JVD; No carotid bruits LYMPHATICS: No lymphadenopathy CARDIAC: RRR, no murmurs, rubs, gallops RESPIRATORY:  Clear to auscultation without rales, wheezing or rhonchi  ABDOMEN: Soft, non-tender, non-distended MUSCULOSKELETAL:  No edema; No deformity  SKIN: Warm and dry NEUROLOGIC:  Alert and oriented x 3 PSYCHIATRIC:  Normal affect   ASSESSMENT:    1. Chest pain of  uncertain etiology   2. Right upper quadrant abdominal pain   3. Pre-procedure lab exam   4. Mitral valve prolapse    PLAN:     Chest pain: Atypical in description.  Risk factors for coronary disease include age and family history.  Overall would classify as intermediate risk for obstructive coronary disease -Coronary CTA  Possible mitral valve prolapse: Reportedly diagnosed years ago.  Will evaluate with TTE  Right upper quadrant pain: Reports intermittent pain radiating to the right shoulder.  Will check right upper quadrant ultrasound  RTC in 3 months   Medication Adjustments/Labs and Tests Ordered: Current medicines are reviewed at length with the patient today.  Concerns regarding medicines are outlined above.  Orders Placed This Encounter  Procedures   CT CORONARY MORPH W/CTA COR W/SCORE W/CA W/CM &/OR WO/CM   CT CORONARY FRACTIONAL FLOW RESERVE DATA PREP   CT CORONARY FRACTIONAL FLOW RESERVE FLUID ANALYSIS   US ABDOMEN RUQ W/ELASTOGRAPHY   Basic metabolic panel   ECHOCARDIOGRAM COMPLETE   Meds ordered this encounter  Medications   metoprolol tartrate (LOPRESSOR) 25 MG tablet    Sig: TAKE 1 TABLET 2 HOURS PRIOR TO CT    Dispense:  1 tablet    Refill:  0    Patient Instructions  Medication Instructions:  TAKE METOPROLOL 25 MG 2 HOURS PRIOR TO CT *If you need a refill on your cardiac medications before your next appointment, please call your pharmacy*  Lab Work: BMET 1 WEEK PRIOR TO CT  If you have labs (blood work) drawn today and your tests are completely  normal, you will receive your results only by:  MyChart Message (if you have MyChart) OR  A paper copy in the mail If you have any lab test that is abnormal or we need to change your treatment, we will call you to review the results.  Testing/Procedures: Your physician has requested that you have an echocardiogram. Echocardiography is a painless test that uses sound waves to create images of your heart. It provides your doctor with information about the size and shape of your heart and how well your hearts chambers and valves are working. This procedure takes approximately one hour. There are no restrictions for this procedure.  RIGHT UPPER QUAD ULTRASOUND  Your physician has requested that you have cardiac CT. Cardiac computed tomography (CT) is a painless test that uses an x-ray machine to take clear, detailed pictures of your heart. For further information please visit [employer]. Please follow instruction sheet as given. THE OFFICE WILL CALL YOU TO SCHEDULE ONCE INSURANCE HAS APPROVED   Follow-Up: At [facility], you and your health needs are our priority.  As part of our continuing mission to provide you with exceptional heart care, we have created designated Provider Care Teams.  These Care Teams include your primary Cardiologist (physician) and Advanced Practice Providers (APPs -  Physician Assistants and Nurse Practitioners) who all work together to provide you with the care you need, when you need it.  Your next appointment:   3 month(s)  The format for your next appointment:   In Person  Provider:   You may see DR Gardiner Rhyme or one of the following Advanced Practice Providers on your designated Care Team:    Rosaria Ferries, PA-C  Jory Sims, DNP, ANP  Cadence Kathlen Mody, NP  Other Instructions  Your cardiac CT will be scheduled at one of the below locations:   [facility] [address] [place], [place] [address] [phone]  [facility] [address] [place],  [address] [phone]  If scheduled at [facility], please arrive at the [facility] main entrance of [facility] 30-45 minutes prior to test start time. Proceed to the [facility] Radiology Department (first floor) to check-in and test prep.  If scheduled at [facility], please arrive 15 mins early for check-in and test prep.  Please follow these instructions carefully (unless otherwise directed):  On the Night Before the Test:  Be sure to Drink plenty of water.  Do not consume any caffeinated/decaffeinated beverages or chocolate 12 hours prior to your test.  Do not take any antihistamines 12 hours prior to your test.  On the Day of the Test:  Drink plenty of water. Do not drink any water within one hour of the test.  Do not eat any food 4 hours prior to the test.  You may take your regular medications prior to the test.   Take metoprolol (Lopressor) two hours prior to test.  HOLD Furosemide/Hydrochlorothiazide morning of the test.  FEMALES- please wear underwire-free bra if available      After the Test:  Drink plenty of water.  After receiving IV contrast, you may experience a mild flushed feeling. This is normal.  On occasion, you may experience a mild rash up to 24 hours after the test. This is not dangerous. If this occurs, you can take Benadryl 25 mg and increase your fluid intake.  If you experience trouble breathing, this can be serious. If it is severe call 911 IMMEDIATELY. If it is mild, please call our office.  If you take any of these medications: Glipizide/Metformin, Avandament, Glucavance, please do not take 48 hours after completing test unless otherwise instructed.   Once we have confirmed authorization from your insurance company, we will call you to set up a date and time for your test.   For  non-scheduling related questions, please contact the cardiac imaging nurse navigator should you have any questions/concerns: Marchia Bond, RN Navigator Cardiac Imaging [facility] [phone] Office     Cardiac CT Angiogram  A cardiac CT angiogram is a procedure to look at the heart and the area around the heart. It may be done to help find the cause of chest pains or other symptoms of heart disease. During this procedure, a large X-ray machine, called a CT scanner, takes detailed pictures of the heart and the surrounding area after a dye (contrast material) has been injected into blood vessels in the area. The procedure is also sometimes called a coronary CT angiogram, coronary artery scanning, or CTA. A cardiac CT angiogram allows the health care provider to see how well blood is flowing to and from the heart. The health care provider will be able to see if there are any problems, such as:  Blockage or narrowing of the coronary arteries in the heart.  Fluid around the heart.  Signs of weakness or disease in the muscles, valves, and tissues of the heart. Tell a health care provider about:  Any allergies you have. This is especially important if you have had a previous allergic reaction to contrast dye.  All medicines you are taking, including vitamins, herbs, eye drops, creams, and over-the-counter medicines.  Any blood disorders you have.  Any surgeries you have had.  Any medical conditions you have.  Whether you are pregnant or may be pregnant.  Any anxiety  disorders, chronic pain, or other conditions you have that may increase your stress or prevent you from lying still. What are the risks? Generally, this is a safe procedure. However, problems may occur, including:  Bleeding.  Infection.  Allergic reactions to medicines or dyes.  Damage to other structures or organs.  Kidney damage from the dye or contrast that is used.  Increased risk of  cancer from radiation exposure. This risk is low. Talk with your health care provider about: ? The risks and benefits of testing. ? How you can receive the lowest dose of radiation. What happens before the procedure?  Wear comfortable clothing and remove any jewelry, glasses, dentures, and hearing aids.  Follow instructions from your health care provider about eating and drinking. This may include: ? For 12 hours before the test -- avoid caffeine. This includes tea, coffee, soda, energy drinks, and diet pills. Drink plenty of water or other fluids that do not have caffeine in them. Being well-hydrated can prevent complications. ? For 4-6 hours before the test -- stop eating and drinking. The contrast dye can cause nausea, but this is less likely if your stomach is empty.  Ask your health care provider about changing or stopping your regular medicines. This is especially important if you are taking diabetes medicines, blood thinners, or medicines to treat erectile dysfunction. What happens during the procedure?  Hair on your chest may need to be removed so that small sticky patches called electrodes can be placed on your chest. These will transmit information that helps to monitor your heart during the test.  An IV tube will be inserted into one of your veins.  You might be given a medicine to control your heart rate during the test. This will help to ensure that good images are obtained.  You will be asked to lie on an exam table. This table will slide in and out of the CT machine during the procedure.  Contrast dye will be injected into the IV tube. You might feel warm, or you may get a metallic taste in your mouth.  You will be given a medicine (nitroglycerin) to relax (dilate) the arteries in your heart.  The table that you are lying on will move into the CT machine tunnel for the scan.  The person running the machine will give you instructions while the scans are being done. You may  be asked to: ? Keep your arms above your head. ? Hold your breath. ? Stay very still, even if the table is moving.  When the scanning is complete, you will be moved out of the machine.  The IV tube will be removed. The procedure may vary among health care providers and hospitals. What happens after the procedure?  You might feel warm, or you may get a metallic taste in your mouth from the contrast dye.  You may have a headache from the nitroglycerin.  After the procedure, drink water or other fluids to wash (flush) the contrast material out of your body.  Contact a health care provider if you have any symptoms of allergy to the contrast. These symptoms include: ? Shortness of breath. ? Rash or hives. ? A racing heartbeat.  Most people can return to their normal activities right after the procedure. Ask your health care provider what activities are safe for you.  It is up to you to get the results of your procedure. Ask your health care provider, or the department that is doing the procedure, when  your results will be ready. Summary  A cardiac CT angiogram is a procedure to look at the heart and the area around the heart. It may be done to help find the cause of chest pains or other symptoms of heart disease.  During this procedure, a large X-ray machine, called a CT scanner, takes detailed pictures of the heart and the surrounding area after a dye (contrast material) has been injected into blood vessels in the area.  Ask your health care provider about changing or stopping your regular medicines before the procedure. This is especially important if you are taking diabetes medicines, blood thinners, or medicines to treat erectile dysfunction.  After the procedure, drink water or other fluids to wash (flush) the contrast material out of your body. This information is not intended to replace advice given to you by your health care provider. Make sure you discuss any questions you have  with your health care provider. Document Released: 11/06/2008 Document Revised: 11/06/2017 Document Reviewed: 10/13/2016 Elsevier Patient Education  [facility].  Echocardiogram An echocardiogram is a procedure that uses painless sound waves (ultrasound) to produce an image of the heart. Images from an echocardiogram can provide important information about:  Signs of coronary artery disease (CAD).  Aneurysm detection. An aneurysm is a weak or damaged part of an artery wall that bulges out from the normal force of blood pumping through the body.  Heart size and shape. Changes in the size or shape of the heart can be associated with certain conditions, including heart failure, aneurysm, and CAD.  Heart muscle function.  Heart valve function.  Signs of a past heart attack.  Fluid buildup around the heart.  Thickening of the heart muscle.  A tumor or infectious growth around the heart valves. Tell a health care provider about:  Any allergies you have.  All medicines you are taking, including vitamins, herbs, eye drops, creams, and over-the-counter medicines.  Any blood disorders you have.  Any surgeries you have had.  Any medical conditions you have.  Whether you are pregnant or may be pregnant. What are the risks? Generally, this is a safe procedure. However, problems may occur, including:  Allergic reaction to dye (contrast) that may be used during the procedure. What happens before the procedure? No specific preparation is needed. You may eat and drink normally. What happens during the procedure?   An IV tube may be inserted into one of your veins.  You may receive contrast through this tube. A contrast is an injection that improves the quality of the pictures from your heart.  A gel will be applied to your chest.  A wand-like tool (transducer) will be moved over your chest. The gel will help to transmit the sound waves from the transducer.  The sound waves  will harmlessly bounce off of your heart to allow the heart images to be captured in real-time motion. The images will be recorded on a computer. The procedure may vary among health care providers and hospitals. What happens after the procedure?  You may return to your normal, everyday life, including diet, activities, and medicines, unless your health care provider tells you not to do that. Summary  An echocardiogram is a procedure that uses painless sound waves (ultrasound) to produce an image of the heart.  Images from an echocardiogram can provide important information about the size and shape of your heart, heart muscle function, heart valve function, and fluid buildup around your heart.  You do not need  to do anything to prepare before this procedure. You may eat and drink normally.  After the echocardiogram is completed, you may return to your normal, everyday life, unless your health care provider tells you not to do that. This information is not intended to replace advice given to you by your health care provider. Make sure you discuss any questions you have with your health care provider. Document Released: 11/21/2000 Document Revised: 03/17/2019 Document Reviewed: 12/27/2016 Elsevier Patient Education  2020 [place], Donato Heinz, MD  11/17/2019 8:55 AM    [place]

## 2019-11-17 ENCOUNTER — Ambulatory Visit: Payer: 59 | Admitting: Cardiology

## 2019-11-17 ENCOUNTER — Encounter: Payer: Self-pay | Admitting: Cardiology

## 2019-11-17 ENCOUNTER — Other Ambulatory Visit: Payer: Self-pay

## 2019-11-17 VITALS — BP 132/90 | HR 56 | Ht 65.0 in | Wt 183.0 lb

## 2019-11-17 DIAGNOSIS — R079 Chest pain, unspecified: Secondary | ICD-10-CM

## 2019-11-17 DIAGNOSIS — R1011 Right upper quadrant pain: Secondary | ICD-10-CM | POA: Diagnosis not present

## 2019-11-17 DIAGNOSIS — Z01812 Encounter for preprocedural laboratory examination: Secondary | ICD-10-CM

## 2019-11-17 DIAGNOSIS — I341 Nonrheumatic mitral (valve) prolapse: Secondary | ICD-10-CM

## 2019-11-17 MED ORDER — METOPROLOL TARTRATE 25 MG PO TABS: ORAL_TABLET | ORAL | 0 refills | Status: DC

## 2019-11-17 MED FILL — METOPROLOL TARTRATE 25 MG T: 25 | 1 days supply | Qty: 1 | Fill #0

## 2019-11-17 MED FILL — ZOLPIDEM TARTRATE 5 MG TAB: 5 | 30 days supply | Qty: 30 | Fill #0

## 2019-11-17 NOTE — Addendum Note (Signed)
Addended by: Alvina Filbert B on: 11/17/2019 09:17 AM   Modules accepted: Orders

## 2019-11-17 NOTE — Patient Instructions (Addendum)
Medication Instructions:  TAKE METOPROLOL 25 MG 2 HOURS PRIOR TO CT *If you need a refill on your cardiac medications before your next appointment, please call your pharmacy*  Lab Work: BMET 1 WEEK PRIOR TO CT  If you have labs (blood work) drawn today and your tests are completely normal, you will receive your results only by: Marland Kitchen MyChart Message (if you have MyChart) OR . A paper copy in the mail If you have any lab test that is abnormal or we need to change your treatment, we will call you to review the results.  Testing/Procedures: Your physician has requested that you have an echocardiogram. Echocardiography is a painless test that uses sound waves to create images of your heart. It provides your doctor with information about the size and shape of your heart and how well your heart's chambers and valves are working. This procedure takes approximately one hour. There are no restrictions for this procedure.  RIGHT UPPER QUAD ULTRASOUND  Your physician has requested that you have cardiac CT. Cardiac computed tomography (CT) is a painless test that uses an x-ray machine to take clear, detailed pictures of your heart. For further information please visit [email or web address]. Please follow instruction sheet as given. THE OFFICE WILL CALL YOU TO SCHEDULE ONCE INSURANCE HAS APPROVED   Follow-Up: At [facility], you and your health needs are our priority.  As part of our continuing mission to provide you with exceptional heart care, we have created designated Provider Care Teams.  These Care Teams include your primary Cardiologist (physician) and Advanced Practice Providers (APPs -  Physician Assistants and Nurse Practitioners) who all work together to provide you with the care you need, when you need it.  Your next appointment:   3 month(s)  The format for your next appointment:   In Person  Provider:   You may see DR Gardiner Rhyme or one of the following Advanced Practice Providers on your  designated Care Team:    Rosaria Ferries, PA-C  Jory Sims, DNP, ANP  Cadence Kathlen Mody, NP  Other Instructions  Your cardiac CT will be scheduled at one of the below locations:   [facility] [address] [place], [place] [address] [phone] [place] [address] [place],  [address] [phone]  If scheduled at [facility], please arrive at the [facility] main entrance of [facility] 30-45 minutes prior to test start time. Proceed to the [facility] Radiology Department (first floor) to check-in and test prep.  If scheduled at [facility], please arrive 15 mins early for check-in and test prep.  Please follow these instructions carefully (unless otherwise directed):  On the Night Before the Test: . Be sure to Drink plenty of water. . Do not consume any caffeinated/decaffeinated beverages or chocolate 12 hours prior to your test. . Do not take any antihistamines 12 hours prior to your test.  On the Day of the Test: . Drink plenty of water. Do not drink any water within one hour of the test. . Do not eat any food 4 hours prior to the test. . You may take your regular medications prior to the test.  . Take metoprolol (Lopressor) two hours prior to test. . HOLD Furosemide/Hydrochlorothiazide morning of the test. . FEMALES- please wear underwire-free bra if available      After the Test: . Drink plenty of water. . After receiving IV contrast, you may experience a  mild flushed feeling. This is normal. . On occasion, you may experience a mild rash up to 24 hours after the test. This is not dangerous. If this occurs, you can take Benadryl 25 mg and increase your fluid intake. . If you experience trouble breathing, this can be serious. If it is severe call 911 IMMEDIATELY. If it is mild, please call our office. . If you take any of these medications:  Glipizide/Metformin, Avandament, Glucavance, please do not take 48 hours after completing test unless otherwise instructed.   Once we have confirmed authorization from your insurance company, we will call you to set up a date and time for your test.   For non-scheduling related questions, please contact the cardiac imaging nurse navigator should you have any questions/concerns: Marchia Bond, RN Navigator Cardiac Imaging [facility] [phone] Office     Cardiac CT Angiogram  A cardiac CT angiogram is a procedure to look at the heart and the area around the heart. It may be done to help find the cause of chest pains or other symptoms of heart disease. During this procedure, a large X-ray machine, called a CT scanner, takes detailed pictures of the heart and the surrounding area after a dye (contrast material) has been injected into blood vessels in the area. The procedure is also sometimes called a coronary CT angiogram, coronary artery scanning, or CTA. A cardiac CT angiogram allows the health care provider to see how well blood is flowing to and from the heart. The health care provider will be able to see if there are any problems, such as:  Blockage or narrowing of the coronary arteries in the heart.  Fluid around the heart.  Signs of weakness or disease in the muscles, valves, and tissues of the heart. Tell a health care provider about:  Any allergies you have. This is especially important if you have had a previous allergic reaction to contrast dye.  All medicines you are taking, including vitamins, herbs, eye drops, creams, and over-the-counter medicines.  Any blood disorders you have.  Any surgeries you have had.  Any medical conditions you have.  Whether you are pregnant or may be pregnant.  Any anxiety disorders, chronic pain, or other conditions you have that may increase your stress or prevent you from lying still. What are the  risks? Generally, this is a safe procedure. However, problems may occur, including:  Bleeding.  Infection.  Allergic reactions to medicines or dyes.  Damage to other structures or organs.  Kidney damage from the dye or contrast that is used.  Increased risk of cancer from radiation exposure. This risk is low. Talk with your health care provider about: ? The risks and benefits of testing. ? How you can receive the lowest dose of radiation. What happens before the procedure?  Wear comfortable clothing and remove any jewelry, glasses, dentures, and hearing aids.  Follow instructions from your health care provider about eating and drinking. This may include: ? For 12 hours before the test - avoid caffeine. This includes tea, coffee, soda, energy drinks, and diet pills. Drink plenty of water or other fluids that do not have caffeine in them. Being well-hydrated can prevent complications. ? For 4-6 hours before the test - stop eating and drinking. The contrast dye can cause nausea, but this is less likely if your stomach is empty.  Ask your health care provider about changing or stopping your regular medicines. This is especially important if you are taking  diabetes medicines, blood thinners, or medicines to treat erectile dysfunction. What happens during the procedure?  Hair on your chest may need to be removed so that small sticky patches called electrodes can be placed on your chest. These will transmit information that helps to monitor your heart during the test.  An IV tube will be inserted into one of your veins.  You might be given a medicine to control your heart rate during the test. This will help to ensure that good images are obtained.  You will be asked to lie on an exam table. This table will slide in and out of the CT machine during the procedure.  Contrast dye will be injected into the IV tube. You might feel warm, or you may get a metallic taste in your mouth.  You  will be given a medicine (nitroglycerin) to relax (dilate) the arteries in your heart.  The table that you are lying on will move into the CT machine tunnel for the scan.  The person running the machine will give you instructions while the scans are being done. You may be asked to: ? Keep your arms above your head. ? Hold your breath. ? Stay very still, even if the table is moving.  When the scanning is complete, you will be moved out of the machine.  The IV tube will be removed. The procedure may vary among health care providers and hospitals. What happens after the procedure?  You might feel warm, or you may get a metallic taste in your mouth from the contrast dye.  You may have a headache from the nitroglycerin.  After the procedure, drink water or other fluids to wash (flush) the contrast material out of your body.  Contact a health care provider if you have any symptoms of allergy to the contrast. These symptoms include: ? Shortness of breath. ? Rash or hives. ? A racing heartbeat.  Most people can return to their normal activities right after the procedure. Ask your health care provider what activities are safe for you.  It is up to you to get the results of your procedure. Ask your health care provider, or the department that is doing the procedure, when your results will be ready. Summary  A cardiac CT angiogram is a procedure to look at the heart and the area around the heart. It may be done to help find the cause of chest pains or other symptoms of heart disease.  During this procedure, a large X-ray machine, called a CT scanner, takes detailed pictures of the heart and the surrounding area after a dye (contrast material) has been injected into blood vessels in the area.  Ask your health care provider about changing or stopping your regular medicines before the procedure. This is especially important if you are taking diabetes medicines, blood thinners, or medicines to  treat erectile dysfunction.  After the procedure, drink water or other fluids to wash (flush) the contrast material out of your body. This information is not intended to replace advice given to you by your health care provider. Make sure you discuss any questions you have with your health care provider. Document Released: 11/06/2008 Document Revised: 11/06/2017 Document Reviewed: 10/13/2016 Elsevier Patient Education  [employer].  Echocardiogram An echocardiogram is a procedure that uses painless sound waves (ultrasound) to produce an image of the heart. Images from an echocardiogram can provide important information about:  Signs of coronary artery disease (CAD).  Aneurysm detection. An aneurysm is a  weak or damaged part of an artery wall that bulges out from the normal force of blood pumping through the body.  Heart size and shape. Changes in the size or shape of the heart can be associated with certain conditions, including heart failure, aneurysm, and CAD.  Heart muscle function.  Heart valve function.  Signs of a past heart attack.  Fluid buildup around the heart.  Thickening of the heart muscle.  A tumor or infectious growth around the heart valves. Tell a health care provider about:  Any allergies you have.  All medicines you are taking, including vitamins, herbs, eye drops, creams, and over-the-counter medicines.  Any blood disorders you have.  Any surgeries you have had.  Any medical conditions you have.  Whether you are pregnant or may be pregnant. What are the risks? Generally, this is a safe procedure. However, problems may occur, including:  Allergic reaction to dye (contrast) that may be used during the procedure. What happens before the procedure? No specific preparation is needed. You may eat and drink normally. What happens during the procedure?   An IV tube may be inserted into one of your veins.  You may receive contrast through this tube.  A contrast is an injection that improves the quality of the pictures from your heart.  A gel will be applied to your chest.  A wand-like tool (transducer) will be moved over your chest. The gel will help to transmit the sound waves from the transducer.  The sound waves will harmlessly bounce off of your heart to allow the heart images to be captured in real-time motion. The images will be recorded on a computer. The procedure may vary among health care providers and hospitals. What happens after the procedure?  You may return to your normal, everyday life, including diet, activities, and medicines, unless your health care provider tells you not to do that. Summary  An echocardiogram is a procedure that uses painless sound waves (ultrasound) to produce an image of the heart.  Images from an echocardiogram can provide important information about the size and shape of your heart, heart muscle function, heart valve function, and fluid buildup around your heart.  You do not need to do anything to prepare before this procedure. You may eat and drink normally.  After the echocardiogram is completed, you may return to your normal, everyday life, unless your health care provider tells you not to do that. This information is not intended to replace advice given to you by your health care provider. Make sure you discuss any questions you have with your health care provider. Document Released: 11/21/2000 Document Revised: 03/17/2019 Document Reviewed: 12/27/2016 Elsevier Patient Education  2020 [employer].

## 2019-11-18 ENCOUNTER — Other Ambulatory Visit: Payer: Self-pay | Admitting: Cardiology

## 2019-11-22 ENCOUNTER — Ambulatory Visit (HOSPITAL_COMMUNITY): Payer: 59 | Attending: Cardiovascular Disease

## 2019-11-22 ENCOUNTER — Other Ambulatory Visit: Payer: Self-pay

## 2019-11-22 DIAGNOSIS — R079 Chest pain, unspecified: Secondary | ICD-10-CM | POA: Diagnosis not present

## 2019-11-22 DIAGNOSIS — Z01812 Encounter for preprocedural laboratory examination: Secondary | ICD-10-CM | POA: Insufficient documentation

## 2019-11-22 DIAGNOSIS — R1011 Right upper quadrant pain: Secondary | ICD-10-CM | POA: Diagnosis not present

## 2019-11-23 ENCOUNTER — Other Ambulatory Visit: Payer: Self-pay | Admitting: Cardiology

## 2019-11-26 ENCOUNTER — Other Ambulatory Visit: Payer: Self-pay | Admitting: Cardiology

## 2019-11-28 ENCOUNTER — Other Ambulatory Visit: Payer: Self-pay | Admitting: Cardiology

## 2019-11-28 ENCOUNTER — Other Ambulatory Visit: Payer: Self-pay

## 2019-11-28 MED ORDER — METOPROLOL TARTRATE 25 MG PO TABS: ORAL_TABLET | ORAL | 0 refills | Status: DC

## 2019-11-28 MED FILL — METOPROLOL TARTRATE 25 MG T: 25 | 1 days supply | Qty: 1 | Fill #0

## 2019-12-05 ENCOUNTER — Ambulatory Visit
Admission: RE | Admit: 2019-12-05 | Discharge: 2019-12-05 | Disposition: A | Discharge status: Home / Self Care | Payer: 59 | Source: Ambulatory Visit | Attending: Cardiology | Admitting: Cardiology

## 2019-12-05 DIAGNOSIS — R079 Chest pain, unspecified: Secondary | ICD-10-CM

## 2019-12-05 DIAGNOSIS — R1011 Right upper quadrant pain: Secondary | ICD-10-CM

## 2019-12-05 DIAGNOSIS — K760 Fatty (change of) liver, not elsewhere classified: Secondary | ICD-10-CM | POA: Diagnosis not present

## 2019-12-05 DIAGNOSIS — Z01812 Encounter for preprocedural laboratory examination: Secondary | ICD-10-CM

## 2019-12-10 ENCOUNTER — Other Ambulatory Visit (HOSPITAL_COMMUNITY): Admission: RE | Admit: 2019-12-10 | Payer: 59 | Source: Ambulatory Visit

## 2019-12-12 ENCOUNTER — Other Ambulatory Visit (HOSPITAL_COMMUNITY)
Admission: RE | Admit: 2019-12-12 | Discharge: 2019-12-12 | Disposition: A | Discharge status: Home / Self Care | Payer: 59 | Source: Ambulatory Visit | Attending: Gastroenterology | Admitting: Gastroenterology

## 2019-12-12 DIAGNOSIS — Z20822 Contact with and (suspected) exposure to covid-19: Secondary | ICD-10-CM | POA: Insufficient documentation

## 2019-12-12 DIAGNOSIS — Z01812 Encounter for preprocedural laboratory examination: Secondary | ICD-10-CM | POA: Diagnosis not present

## 2019-12-12 LAB — SARS CORONAVIRUS 2 (TAT 6-24 HRS): SARS Coronavirus 2: NEGATIVE

## 2019-12-14 ENCOUNTER — Ambulatory Visit (HOSPITAL_COMMUNITY)
Admission: RE | Admit: 2019-12-14 | Discharge: 2019-12-14 | Disposition: A | Discharge status: Home / Self Care | Payer: 59 | Attending: Gastroenterology | Admitting: Gastroenterology

## 2019-12-14 ENCOUNTER — Encounter (HOSPITAL_COMMUNITY)
Admission: RE | Disposition: A | Discharge status: Home / Self Care | Payer: Self-pay | Source: Home / Self Care | Attending: Gastroenterology

## 2019-12-14 ENCOUNTER — Other Ambulatory Visit: Payer: Self-pay

## 2019-12-14 DIAGNOSIS — R0989 Other specified symptoms and signs involving the circulatory and respiratory systems: Secondary | ICD-10-CM | POA: Diagnosis not present

## 2019-12-14 DIAGNOSIS — K219 Gastro-esophageal reflux disease without esophagitis: Secondary | ICD-10-CM

## 2019-12-14 DIAGNOSIS — R142 Eructation: Secondary | ICD-10-CM | POA: Diagnosis not present

## 2019-12-14 DIAGNOSIS — R198 Other specified symptoms and signs involving the digestive system and abdomen: Secondary | ICD-10-CM

## 2019-12-14 DIAGNOSIS — R09A2 Foreign body sensation, throat: Secondary | ICD-10-CM

## 2019-12-14 DIAGNOSIS — K760 Fatty (change of) liver, not elsewhere classified: Secondary | ICD-10-CM

## 2019-12-14 HISTORY — PX: 24 HOUR PH STUDY: SHX5419

## 2019-12-14 HISTORY — PX: ESOPHAGEAL MANOMETRY: SHX5429

## 2019-12-14 SURGERY — MANOMETRY, ESOPHAGUS

## 2019-12-14 MED ORDER — LIDOCAINE VISCOUS HCL 2 % MT SOLN
OROMUCOSAL | Status: AC
  Filled 2019-12-14: qty 15

## 2019-12-14 SURGICAL SUPPLY — 2 items
FACESHIELD LNG OPTICON STERILE (SAFETY) IMPLANT
GLOVE BIO SURGEON STRL SZ8 (GLOVE) ×4 IMPLANT

## 2019-12-14 NOTE — Progress Notes (Signed)
Esophageal manometry done per protocol.  Patient tolerated well.  PH probe then placed at 34 cm from left nare.  Patient verbalized understanding of use of equipment.  Patient also verbalized understanding of when to return to have probe removed.  Both reports to be sent to Dr. Harl Bowie.

## 2019-12-14 NOTE — Progress Notes (Signed)
a 

## 2019-12-15 MED FILL — ZOLPIDEM TARTRATE 5 MG TAB: 5 | 30 days supply | Qty: 30 | Fill #1

## 2019-12-15 MED FILL — traMADol HCL 50 MG TABS: 50 | 30 days supply | Qty: 60 | Fill #0

## 2019-12-16 ENCOUNTER — Encounter: Payer: Self-pay | Admitting: *Deleted

## 2019-12-19 DIAGNOSIS — R0989 Other specified symptoms and signs involving the circulatory and respiratory systems: Secondary | ICD-10-CM

## 2019-12-19 DIAGNOSIS — K219 Gastro-esophageal reflux disease without esophagitis: Secondary | ICD-10-CM

## 2019-12-19 DIAGNOSIS — R198 Other specified symptoms and signs involving the digestive system and abdomen: Secondary | ICD-10-CM

## 2019-12-19 DIAGNOSIS — R142 Eructation: Secondary | ICD-10-CM

## 2019-12-20 DIAGNOSIS — R079 Chest pain, unspecified: Secondary | ICD-10-CM | POA: Diagnosis not present

## 2019-12-20 DIAGNOSIS — Z01812 Encounter for preprocedural laboratory examination: Secondary | ICD-10-CM | POA: Diagnosis not present

## 2019-12-20 DIAGNOSIS — R1011 Right upper quadrant pain: Secondary | ICD-10-CM | POA: Diagnosis not present

## 2019-12-21 LAB — BASIC METABOLIC PANEL
BUN/Creatinine Ratio: 15 (ref 12–28)
BUN: 12 mg/dL (ref 8–27)
CO2: 21 mmol/L (ref 20–29)
Calcium: 9.1 mg/dL (ref 8.7–10.3)
Chloride: 108 mmol/L — ABNORMAL HIGH (ref 96–106)
Creatinine, Ser: 0.82 mg/dL (ref 0.57–1.00)
GFR calc Af Amer: 90 mL/min/{1.73_m2} (ref >59)
GFR calc non Af Amer: 78 mL/min/{1.73_m2} (ref >59)
Glucose: 89 mg/dL (ref 65–99)
Potassium: 4.6 mmol/L (ref 3.5–5.2)
Sodium: 143 mmol/L (ref 134–144)

## 2019-12-22 ENCOUNTER — Other Ambulatory Visit: Payer: Self-pay

## 2019-12-22 ENCOUNTER — Ambulatory Visit (HOSPITAL_COMMUNITY)
Admission: RE | Admit: 2019-12-22 | Discharge: 2019-12-22 | Disposition: A | Discharge status: Home / Self Care | Payer: 59 | Source: Ambulatory Visit | Attending: Cardiology | Admitting: Cardiology

## 2019-12-22 DIAGNOSIS — K760 Fatty (change of) liver, not elsewhere classified: Secondary | ICD-10-CM

## 2019-12-22 DIAGNOSIS — Z79899 Other long term (current) drug therapy: Secondary | ICD-10-CM | POA: Diagnosis not present

## 2019-12-22 DIAGNOSIS — M2550 Pain in unspecified joint: Secondary | ICD-10-CM | POA: Diagnosis not present

## 2019-12-22 MED ORDER — GADOBUTROL 1 MMOL/ML IV SOLN
8.0000 mL | Freq: Once | INTRAVENOUS | Status: AC | PRN
  Administered 2019-12-22: 8 mL via INTRAVENOUS

## 2019-12-22 MED FILL — ZOLPIDEM TARTRATE 5 MG TAB: 5 | 30 days supply | Qty: 30 | Fill #1

## 2019-12-27 DIAGNOSIS — F4323 Adjustment disorder with mixed anxiety and depressed mood: Secondary | ICD-10-CM | POA: Diagnosis not present

## 2019-12-27 DIAGNOSIS — F330 Major depressive disorder, recurrent, mild: Secondary | ICD-10-CM | POA: Diagnosis not present

## 2019-12-28 ENCOUNTER — Ambulatory Visit: Payer: 59 | Admitting: Cardiology

## 2020-01-04 ENCOUNTER — Other Ambulatory Visit: Payer: Self-pay | Admitting: *Deleted

## 2020-01-04 DIAGNOSIS — Z01812 Encounter for preprocedural laboratory examination: Secondary | ICD-10-CM

## 2020-01-04 LAB — BASIC METABOLIC PANEL
BUN/Creatinine Ratio: 17 (ref 12–28)
BUN: 13 mg/dL (ref 8–27)
CO2: 22 mmol/L (ref 20–29)
Calcium: 9.1 mg/dL (ref 8.7–10.3)
Chloride: 104 mmol/L (ref 96–106)
Creatinine, Ser: 0.76 mg/dL (ref 0.57–1.00)
GFR calc Af Amer: 99 mL/min/{1.73_m2} (ref >59)
GFR calc non Af Amer: 86 mL/min/{1.73_m2} (ref >59)
Glucose: 85 mg/dL (ref 65–99)
Potassium: 4.4 mmol/L (ref 3.5–5.2)
Sodium: 140 mmol/L (ref 134–144)

## 2020-01-04 NOTE — Progress Notes (Signed)
bmet  

## 2020-01-05 ENCOUNTER — Encounter (HOSPITAL_COMMUNITY): Payer: Self-pay

## 2020-01-05 ENCOUNTER — Telehealth (HOSPITAL_COMMUNITY): Payer: Self-pay | Admitting: Emergency Medicine

## 2020-01-05 NOTE — Telephone Encounter (Signed)
Reaching out to patient to offer assistance regarding upcoming cardiac imaging study; pt verbalizes understanding of appt date/time, parking situation and where to check in, pre-test NPO status and medications ordered, and verified current allergies; name and call back number provided for further questions should they arise Megann Easterwood RN Navigator Cardiac Imaging [facility] [phone] office [phone] cell 

## 2020-01-06 ENCOUNTER — Other Ambulatory Visit: Payer: Self-pay

## 2020-01-06 ENCOUNTER — Ambulatory Visit (HOSPITAL_COMMUNITY)
Admission: RE | Admit: 2020-01-06 | Discharge: 2020-01-06 | Disposition: A | Discharge status: Home / Self Care | Payer: 59 | Source: Ambulatory Visit | Attending: Cardiology | Admitting: Cardiology

## 2020-01-06 DIAGNOSIS — Z01812 Encounter for preprocedural laboratory examination: Secondary | ICD-10-CM | POA: Diagnosis not present

## 2020-01-06 DIAGNOSIS — R1011 Right upper quadrant pain: Secondary | ICD-10-CM | POA: Diagnosis not present

## 2020-01-06 DIAGNOSIS — R079 Chest pain, unspecified: Secondary | ICD-10-CM | POA: Diagnosis not present

## 2020-01-06 MED ORDER — IOHEXOL 350 MG/ML SOLN
100.0000 mL | Freq: Once | INTRAVENOUS | Status: AC | PRN
  Administered 2020-01-06: 100 mL via INTRAVENOUS

## 2020-01-06 MED ORDER — NITROGLYCERIN 0.4 MG SL SUBL
SUBLINGUAL_TABLET | SUBLINGUAL | Status: AC
  Filled 2020-01-06: qty 2

## 2020-01-11 DIAGNOSIS — F4323 Adjustment disorder with mixed anxiety and depressed mood: Secondary | ICD-10-CM | POA: Diagnosis not present

## 2020-01-11 DIAGNOSIS — F330 Major depressive disorder, recurrent, mild: Secondary | ICD-10-CM | POA: Diagnosis not present

## 2020-01-11 MED FILL — PANTOPRAZOLE SOD DR 40 MG T: 40 | 90 days supply | Qty: 180 | Fill #0

## 2020-01-11 MED FILL — SERTRALINE HCL 100 MG TAB: 100 | 30 days supply | Qty: 45 | Fill #3

## 2020-01-11 MED FILL — DICLOFENAC SODIUM 75 MG TAB: 75 | 30 days supply | Qty: 60 | Fill #1

## 2020-01-11 MED FILL — valACYclovir HCL 1 GM TABS: 1 | 5 days supply | Qty: 10 | Fill #0

## 2020-01-16 MED FILL — valACYclovir HCL 1 GM TABS: 1 | 5 days supply | Qty: 10 | Fill #1

## 2020-01-19 MED FILL — ZOLPIDEM TARTRATE 5 MG TAB: 5 | 30 days supply | Qty: 30 | Fill #2

## 2020-01-20 MED FILL — valACYclovir HCL 1 GM TABS: 1 | 5 days supply | Qty: 10 | Fill #0

## 2020-01-24 ENCOUNTER — Encounter: Payer: Self-pay | Admitting: *Deleted

## 2020-01-24 DIAGNOSIS — F4323 Adjustment disorder with mixed anxiety and depressed mood: Secondary | ICD-10-CM | POA: Diagnosis not present

## 2020-01-24 DIAGNOSIS — F330 Major depressive disorder, recurrent, mild: Secondary | ICD-10-CM | POA: Diagnosis not present

## 2020-01-24 MED FILL — valACYclovir HCL 1 GM TABS: 1 | 5 days supply | Qty: 10 | Fill #1

## 2020-01-30 MED FILL — valACYclovir HCL 1 GM TABS: 1 | 5 days supply | Qty: 10 | Fill #0

## 2020-01-30 MED FILL — METOPROLOL TARTRATE 25 MG T: 25 | 1 days supply | Qty: 1 | Fill #0

## 2020-01-31 ENCOUNTER — Other Ambulatory Visit (HOSPITAL_COMMUNITY): Payer: Self-pay | Admitting: Family Medicine

## 2020-02-01 MED FILL — VALACYCLOVIR HCL 500 MG TAB: 500 | 30 days supply | Qty: 30 | Fill #0

## 2020-02-06 MED FILL — SERTRALINE HCL 100 MG TAB: 100 | 30 days supply | Qty: 46 | Fill #0

## 2020-02-07 DIAGNOSIS — H5213 Myopia, bilateral: Secondary | ICD-10-CM | POA: Diagnosis not present

## 2020-02-07 DIAGNOSIS — D3131 Benign neoplasm of right choroid: Secondary | ICD-10-CM | POA: Diagnosis not present

## 2020-02-15 ENCOUNTER — Ambulatory Visit: Payer: 59 | Admitting: Cardiology

## 2020-02-18 MED FILL — ZOLPIDEM TARTRATE 5 MG TAB: 5 | 30 days supply | Qty: 30 | Fill #3

## 2020-02-27 MED FILL — VALACYCLOVIR HCL 500 MG TAB: 500 | 30 days supply | Qty: 30 | Fill #1

## 2020-03-02 MED FILL — SERTRALINE HCL 100 MG TAB: 100 | 30 days supply | Qty: 46 | Fill #1

## 2020-03-06 DIAGNOSIS — Z6831 Body mass index (BMI) 31.0-31.9, adult: Secondary | ICD-10-CM | POA: Diagnosis not present

## 2020-03-06 DIAGNOSIS — M797 Fibromyalgia: Secondary | ICD-10-CM | POA: Diagnosis not present

## 2020-03-06 DIAGNOSIS — M150 Primary generalized (osteo)arthritis: Secondary | ICD-10-CM | POA: Diagnosis not present

## 2020-03-06 DIAGNOSIS — M2550 Pain in unspecified joint: Secondary | ICD-10-CM | POA: Diagnosis not present

## 2020-03-06 DIAGNOSIS — M7061 Trochanteric bursitis, right hip: Secondary | ICD-10-CM | POA: Diagnosis not present

## 2020-03-06 DIAGNOSIS — E669 Obesity, unspecified: Secondary | ICD-10-CM | POA: Diagnosis not present

## 2020-03-06 DIAGNOSIS — Z79899 Other long term (current) drug therapy: Secondary | ICD-10-CM | POA: Diagnosis not present

## 2020-03-06 MED FILL — GABAPENTIN 100 MG CAPSULE: 100 | 30 days supply | Qty: 90 | Fill #0

## 2020-03-13 ENCOUNTER — Ambulatory Visit: Payer: 59 | Admitting: Cardiology

## 2020-03-13 MED FILL — METHOCARBAMOL 500 MG TABS: 500 | 30 days supply | Qty: 90 | Fill #0

## 2020-03-19 MED FILL — ZOLPIDEM TARTRATE 5 MG TAB: 5 | 30 days supply | Qty: 30 | Fill #0

## 2020-03-26 MED FILL — VALACYCLOVIR HCL 500 MG TAB: 500 | 30 days supply | Qty: 30 | Fill #2

## 2020-03-30 MED FILL — GABAPENTIN 100 MG CAPSULE: 100 | 30 days supply | Qty: 90 | Fill #1

## 2020-03-31 MED FILL — SERTRALINE HCL 100 MG TAB: 100 | 30 days supply | Qty: 46 | Fill #2

## 2020-04-03 DIAGNOSIS — F331 Major depressive disorder, recurrent, moderate: Secondary | ICD-10-CM | POA: Diagnosis not present

## 2020-04-03 DIAGNOSIS — F4323 Adjustment disorder with mixed anxiety and depressed mood: Secondary | ICD-10-CM | POA: Diagnosis not present

## 2020-04-03 DIAGNOSIS — Z79891 Long term (current) use of opiate analgesic: Secondary | ICD-10-CM | POA: Diagnosis not present

## 2020-04-03 DIAGNOSIS — F411 Generalized anxiety disorder: Secondary | ICD-10-CM | POA: Diagnosis not present

## 2020-04-03 DIAGNOSIS — F330 Major depressive disorder, recurrent, mild: Secondary | ICD-10-CM | POA: Diagnosis not present

## 2020-04-04 MED FILL — PANTOPRAZOLE SOD DR 40 MG T: 40 | 90 days supply | Qty: 180 | Fill #1

## 2020-04-05 DIAGNOSIS — H43811 Vitreous degeneration, right eye: Secondary | ICD-10-CM | POA: Diagnosis not present

## 2020-04-05 DIAGNOSIS — H4311 Vitreous hemorrhage, right eye: Secondary | ICD-10-CM | POA: Diagnosis not present

## 2020-04-05 DIAGNOSIS — H5213 Myopia, bilateral: Secondary | ICD-10-CM | POA: Diagnosis not present

## 2020-04-05 DIAGNOSIS — D3131 Benign neoplasm of right choroid: Secondary | ICD-10-CM | POA: Diagnosis not present

## 2020-04-05 MED FILL — DICLOFENAC SODIUM 75 MG TAB: 75 | 30 days supply | Qty: 60 | Fill #2

## 2020-04-06 MED FILL — METHOCARBAMOL 500 MG TABS: 500 | 30 days supply | Qty: 90 | Fill #1

## 2020-04-11 MED FILL — DESVENLAFAXINE SUC ER 50 MG: 50 | 30 days supply | Qty: 30 | Fill #0

## 2020-04-25 MED FILL — VALACYCLOVIR HCL 500 MG TAB: 500 | 30 days supply | Qty: 30 | Fill #3

## 2020-04-30 MED FILL — SERTRALINE HCL 100 MG TAB: 100 | 30 days supply | Qty: 45 | Fill #0

## 2020-04-30 MED FILL — GABAPENTIN 100 MG CAPSULE: 100 | 30 days supply | Qty: 90 | Fill #2

## 2020-05-04 ENCOUNTER — Other Ambulatory Visit: Payer: Self-pay | Admitting: Family Medicine

## 2020-05-04 DIAGNOSIS — Z1231 Encounter for screening mammogram for malignant neoplasm of breast: Secondary | ICD-10-CM

## 2020-05-11 MED FILL — DESVENLAFAXINE SUC ER 50 MG: 50 | 30 days supply | Qty: 30 | Fill #0

## 2020-05-25 MED FILL — VALACYCLOVIR HCL 500 MG TAB: 500 | 30 days supply | Qty: 30 | Fill #0

## 2020-05-28 MED FILL — DICLOFENAC SODIUM 75 MG TAB: 75 | 30 days supply | Qty: 60 | Fill #3

## 2020-05-28 MED FILL — DESVENLAFAXINE SUC ER 100 M: 100 | 30 days supply | Qty: 30 | Fill #0

## 2020-05-29 MED FILL — GABAPENTIN 100 MG CAPSULE: 100 | 30 days supply | Qty: 90 | Fill #3

## 2020-06-04 ENCOUNTER — Other Ambulatory Visit: Payer: Self-pay

## 2020-06-04 ENCOUNTER — Ambulatory Visit
Admission: RE | Admit: 2020-06-04 | Discharge: 2020-06-04 | Disposition: A | Discharge status: Home / Self Care | Payer: 59 | Source: Ambulatory Visit | Attending: Family Medicine | Admitting: Family Medicine

## 2020-06-04 DIAGNOSIS — Z1231 Encounter for screening mammogram for malignant neoplasm of breast: Secondary | ICD-10-CM

## 2020-06-19 MED FILL — traMADol HCL 50 MG TABS: 50 | 30 days supply | Qty: 60 | Fill #0

## 2020-06-19 MED FILL — ZOLPIDEM TARTRATE 5 MG TAB: 5 | 30 days supply | Qty: 30 | Fill #3

## 2020-06-27 MED FILL — VALACYCLOVIR HCL 500 MG TAB: 500 | 30 days supply | Qty: 30 | Fill #0

## 2020-06-27 MED FILL — DESVENLAFAXINE SUC ER 100 M: 100 | 30 days supply | Qty: 30 | Fill #1

## 2020-07-28 MED FILL — DICLOFENAC SODIUM 75 MG TAB: 75 | 30 days supply | Qty: 60 | Fill #4

## 2020-07-28 MED FILL — valACYclovir HCL 1 GM TABS: 1 | 5 days supply | Qty: 10 | Fill #2

## 2020-07-28 MED FILL — VALACYCLOVIR HCL 500 MG TAB: 500 | 30 days supply | Qty: 30 | Fill #1

## 2020-07-28 MED FILL — ZOLPIDEM TARTRATE 5 MG TAB: 5 | 30 days supply | Qty: 30 | Fill #4

## 2020-07-30 MED FILL — DESVENLAFAXINE SUC ER 100 M: 100 | 30 days supply | Qty: 30 | Fill #0

## 2020-07-30 MED FILL — ZOLPIDEM TARTRATE 5 MG TABS: 5 | 30 days supply | Qty: 30 | Fill #4

## 2020-08-06 ENCOUNTER — Other Ambulatory Visit (HOSPITAL_COMMUNITY): Payer: Self-pay | Admitting: Psychology

## 2020-08-06 MED FILL — buPROPion HCL ER (XL) 150 M: 150 | 30 days supply | Qty: 30 | Fill #0

## 2020-08-27 ENCOUNTER — Other Ambulatory Visit (HOSPITAL_COMMUNITY): Payer: Self-pay | Admitting: Family Medicine

## 2020-08-27 MED FILL — DESVENLAFAXINE SUC ER 100 M: 100 | 30 days supply | Qty: 30 | Fill #1

## 2020-08-27 MED FILL — VALACYCLOVIR HCL 500 MG TAB: 500 | 30 days supply | Qty: 30 | Fill #2

## 2020-08-27 MED FILL — ZOLPIDEM TARTRATE 5 MG TABS: 5 | 30 days supply | Qty: 30 | Fill #0

## 2020-09-06 ENCOUNTER — Other Ambulatory Visit (HOSPITAL_COMMUNITY): Payer: Self-pay | Admitting: Psychology

## 2020-09-07 MED FILL — buPROPion HCL ER (XL) 150 M: 150 | 90 days supply | Qty: 90 | Fill #0

## 2020-09-25 MED FILL — ZOLPIDEM TARTRATE 5 MG TABS: 5 | 30 days supply | Qty: 30 | Fill #1

## 2020-10-01 ENCOUNTER — Other Ambulatory Visit (HOSPITAL_COMMUNITY): Payer: Self-pay | Admitting: Family Medicine

## 2020-10-01 MED FILL — VALACYCLOVIR HCL 500 MG TAB: 500 | 30 days supply | Qty: 30 | Fill #0

## 2020-10-01 MED FILL — DESVENLAFAXINE SUC ER 100 M: 100 | 90 days supply | Qty: 90 | Fill #0

## 2020-10-29 MED FILL — VALACYCLOVIR HCL 500 MG TAB: 500 | 30 days supply | Qty: 30 | Fill #1

## 2020-10-29 MED FILL — ZOLPIDEM TARTRATE 5 MG TABS: 5 | 30 days supply | Qty: 30 | Fill #2

## 2020-11-07 ENCOUNTER — Other Ambulatory Visit (HOSPITAL_COMMUNITY): Payer: Self-pay | Admitting: Family Medicine

## 2020-11-07 MED FILL — TRIAMCINOLONE 0.5% CREAM: 0.5 | 30 days supply | Qty: 60 | Fill #0

## 2020-11-26 MED FILL — ZOLPIDEM TARTRATE 5 MG TABS: 5 | 30 days supply | Qty: 30 | Fill #3

## 2020-11-26 MED FILL — VALACYCLOVIR HCL 500 MG TAB: 500 | 7 days supply | Qty: 15 | Fill #0

## 2020-11-28 ENCOUNTER — Other Ambulatory Visit (HOSPITAL_COMMUNITY): Payer: Self-pay | Admitting: Psychology

## 2020-11-28 MED FILL — buPROPion HCL ER (XL) 150 M: 150 | 30 days supply | Qty: 30 | Fill #1

## 2020-12-05 ENCOUNTER — Other Ambulatory Visit (HOSPITAL_COMMUNITY): Payer: Self-pay | Admitting: Psychology

## 2020-12-17 ENCOUNTER — Other Ambulatory Visit (HOSPITAL_COMMUNITY): Payer: Self-pay | Admitting: Rheumatology

## 2020-12-17 MED FILL — METHOCARBAMOL 500 MG TABLET: 500 | 30 days supply | Qty: 90 | Fill #0

## 2020-12-17 MED FILL — VALACYCLOVIR HCL 500 MG TAB: 500 | 7 days supply | Qty: 15 | Fill #1

## 2020-12-20 ENCOUNTER — Ambulatory Visit (INDEPENDENT_AMBULATORY_CARE_PROVIDER_SITE_OTHER): Payer: Managed Care, Other (non HMO)

## 2020-12-20 ENCOUNTER — Other Ambulatory Visit: Payer: Self-pay

## 2020-12-20 ENCOUNTER — Ambulatory Visit: Payer: 59 | Admitting: Podiatry

## 2020-12-20 ENCOUNTER — Other Ambulatory Visit: Payer: Self-pay | Admitting: Podiatry

## 2020-12-20 DIAGNOSIS — M778 Other enthesopathies, not elsewhere classified: Secondary | ICD-10-CM

## 2020-12-20 DIAGNOSIS — B351 Tinea unguium: Secondary | ICD-10-CM

## 2020-12-20 DIAGNOSIS — M79672 Pain in left foot: Secondary | ICD-10-CM | POA: Diagnosis not present

## 2020-12-20 MED ORDER — TRIAMCINOLONE ACETONIDE 10 MG/ML IJ SUSP
10.0000 mg | Freq: Once | INTRAMUSCULAR | Status: AC
  Administered 2020-12-20: 10 mg

## 2020-12-24 NOTE — Progress Notes (Signed)
Subjective:   Patient ID: Priscilla Underwood, female   DOB: 60 y.o.   MRN: [id number]   HPI Patient presents stating she has had a lot of pain on top of her left foot and also states that her nails become thickened big toenail second nail left over right foot.  Patient states this has been ongoing and the pain has been intensifying over the last couple months.  She does not smoke and would like to be more active   Review of Systems  All other systems reviewed and are negative.       Objective:  Physical Exam Vitals and nursing note reviewed.  Constitutional:      Appearance: She is well-developed and well-nourished.  Cardiovascular:     Pulses: Intact distal pulses.  Pulmonary:     Effort: Pulmonary effort is normal.  Musculoskeletal:        General: Normal range of motion.  Skin:    General: Skin is warm.  Neurological:     Mental Status: She is alert.     Neurovascular status was found to be intact muscle strength was found to be adequate range of motion adequate.  Patient is found to have exquisite discomfort on the dorsum of the left foot around the extensor tendon complex that is sore when palpated and makes wearing shoe gear difficult.  Patient is found to have thickened dystrophic nailbeds hallux second left hallux right and has good digital perfusion well oriented x3     Assessment:  Probability for extensor tendinitis left along with thick mycotic nail infection with probable trauma     Plan:  H&P reviewed both conditions and injected the extensor complex left 3 mg dexamethasone Kenalog 5 mg Xylocaine and went ahead today and discussed nails do not recommend further treatment currently

## 2020-12-26 MED FILL — ZOLPIDEM TARTRATE 5 MG TABS: 5 | 30 days supply | Qty: 30 | Fill #4

## 2020-12-31 ENCOUNTER — Other Ambulatory Visit (HOSPITAL_COMMUNITY): Payer: Self-pay | Admitting: Family Medicine

## 2020-12-31 MED FILL — VALACYCLOVIR HCL 500 MG TAB: 500 | 30 days supply | Qty: 30 | Fill #0

## 2020-12-31 MED FILL — buPROPion HCL ER (XL) 150 M: 150 | 90 days supply | Qty: 90 | Fill #0

## 2020-12-31 MED FILL — DESVENLAFAXINE SUC ER 100 M: 100 | 90 days supply | Qty: 90 | Fill #0

## 2021-01-23 ENCOUNTER — Other Ambulatory Visit (HOSPITAL_COMMUNITY): Payer: Self-pay | Admitting: Rheumatology

## 2021-01-23 MED FILL — DICLOFENAC SODIUM 75 MG TAB: 75 | 30 days supply | Qty: 60 | Fill #0

## 2021-01-29 ENCOUNTER — Other Ambulatory Visit (HOSPITAL_COMMUNITY): Payer: Self-pay | Admitting: Family Medicine

## 2021-01-29 MED FILL — ZOLPIDEM TARTRATE 5 MG TABS: 5 | 30 days supply | Qty: 30 | Fill #0

## 2021-02-01 MED FILL — VALACYCLOVIR HCL 500 MG TAB: 500 | 30 days supply | Qty: 30 | Fill #1

## 2021-03-01 ENCOUNTER — Other Ambulatory Visit (HOSPITAL_COMMUNITY): Payer: Self-pay | Admitting: Family Medicine

## 2021-03-01 MED FILL — VALACYCLOVIR HCL 500 MG TAB: 500 | 30 days supply | Qty: 30 | Fill #0

## 2021-03-21 ENCOUNTER — Other Ambulatory Visit (HOSPITAL_COMMUNITY): Payer: Self-pay

## 2021-03-21 MED ORDER — AMOXICILLIN-POT CLAVULANATE 875-125 MG PO TABS
ORAL_TABLET | ORAL | 0 refills | Status: DC
  Filled 2021-03-21: qty 14, 7d supply, fill #0

## 2021-03-21 MED ORDER — BENZONATATE 200 MG PO CAPS
ORAL_CAPSULE | ORAL | 0 refills | Status: DC
  Filled 2021-03-21: qty 21, 7d supply, fill #0

## 2021-03-21 MED ORDER — ALBUTEROL SULFATE HFA 108 (90 BASE) MCG/ACT IN AERS
INHALATION_SPRAY | RESPIRATORY_TRACT | 0 refills | Status: DC
  Filled 2021-03-21: qty 8.5, 25d supply, fill #0

## 2021-03-29 ENCOUNTER — Other Ambulatory Visit (HOSPITAL_COMMUNITY): Payer: Self-pay

## 2021-03-29 MED FILL — Diclofenac Sodium Tab Delayed Release 75 MG: ORAL | 30 days supply | Qty: 60 | Fill #0 | Status: AC

## 2021-03-29 MED FILL — Methocarbamol Tab 500 MG: ORAL | 30 days supply | Qty: 90 | Fill #0 | Status: AC

## 2021-03-29 MED FILL — Valacyclovir HCl Tab 500 MG: ORAL | 30 days supply | Qty: 30 | Fill #0 | Status: AC

## 2021-03-29 MED FILL — Zolpidem Tartrate Tab 5 MG: ORAL | 30 days supply | Qty: 30 | Fill #0 | Status: AC

## 2021-03-29 MED FILL — Bupropion HCl Tab ER 24HR 150 MG: ORAL | 90 days supply | Qty: 90 | Fill #0 | Status: AC

## 2021-04-02 ENCOUNTER — Other Ambulatory Visit (HOSPITAL_COMMUNITY): Payer: Self-pay

## 2021-04-02 MED ORDER — DESVENLAFAXINE SUCCINATE ER 100 MG PO TB24
100.0000 mg | ORAL_TABLET | Freq: Every day | ORAL | 2 refills | Status: DC
  Filled 2021-04-02: qty 30, 30d supply, fill #0
  Filled 2021-04-29: qty 30, 30d supply, fill #1
  Filled 2021-05-28: qty 30, 30d supply, fill #2

## 2021-04-29 ENCOUNTER — Other Ambulatory Visit (HOSPITAL_COMMUNITY): Payer: Self-pay

## 2021-04-29 MED ORDER — PANTOPRAZOLE SODIUM 40 MG PO TBEC
40.0000 mg | DELAYED_RELEASE_TABLET | Freq: Two times a day (BID) | ORAL | 1 refills | Status: DC
Start: 2020-07-03 — End: 2021-09-30
  Filled 2021-04-29: qty 180, 90d supply, fill #0

## 2021-05-03 ENCOUNTER — Other Ambulatory Visit (HOSPITAL_COMMUNITY): Payer: Self-pay

## 2021-05-03 MED ORDER — VALACYCLOVIR HCL 500 MG PO TABS
500.0000 mg | ORAL_TABLET | Freq: Every day | ORAL | 1 refills | Status: DC
  Filled 2021-05-03: qty 30, 30d supply, fill #0
  Filled 2021-05-28: qty 30, 30d supply, fill #1

## 2021-05-07 ENCOUNTER — Other Ambulatory Visit (HOSPITAL_COMMUNITY): Payer: Self-pay

## 2021-05-20 ENCOUNTER — Other Ambulatory Visit (HOSPITAL_COMMUNITY): Payer: Self-pay

## 2021-05-20 MED FILL — Zolpidem Tartrate Tab 5 MG: ORAL | 30 days supply | Qty: 30 | Fill #1 | Status: AC

## 2021-05-28 ENCOUNTER — Other Ambulatory Visit (HOSPITAL_COMMUNITY): Payer: Self-pay

## 2021-05-28 MED FILL — Diclofenac Sodium Tab Delayed Release 75 MG: ORAL | 30 days supply | Qty: 60 | Fill #1 | Status: AC

## 2021-05-28 MED FILL — Methocarbamol Tab 500 MG: ORAL | 30 days supply | Qty: 90 | Fill #1 | Status: AC

## 2021-06-11 ENCOUNTER — Other Ambulatory Visit: Payer: Self-pay | Admitting: Diagnostic Radiology

## 2021-06-11 ENCOUNTER — Other Ambulatory Visit: Payer: Self-pay | Admitting: Internal Medicine

## 2021-06-11 DIAGNOSIS — Z1231 Encounter for screening mammogram for malignant neoplasm of breast: Secondary | ICD-10-CM

## 2021-06-17 ENCOUNTER — Other Ambulatory Visit (HOSPITAL_COMMUNITY): Payer: Self-pay

## 2021-06-17 MED ORDER — DESVENLAFAXINE SUCCINATE ER 100 MG PO TB24
100.0000 mg | ORAL_TABLET | Freq: Every day | ORAL | 2 refills | Status: DC
  Filled 2021-06-17 – 2021-06-27 (×2): qty 90, 90d supply, fill #0
  Filled 2021-09-30: qty 90, 90d supply, fill #1
  Filled 2022-01-02: qty 90, 90d supply, fill #2

## 2021-06-17 MED ORDER — BUPROPION HCL ER (XL) 150 MG PO TB24
ORAL_TABLET | ORAL | 2 refills | Status: DC
  Filled 2021-06-17 – 2021-06-27 (×2): qty 90, 90d supply, fill #0
  Filled 2021-09-30: qty 90, 90d supply, fill #1
  Filled 2022-01-02: qty 90, 90d supply, fill #2

## 2021-06-24 DIAGNOSIS — F419 Anxiety disorder, unspecified: Secondary | ICD-10-CM | POA: Insufficient documentation

## 2021-06-24 DIAGNOSIS — F32A Depression, unspecified: Secondary | ICD-10-CM | POA: Insufficient documentation

## 2021-06-25 ENCOUNTER — Other Ambulatory Visit (HOSPITAL_COMMUNITY): Payer: Self-pay

## 2021-06-27 ENCOUNTER — Other Ambulatory Visit (HOSPITAL_COMMUNITY): Payer: Self-pay

## 2021-06-27 MED ORDER — VALACYCLOVIR HCL 500 MG PO TABS
500.0000 mg | ORAL_TABLET | Freq: Every day | ORAL | 1 refills | Status: DC
  Filled 2021-06-27: qty 30, 30d supply, fill #0
  Filled 2022-03-17: qty 30, 30d supply, fill #1

## 2021-07-22 ENCOUNTER — Other Ambulatory Visit (HOSPITAL_COMMUNITY): Payer: Self-pay

## 2021-07-22 MED ORDER — VALACYCLOVIR HCL 500 MG PO TABS
500.0000 mg | ORAL_TABLET | Freq: Every day | ORAL | 2 refills | Status: DC
  Filled 2021-07-22: qty 30, 30d supply, fill #0
  Filled 2021-08-26: qty 30, 30d supply, fill #1
  Filled 2021-09-30: qty 30, 30d supply, fill #2

## 2021-07-25 ENCOUNTER — Other Ambulatory Visit (HOSPITAL_COMMUNITY): Payer: Self-pay

## 2021-07-26 ENCOUNTER — Other Ambulatory Visit (HOSPITAL_COMMUNITY): Payer: Self-pay

## 2021-08-01 ENCOUNTER — Ambulatory Visit
Admission: RE | Admit: 2021-08-01 | Discharge: 2021-08-01 | Disposition: A | Discharge status: Home / Self Care | Payer: Managed Care, Other (non HMO) | Source: Ambulatory Visit | Attending: Diagnostic Radiology | Admitting: Diagnostic Radiology

## 2021-08-01 ENCOUNTER — Ambulatory Visit: Payer: Managed Care, Other (non HMO)

## 2021-08-01 ENCOUNTER — Other Ambulatory Visit: Payer: Self-pay

## 2021-08-01 DIAGNOSIS — Z1231 Encounter for screening mammogram for malignant neoplasm of breast: Secondary | ICD-10-CM

## 2021-08-04 MED FILL — Diclofenac Sodium Tab Delayed Release 75 MG: ORAL | 30 days supply | Qty: 60 | Fill #2 | Status: AC

## 2021-08-05 ENCOUNTER — Other Ambulatory Visit (HOSPITAL_COMMUNITY): Payer: Self-pay

## 2021-08-13 ENCOUNTER — Other Ambulatory Visit (HOSPITAL_COMMUNITY): Payer: Self-pay

## 2021-08-13 MED ORDER — AMOXICILLIN-POT CLAVULANATE 875-125 MG PO TABS
ORAL_TABLET | ORAL | 0 refills | Status: DC
Start: 2021-08-13 — End: 2022-09-10
  Filled 2021-08-13: qty 20, 10d supply, fill #0

## 2021-08-14 ENCOUNTER — Other Ambulatory Visit (HOSPITAL_COMMUNITY): Payer: Self-pay

## 2021-08-14 MED ORDER — ZOLPIDEM TARTRATE 5 MG PO TABS
ORAL_TABLET | ORAL | 2 refills | Status: DC
  Filled 2021-08-14: qty 30, 30d supply, fill #0
  Filled 2021-09-16: qty 30, 30d supply, fill #1
  Filled 2021-10-24: qty 30, 30d supply, fill #2

## 2021-08-16 ENCOUNTER — Other Ambulatory Visit (HOSPITAL_COMMUNITY): Payer: Self-pay

## 2021-08-26 ENCOUNTER — Other Ambulatory Visit (HOSPITAL_COMMUNITY): Payer: Self-pay

## 2021-08-27 ENCOUNTER — Other Ambulatory Visit (HOSPITAL_COMMUNITY): Payer: Self-pay

## 2021-09-16 ENCOUNTER — Other Ambulatory Visit (HOSPITAL_COMMUNITY): Payer: Self-pay

## 2021-09-30 ENCOUNTER — Other Ambulatory Visit (HOSPITAL_COMMUNITY): Payer: Self-pay

## 2021-09-30 MED ORDER — PANTOPRAZOLE SODIUM 40 MG PO TBEC
40.0000 mg | DELAYED_RELEASE_TABLET | Freq: Two times a day (BID) | ORAL | 0 refills | Status: DC
  Filled 2021-09-30: qty 180, 90d supply, fill #0

## 2021-10-03 ENCOUNTER — Other Ambulatory Visit (HOSPITAL_COMMUNITY): Payer: Self-pay

## 2021-10-03 MED ORDER — DICLOFENAC SODIUM 75 MG PO TBEC
DELAYED_RELEASE_TABLET | ORAL | 5 refills | Status: DC
  Filled 2021-10-03: qty 60, 30d supply, fill #0
  Filled 2021-11-22: qty 60, 30d supply, fill #1
  Filled 2022-02-17: qty 60, 30d supply, fill #2
  Filled 2022-04-18: qty 60, 30d supply, fill #3
  Filled 2022-07-07: qty 60, 30d supply, fill #4
  Filled 2022-10-03: qty 60, 30d supply, fill #5

## 2021-10-04 ENCOUNTER — Other Ambulatory Visit (HOSPITAL_COMMUNITY): Payer: Self-pay

## 2021-10-04 MED ORDER — MUPIROCIN 2 % EX OINT
TOPICAL_OINTMENT | CUTANEOUS | 0 refills | Status: DC
  Filled 2021-10-04: qty 22, 7d supply, fill #0

## 2021-10-04 MED ORDER — SULFAMETHOXAZOLE-TRIMETHOPRIM 800-160 MG PO TABS
ORAL_TABLET | ORAL | 0 refills | Status: DC
  Filled 2021-10-04: qty 28, 14d supply, fill #0

## 2021-10-24 ENCOUNTER — Other Ambulatory Visit (HOSPITAL_COMMUNITY): Payer: Self-pay

## 2021-10-28 ENCOUNTER — Other Ambulatory Visit (HOSPITAL_COMMUNITY): Payer: Self-pay

## 2021-10-28 MED ORDER — VALACYCLOVIR HCL 500 MG PO TABS
500.0000 mg | ORAL_TABLET | Freq: Every day | ORAL | 2 refills | Status: DC
  Filled 2021-10-28: qty 30, 30d supply, fill #0
  Filled 2021-12-15: qty 30, 30d supply, fill #1
  Filled 2022-02-14: qty 30, 30d supply, fill #2

## 2021-11-05 ENCOUNTER — Other Ambulatory Visit (HOSPITAL_COMMUNITY): Payer: Self-pay

## 2021-11-22 ENCOUNTER — Other Ambulatory Visit (HOSPITAL_COMMUNITY): Payer: Self-pay

## 2021-12-04 ENCOUNTER — Other Ambulatory Visit (HOSPITAL_COMMUNITY): Payer: Self-pay

## 2021-12-04 MED ORDER — ZOLPIDEM TARTRATE 5 MG PO TABS
ORAL_TABLET | ORAL | 0 refills | Status: DC
  Filled 2021-12-04: qty 30, 30d supply, fill #0

## 2021-12-09 ENCOUNTER — Other Ambulatory Visit (HOSPITAL_COMMUNITY): Payer: Self-pay

## 2021-12-16 ENCOUNTER — Other Ambulatory Visit (HOSPITAL_COMMUNITY): Payer: Self-pay

## 2022-01-02 ENCOUNTER — Other Ambulatory Visit (HOSPITAL_COMMUNITY): Payer: Self-pay

## 2022-01-02 MED ORDER — ZOLPIDEM TARTRATE 5 MG PO TABS
ORAL_TABLET | ORAL | 0 refills | Status: DC
  Filled 2022-01-02: qty 30, 30d supply, fill #0

## 2022-01-09 ENCOUNTER — Other Ambulatory Visit (HOSPITAL_COMMUNITY): Payer: Self-pay

## 2022-01-09 MED ORDER — AMOXICILLIN-POT CLAVULANATE 875-125 MG PO TABS
ORAL_TABLET | ORAL | 0 refills | Status: DC
  Filled 2022-01-09: qty 20, 10d supply, fill #0

## 2022-01-23 ENCOUNTER — Other Ambulatory Visit (HOSPITAL_COMMUNITY): Payer: Self-pay

## 2022-01-23 MED ORDER — PROMETHAZINE-DM 6.25-15 MG/5ML PO SYRP
ORAL_SOLUTION | ORAL | 0 refills | Status: DC
  Filled 2022-01-23: qty 200, 10d supply, fill #0

## 2022-02-06 ENCOUNTER — Other Ambulatory Visit: Payer: Self-pay | Admitting: Family Medicine

## 2022-02-06 DIAGNOSIS — Z1231 Encounter for screening mammogram for malignant neoplasm of breast: Secondary | ICD-10-CM

## 2022-02-14 ENCOUNTER — Other Ambulatory Visit (HOSPITAL_COMMUNITY): Payer: Self-pay

## 2022-02-17 ENCOUNTER — Other Ambulatory Visit (HOSPITAL_COMMUNITY): Payer: Self-pay

## 2022-02-19 ENCOUNTER — Other Ambulatory Visit (HOSPITAL_COMMUNITY): Payer: Self-pay

## 2022-02-26 ENCOUNTER — Other Ambulatory Visit (HOSPITAL_COMMUNITY): Payer: Self-pay

## 2022-03-03 ENCOUNTER — Other Ambulatory Visit (HOSPITAL_COMMUNITY): Payer: Self-pay

## 2022-03-04 ENCOUNTER — Other Ambulatory Visit (HOSPITAL_COMMUNITY): Payer: Self-pay

## 2022-03-04 MED ORDER — TRAMADOL HCL 50 MG PO TABS
ORAL_TABLET | ORAL | 0 refills | Status: DC
  Filled 2022-03-04: qty 15, 30d supply, fill #0

## 2022-03-07 ENCOUNTER — Other Ambulatory Visit (HOSPITAL_COMMUNITY): Payer: Self-pay

## 2022-03-07 MED ORDER — ZOLPIDEM TARTRATE 5 MG PO TABS
ORAL_TABLET | ORAL | 3 refills | Status: DC
  Filled 2022-03-07: qty 30, 30d supply, fill #0
  Filled 2022-04-06: qty 30, 30d supply, fill #1
  Filled 2022-05-06: qty 30, 30d supply, fill #2
  Filled 2022-06-15: qty 30, 30d supply, fill #3

## 2022-03-14 ENCOUNTER — Other Ambulatory Visit (HOSPITAL_COMMUNITY): Payer: Self-pay

## 2022-03-17 ENCOUNTER — Other Ambulatory Visit (HOSPITAL_COMMUNITY): Payer: Self-pay

## 2022-03-18 ENCOUNTER — Other Ambulatory Visit (HOSPITAL_COMMUNITY): Payer: Self-pay

## 2022-03-18 MED ORDER — BUPROPION HCL ER (XL) 150 MG PO TB24
ORAL_TABLET | ORAL | 1 refills | Status: DC
  Filled 2022-03-18: qty 90, 90d supply, fill #0
  Filled 2022-07-07: qty 90, 90d supply, fill #1

## 2022-03-18 MED ORDER — DESVENLAFAXINE SUCCINATE ER 50 MG PO TB24
ORAL_TABLET | Freq: Every day | ORAL | 1 refills | Status: DC
  Filled 2022-03-18: qty 30, 30d supply, fill #0
  Filled 2022-04-18: qty 30, 30d supply, fill #1

## 2022-03-21 ENCOUNTER — Other Ambulatory Visit (HOSPITAL_COMMUNITY): Payer: Self-pay

## 2022-04-07 ENCOUNTER — Other Ambulatory Visit (HOSPITAL_COMMUNITY): Payer: Self-pay

## 2022-04-18 ENCOUNTER — Other Ambulatory Visit (HOSPITAL_COMMUNITY): Payer: Self-pay

## 2022-04-18 MED ORDER — VALACYCLOVIR HCL 500 MG PO TABS
500.0000 mg | ORAL_TABLET | Freq: Every day | ORAL | 1 refills | Status: DC
  Filled 2022-04-18: qty 30, 30d supply, fill #0
  Filled 2022-07-17: qty 30, 30d supply, fill #1

## 2022-04-21 ENCOUNTER — Other Ambulatory Visit (HOSPITAL_COMMUNITY): Payer: Self-pay

## 2022-04-21 MED ORDER — DESVENLAFAXINE SUCCINATE ER 100 MG PO TB24
ORAL_TABLET | Freq: Every day | ORAL | 1 refills | Status: DC
Start: 2022-04-21 — End: 2022-09-10
  Filled 2022-04-21: qty 90, 90d supply, fill #0
  Filled 2022-08-23: qty 90, 90d supply, fill #1

## 2022-04-22 ENCOUNTER — Other Ambulatory Visit (HOSPITAL_COMMUNITY): Payer: Self-pay

## 2022-04-22 MED ORDER — PANTOPRAZOLE SODIUM 40 MG PO TBEC
40.0000 mg | DELAYED_RELEASE_TABLET | Freq: Two times a day (BID) | ORAL | 1 refills | Status: DC
  Filled 2022-04-22: qty 180, 90d supply, fill #0
  Filled 2022-10-03: qty 180, 90d supply, fill #1

## 2022-04-23 ENCOUNTER — Other Ambulatory Visit (HOSPITAL_COMMUNITY): Payer: Self-pay

## 2022-05-06 ENCOUNTER — Other Ambulatory Visit (HOSPITAL_COMMUNITY): Payer: Self-pay

## 2022-06-16 ENCOUNTER — Other Ambulatory Visit (HOSPITAL_COMMUNITY): Payer: Self-pay

## 2022-06-19 ENCOUNTER — Other Ambulatory Visit (HOSPITAL_COMMUNITY): Payer: Self-pay

## 2022-06-19 MED ORDER — BUPROPION HCL ER (XL) 150 MG PO TB24
ORAL_TABLET | ORAL | 1 refills | Status: DC
  Filled 2022-06-19: qty 90, 90d supply, fill #0

## 2022-06-19 MED ORDER — DESVENLAFAXINE SUCCINATE ER 100 MG PO TB24
100.0000 mg | ORAL_TABLET | Freq: Every day | ORAL | 1 refills | Status: DC
  Filled 2022-06-19 – 2022-11-05 (×3): qty 90, 90d supply, fill #0
  Filled 2023-02-09: qty 30, 30d supply, fill #1
  Filled 2023-03-23: qty 30, 30d supply, fill #2
  Filled 2023-04-18: qty 30, 30d supply, fill #3

## 2022-06-27 ENCOUNTER — Other Ambulatory Visit (HOSPITAL_COMMUNITY): Payer: Self-pay

## 2022-06-28 ENCOUNTER — Other Ambulatory Visit (HOSPITAL_COMMUNITY): Payer: Self-pay

## 2022-06-30 ENCOUNTER — Other Ambulatory Visit (HOSPITAL_COMMUNITY): Payer: Self-pay

## 2022-06-30 MED ORDER — TRAMADOL HCL 50 MG PO TABS
ORAL_TABLET | ORAL | 0 refills | Status: DC
  Filled 2022-06-30: qty 15, 15d supply, fill #0

## 2022-07-08 ENCOUNTER — Other Ambulatory Visit (HOSPITAL_COMMUNITY): Payer: Self-pay

## 2022-07-17 ENCOUNTER — Other Ambulatory Visit (HOSPITAL_COMMUNITY): Payer: Self-pay

## 2022-07-18 ENCOUNTER — Other Ambulatory Visit (HOSPITAL_COMMUNITY): Payer: Self-pay

## 2022-07-18 MED ORDER — ZOLPIDEM TARTRATE 5 MG PO TABS
ORAL_TABLET | ORAL | 0 refills | Status: DC
  Filled 2022-07-18: qty 30, 30d supply, fill #0

## 2022-08-04 ENCOUNTER — Ambulatory Visit
Admission: RE | Admit: 2022-08-04 | Discharge: 2022-08-04 | Disposition: A | Discharge status: Home / Self Care | Payer: Managed Care, Other (non HMO) | Source: Ambulatory Visit | Attending: Family Medicine | Admitting: Family Medicine

## 2022-08-04 DIAGNOSIS — Z1231 Encounter for screening mammogram for malignant neoplasm of breast: Secondary | ICD-10-CM

## 2022-08-07 ENCOUNTER — Other Ambulatory Visit: Payer: Self-pay | Admitting: Family Medicine

## 2022-08-07 DIAGNOSIS — R928 Other abnormal and inconclusive findings on diagnostic imaging of breast: Secondary | ICD-10-CM

## 2022-08-23 ENCOUNTER — Other Ambulatory Visit (HOSPITAL_COMMUNITY): Payer: Self-pay

## 2022-08-25 ENCOUNTER — Ambulatory Visit
Admission: RE | Admit: 2022-08-25 | Discharge: 2022-08-25 | Disposition: A | Discharge status: Home / Self Care | Payer: Managed Care, Other (non HMO) | Source: Ambulatory Visit | Attending: Family Medicine | Admitting: Family Medicine

## 2022-08-25 ENCOUNTER — Ambulatory Visit: Payer: Managed Care, Other (non HMO)

## 2022-08-25 DIAGNOSIS — R928 Other abnormal and inconclusive findings on diagnostic imaging of breast: Secondary | ICD-10-CM

## 2022-09-08 ENCOUNTER — Other Ambulatory Visit (HOSPITAL_COMMUNITY): Payer: Self-pay

## 2022-09-08 MED ORDER — DOXYCYCLINE MONOHYDRATE 100 MG PO TABS
ORAL_TABLET | ORAL | 0 refills | Status: DC
  Filled 2022-09-08: qty 20, 10d supply, fill #0

## 2022-09-08 MED ORDER — PROMETHAZINE-DM 6.25-15 MG/5ML PO SYRP
ORAL_SOLUTION | ORAL | 0 refills | Status: DC
  Filled 2022-09-08: qty 100, 4d supply, fill #0
  Filled 2022-09-08: qty 60, 3d supply, fill #0

## 2022-09-08 MED ORDER — AZELASTINE HCL 0.15 % NA SOLN
NASAL | 1 refills | Status: DC
  Filled 2022-09-08: qty 30, 50d supply, fill #0

## 2022-09-09 ENCOUNTER — Other Ambulatory Visit (HOSPITAL_COMMUNITY): Payer: Self-pay

## 2022-09-09 MED ORDER — ZOLPIDEM TARTRATE 5 MG PO TABS
5.0000 mg | ORAL_TABLET | Freq: Every evening | ORAL | 2 refills | Status: DC
  Filled 2022-09-09: qty 30, 30d supply, fill #0

## 2022-09-10 ENCOUNTER — Ambulatory Visit: Payer: Managed Care, Other (non HMO) | Admitting: Podiatry

## 2022-09-10 ENCOUNTER — Other Ambulatory Visit (HOSPITAL_COMMUNITY): Payer: Self-pay

## 2022-09-10 DIAGNOSIS — B351 Tinea unguium: Secondary | ICD-10-CM

## 2022-09-10 MED ORDER — TERBINAFINE HCL 250 MG PO TABS
ORAL_TABLET | ORAL | 0 refills | Status: DC
  Filled 2022-09-10: qty 21, 84d supply, fill #0

## 2022-09-10 NOTE — Progress Notes (Signed)
Subjective:   Patient ID: Priscilla Underwood, female   DOB: 61 y.o.   MRN: [id number]   HPI Patient presents with a small black spot underneath her left second toe of several years duration and a thickened deformed hallux nail left that has yellow like discoloration and mild discomfort medial side   ROS      Objective:  Physical Exam  Neurovascular status intact with small black spot in the proximal portion of the nailbed second left not connected to any underlying pathology or tissue with a thick yellow nailbed right hallux distal two thirds with a small split in the nail      Assessment:  Probability that the small spot is a trauma spot as its not connected anything else only been present several months and I do not recommend pathology currently but if it were to continue may need to be done at 1 point future.  I went ahead today and I have recommended that there also is a fungal infection along with probable trauma to the right big toe      Plan:  H&P all conditions reviewed and I do think a combination of pulse Lamisil with laser would be best for the big toenail and she wants to have this done and patient is scheduled for laser treatment x3 and I started her on pulse antifungal therapy.  If the nail were to get worse or any issues were to occur we could remove it and send for pathology but I think that is overly aggressive at the current time

## 2022-09-11 ENCOUNTER — Other Ambulatory Visit (HOSPITAL_COMMUNITY): Payer: Self-pay

## 2022-09-11 ENCOUNTER — Other Ambulatory Visit: Payer: Self-pay

## 2022-09-12 ENCOUNTER — Other Ambulatory Visit (HOSPITAL_COMMUNITY): Payer: Self-pay

## 2022-09-13 ENCOUNTER — Other Ambulatory Visit (HOSPITAL_COMMUNITY): Payer: Self-pay

## 2022-09-15 ENCOUNTER — Other Ambulatory Visit (HOSPITAL_COMMUNITY): Payer: Self-pay

## 2022-09-15 MED ORDER — ZOLPIDEM TARTRATE 5 MG PO TABS
5.0000 mg | ORAL_TABLET | Freq: Every evening | ORAL | 2 refills | Status: DC | PRN
  Filled 2022-09-15: qty 30, 30d supply, fill #0
  Filled 2022-10-20: qty 30, 30d supply, fill #1
  Filled 2022-11-23: qty 30, 30d supply, fill #2

## 2022-09-23 ENCOUNTER — Ambulatory Visit: Payer: Managed Care, Other (non HMO) | Admitting: Gastroenterology

## 2022-10-01 ENCOUNTER — Other Ambulatory Visit: Payer: Managed Care, Other (non HMO)

## 2022-10-03 ENCOUNTER — Ambulatory Visit (INDEPENDENT_AMBULATORY_CARE_PROVIDER_SITE_OTHER): Payer: Managed Care, Other (non HMO)

## 2022-10-03 ENCOUNTER — Ambulatory Visit: Payer: Managed Care, Other (non HMO) | Attending: Family Medicine

## 2022-10-03 ENCOUNTER — Other Ambulatory Visit (HOSPITAL_COMMUNITY): Payer: Self-pay

## 2022-10-03 DIAGNOSIS — B351 Tinea unguium: Secondary | ICD-10-CM

## 2022-10-03 DIAGNOSIS — Z23 Encounter for immunization: Secondary | ICD-10-CM

## 2022-10-03 NOTE — Patient Instructions (Signed)

## 2022-10-03 NOTE — Progress Notes (Signed)
Patient presents today for the 1st laser treatment. Diagnosed with mycotic nail infection by Dr. Paulla Dolly.   Toenail most affected left 1st and 2nd.  All other systems are negative.  Nails were filed thin. Laser therapy was administered to 1st and 2nd toenails left and patient tolerated the treatment well. All safety precautions were in place.     Follow up in 4 weeks for laser # 2.  Patient reports talking Lamisil on a pulse dose schedule as directed.

## 2022-10-03 NOTE — Progress Notes (Signed)
Patient received the FLU vaccine in left arm. Patient tolerated well.

## 2022-10-06 ENCOUNTER — Other Ambulatory Visit (HOSPITAL_COMMUNITY): Payer: Self-pay

## 2022-10-06 MED ORDER — BUPROPION HCL ER (XL) 150 MG PO TB24
150.0000 mg | ORAL_TABLET | Freq: Every morning | ORAL | 1 refills | Status: DC
  Filled 2022-10-06: qty 90, 90d supply, fill #0
  Filled 2023-08-14: qty 90, 90d supply, fill #1

## 2022-10-20 ENCOUNTER — Other Ambulatory Visit (HOSPITAL_COMMUNITY): Payer: Self-pay

## 2022-10-28 ENCOUNTER — Other Ambulatory Visit (HOSPITAL_COMMUNITY): Payer: Self-pay

## 2022-10-29 ENCOUNTER — Other Ambulatory Visit (HOSPITAL_COMMUNITY): Payer: Self-pay

## 2022-11-05 ENCOUNTER — Ambulatory Visit (INDEPENDENT_AMBULATORY_CARE_PROVIDER_SITE_OTHER): Payer: Managed Care, Other (non HMO) | Admitting: Podiatry

## 2022-11-05 ENCOUNTER — Other Ambulatory Visit (HOSPITAL_COMMUNITY): Payer: Self-pay

## 2022-11-05 DIAGNOSIS — B351 Tinea unguium: Secondary | ICD-10-CM

## 2022-11-05 NOTE — Progress Notes (Signed)
Patient presents today for the 2nd laser treatment. Diagnosed with mycotic nail infection by Dr. Paulla Dolly.    Toenail most affected left 1st and 2nd.   All other systems are negative.   Nails were filed thin. Laser therapy was administered to 1st and 2nd toenails left and patient tolerated the treatment well. All safety precautions were in place.       Follow up in 4 weeks for laser # 3.   Patient reports talking Lamisil on a pulse dose schedule as directed.

## 2022-11-17 ENCOUNTER — Other Ambulatory Visit (HOSPITAL_COMMUNITY): Payer: Self-pay

## 2022-11-17 MED ORDER — VALACYCLOVIR HCL 500 MG PO TABS
500.0000 mg | ORAL_TABLET | Freq: Every day | ORAL | 1 refills | Status: DC
Start: 2022-11-17 — End: 2023-01-26
  Filled 2022-11-17: qty 30, 30d supply, fill #0
  Filled 2022-12-25: qty 30, 30d supply, fill #1

## 2022-11-23 ENCOUNTER — Other Ambulatory Visit (HOSPITAL_COMMUNITY): Payer: Self-pay

## 2022-11-24 ENCOUNTER — Other Ambulatory Visit: Payer: Self-pay

## 2022-11-24 ENCOUNTER — Other Ambulatory Visit (HOSPITAL_COMMUNITY): Payer: Self-pay

## 2022-11-24 MED ORDER — DICLOFENAC SODIUM 75 MG PO TBEC
75.0000 mg | DELAYED_RELEASE_TABLET | Freq: Two times a day (BID) | ORAL | 3 refills | Status: DC | PRN
  Filled 2022-11-24: qty 60, 30d supply, fill #0
  Filled 2023-01-26: qty 60, 30d supply, fill #1
  Filled 2023-04-22: qty 60, 30d supply, fill #2

## 2022-11-26 ENCOUNTER — Other Ambulatory Visit (HOSPITAL_COMMUNITY): Payer: Self-pay

## 2022-12-03 ENCOUNTER — Other Ambulatory Visit: Payer: Managed Care, Other (non HMO)

## 2022-12-19 ENCOUNTER — Other Ambulatory Visit (HOSPITAL_COMMUNITY): Payer: Self-pay

## 2022-12-19 MED ORDER — AMOXICILLIN-POT CLAVULANATE 875-125 MG PO TABS
1.0000 | ORAL_TABLET | Freq: Two times a day (BID) | ORAL | 0 refills | Status: DC
  Filled 2022-12-19: qty 20, 10d supply, fill #0

## 2022-12-19 MED ORDER — AZELASTINE HCL 0.15 % NA SOLN
NASAL | 1 refills | Status: DC
Start: 2022-12-19 — End: 2024-03-17
  Filled 2022-12-19: qty 30, 30d supply, fill #0

## 2022-12-25 ENCOUNTER — Other Ambulatory Visit (HOSPITAL_COMMUNITY): Payer: Self-pay

## 2022-12-25 MED ORDER — AZITHROMYCIN 250 MG PO TABS
ORAL_TABLET | ORAL | 0 refills | Status: DC
  Filled 2022-12-25: qty 6, 5d supply, fill #0

## 2022-12-26 ENCOUNTER — Other Ambulatory Visit (HOSPITAL_COMMUNITY): Payer: Self-pay

## 2022-12-26 MED ORDER — ZOLPIDEM TARTRATE 5 MG PO TABS
5.0000 mg | ORAL_TABLET | Freq: Every evening | ORAL | 3 refills | Status: DC | PRN
  Filled 2022-12-26: qty 30, 30d supply, fill #0
  Filled 2023-01-26: qty 30, 30d supply, fill #1
  Filled 2023-03-09: qty 15, 25d supply, fill #2
  Filled 2023-04-01 – 2023-04-03 (×2): qty 15, 25d supply, fill #3
  Filled 2023-04-22: qty 15, 15d supply, fill #4
  Filled 2023-04-29: qty 15, 25d supply, fill #4
  Filled 2023-05-16: qty 15, 25d supply, fill #5

## 2022-12-29 ENCOUNTER — Other Ambulatory Visit (HOSPITAL_COMMUNITY): Payer: Self-pay

## 2022-12-29 MED ORDER — BUPROPION HCL ER (XL) 150 MG PO TB24
150.0000 mg | ORAL_TABLET | Freq: Every morning | ORAL | 2 refills | Status: DC
  Filled 2022-12-29: qty 90, 90d supply, fill #0
  Filled 2023-01-09: qty 30, 30d supply, fill #0
  Filled 2023-02-09: qty 30, 30d supply, fill #1
  Filled 2023-03-09: qty 30, 30d supply, fill #2
  Filled 2023-04-10: qty 30, 30d supply, fill #3
  Filled 2023-05-11: qty 30, 30d supply, fill #4
  Filled 2023-07-10 – 2023-07-17 (×2): qty 30, 30d supply, fill #5

## 2022-12-29 MED ORDER — DESVENLAFAXINE SUCCINATE ER 50 MG PO TB24
50.0000 mg | ORAL_TABLET | Freq: Every day | ORAL | 2 refills | Status: DC
  Filled 2022-12-29: qty 90, 90d supply, fill #0
  Filled 2023-05-26: qty 30, 30d supply, fill #0
  Filled 2023-06-23: qty 30, 30d supply, fill #1
  Filled 2023-07-20: qty 30, 30d supply, fill #2
  Filled 2023-08-27: qty 30, 30d supply, fill #3

## 2023-01-07 ENCOUNTER — Other Ambulatory Visit (HOSPITAL_COMMUNITY): Payer: Self-pay

## 2023-01-09 ENCOUNTER — Other Ambulatory Visit (HOSPITAL_COMMUNITY): Payer: Self-pay

## 2023-01-13 ENCOUNTER — Other Ambulatory Visit (HOSPITAL_COMMUNITY): Payer: Self-pay

## 2023-01-26 ENCOUNTER — Other Ambulatory Visit (HOSPITAL_COMMUNITY): Payer: Self-pay

## 2023-01-26 MED ORDER — VALACYCLOVIR HCL 500 MG PO TABS
500.0000 mg | ORAL_TABLET | Freq: Every day | ORAL | 1 refills | Status: DC
Start: 2023-01-26 — End: 2023-03-30
  Filled 2023-01-26: qty 30, 30d supply, fill #0
  Filled 2023-02-23: qty 30, 30d supply, fill #1

## 2023-01-27 ENCOUNTER — Other Ambulatory Visit: Payer: Self-pay

## 2023-01-27 ENCOUNTER — Other Ambulatory Visit (HOSPITAL_COMMUNITY): Payer: Self-pay

## 2023-02-09 ENCOUNTER — Other Ambulatory Visit (HOSPITAL_COMMUNITY): Payer: Self-pay

## 2023-02-10 ENCOUNTER — Other Ambulatory Visit: Payer: Self-pay

## 2023-02-10 DIAGNOSIS — L603 Nail dystrophy: Secondary | ICD-10-CM | POA: Diagnosis not present

## 2023-02-10 DIAGNOSIS — B351 Tinea unguium: Secondary | ICD-10-CM | POA: Diagnosis not present

## 2023-02-11 ENCOUNTER — Other Ambulatory Visit (HOSPITAL_COMMUNITY): Payer: Self-pay

## 2023-02-11 MED ORDER — TERBINAFINE HCL 250 MG PO TABS
250.0000 mg | ORAL_TABLET | Freq: Every day | ORAL | 2 refills | Status: DC
  Filled 2023-02-11: qty 30, 30d supply, fill #0
  Filled 2023-03-09: qty 30, 30d supply, fill #1
  Filled 2023-04-10: qty 30, 30d supply, fill #2

## 2023-03-09 ENCOUNTER — Other Ambulatory Visit: Payer: Self-pay

## 2023-03-09 ENCOUNTER — Other Ambulatory Visit (HOSPITAL_COMMUNITY): Payer: Self-pay

## 2023-03-24 DIAGNOSIS — B351 Tinea unguium: Secondary | ICD-10-CM | POA: Diagnosis not present

## 2023-03-24 DIAGNOSIS — L603 Nail dystrophy: Secondary | ICD-10-CM | POA: Diagnosis not present

## 2023-03-29 ENCOUNTER — Other Ambulatory Visit (HOSPITAL_COMMUNITY): Payer: Self-pay

## 2023-03-30 ENCOUNTER — Other Ambulatory Visit (HOSPITAL_COMMUNITY): Payer: Self-pay

## 2023-03-30 MED ORDER — VALACYCLOVIR HCL 500 MG PO TABS
500.0000 mg | ORAL_TABLET | Freq: Every day | ORAL | 1 refills | Status: DC
Start: 2023-03-30 — End: 2023-05-25
  Filled 2023-03-30: qty 30, 30d supply, fill #0
  Filled 2023-04-22: qty 30, 30d supply, fill #1

## 2023-04-02 ENCOUNTER — Other Ambulatory Visit: Payer: Self-pay

## 2023-04-02 ENCOUNTER — Other Ambulatory Visit (HOSPITAL_COMMUNITY): Payer: Self-pay

## 2023-04-03 ENCOUNTER — Other Ambulatory Visit (HOSPITAL_COMMUNITY): Payer: Self-pay

## 2023-04-13 ENCOUNTER — Other Ambulatory Visit (HOSPITAL_COMMUNITY): Payer: Self-pay

## 2023-04-18 ENCOUNTER — Other Ambulatory Visit (HOSPITAL_COMMUNITY): Payer: Self-pay

## 2023-04-20 ENCOUNTER — Other Ambulatory Visit (HOSPITAL_COMMUNITY): Payer: Self-pay

## 2023-04-20 MED ORDER — PANTOPRAZOLE SODIUM 40 MG PO TBEC
40.0000 mg | DELAYED_RELEASE_TABLET | Freq: Two times a day (BID) | ORAL | 0 refills | Status: AC
Start: 2023-04-20 — End: ?
  Filled 2023-04-20: qty 60, 30d supply, fill #0
  Filled 2023-06-23: qty 60, 30d supply, fill #1
  Filled 2023-08-14: qty 60, 30d supply, fill #2

## 2023-04-22 ENCOUNTER — Other Ambulatory Visit (HOSPITAL_COMMUNITY): Payer: Self-pay

## 2023-04-28 DIAGNOSIS — B351 Tinea unguium: Secondary | ICD-10-CM | POA: Diagnosis not present

## 2023-04-29 ENCOUNTER — Other Ambulatory Visit: Payer: Self-pay

## 2023-05-01 ENCOUNTER — Other Ambulatory Visit (HOSPITAL_COMMUNITY): Payer: Self-pay

## 2023-05-09 ENCOUNTER — Other Ambulatory Visit (HOSPITAL_COMMUNITY): Payer: Self-pay

## 2023-05-11 ENCOUNTER — Other Ambulatory Visit (HOSPITAL_COMMUNITY): Payer: Self-pay

## 2023-05-16 ENCOUNTER — Other Ambulatory Visit (HOSPITAL_COMMUNITY): Payer: Self-pay

## 2023-05-16 ENCOUNTER — Other Ambulatory Visit (HOSPITAL_BASED_OUTPATIENT_CLINIC_OR_DEPARTMENT_OTHER): Payer: Self-pay

## 2023-05-18 ENCOUNTER — Other Ambulatory Visit (HOSPITAL_COMMUNITY): Payer: Self-pay

## 2023-05-22 ENCOUNTER — Other Ambulatory Visit (HOSPITAL_COMMUNITY): Payer: Self-pay

## 2023-05-23 ENCOUNTER — Other Ambulatory Visit (HOSPITAL_COMMUNITY): Payer: Self-pay

## 2023-05-25 ENCOUNTER — Other Ambulatory Visit (HOSPITAL_COMMUNITY): Payer: Self-pay

## 2023-05-25 MED ORDER — VALACYCLOVIR HCL 500 MG PO TABS
500.0000 mg | ORAL_TABLET | Freq: Every day | ORAL | 1 refills | Status: DC
  Filled 2023-05-25: qty 30, 30d supply, fill #0
  Filled 2023-06-23: qty 30, 30d supply, fill #1

## 2023-05-26 ENCOUNTER — Other Ambulatory Visit (HOSPITAL_COMMUNITY): Payer: Self-pay

## 2023-05-28 ENCOUNTER — Encounter: Payer: Self-pay | Admitting: Internal Medicine

## 2023-06-01 ENCOUNTER — Other Ambulatory Visit (HOSPITAL_COMMUNITY): Payer: Self-pay

## 2023-06-01 MED ORDER — BUPROPION HCL ER (XL) 150 MG PO TB24
150.0000 mg | ORAL_TABLET | Freq: Every morning | ORAL | 1 refills | Status: AC
  Filled 2023-06-01: qty 30, 30d supply, fill #0
  Filled 2023-07-10 (×2): qty 30, 30d supply, fill #1

## 2023-06-01 MED ORDER — DESVENLAFAXINE SUCCINATE ER 50 MG PO TB24
50.0000 mg | ORAL_TABLET | Freq: Every day | ORAL | 2 refills | Status: AC
  Filled 2023-06-01 – 2023-08-17 (×4): qty 30, 30d supply, fill #0

## 2023-06-08 ENCOUNTER — Other Ambulatory Visit (HOSPITAL_COMMUNITY): Payer: Self-pay

## 2023-06-09 ENCOUNTER — Other Ambulatory Visit (HOSPITAL_COMMUNITY): Payer: Self-pay

## 2023-06-09 DIAGNOSIS — Z79899 Other long term (current) drug therapy: Secondary | ICD-10-CM | POA: Diagnosis not present

## 2023-06-09 DIAGNOSIS — M5431 Sciatica, right side: Secondary | ICD-10-CM | POA: Diagnosis not present

## 2023-06-09 DIAGNOSIS — M797 Fibromyalgia: Secondary | ICD-10-CM | POA: Diagnosis not present

## 2023-06-09 DIAGNOSIS — M1991 Primary osteoarthritis, unspecified site: Secondary | ICD-10-CM | POA: Diagnosis not present

## 2023-06-09 MED ORDER — METHOCARBAMOL 500 MG PO TABS
500.0000 mg | ORAL_TABLET | Freq: Three times a day (TID) | ORAL | 3 refills | Status: AC
  Filled 2023-06-12: qty 90, 30d supply, fill #0
  Filled 2023-07-10: qty 90, 30d supply, fill #1

## 2023-06-09 MED ORDER — GABAPENTIN 100 MG PO CAPS
300.0000 mg | ORAL_CAPSULE | Freq: Every day | ORAL | 3 refills | Status: DC
  Filled 2023-06-09: qty 90, 30d supply, fill #0
  Filled 2023-07-10: qty 90, 30d supply, fill #1

## 2023-06-09 MED ORDER — DICLOFENAC SODIUM 75 MG PO TBEC
75.0000 mg | DELAYED_RELEASE_TABLET | Freq: Every day | ORAL | 1 refills | Status: AC
  Filled 2023-06-09: qty 30, 30d supply, fill #0
  Filled 2023-07-10: qty 30, 30d supply, fill #1
  Filled 2023-08-14: qty 30, 30d supply, fill #2

## 2023-06-12 ENCOUNTER — Other Ambulatory Visit (HOSPITAL_COMMUNITY): Payer: Self-pay

## 2023-06-22 DIAGNOSIS — Z0000 Encounter for general adult medical examination without abnormal findings: Secondary | ICD-10-CM | POA: Diagnosis not present

## 2023-06-22 DIAGNOSIS — Z23 Encounter for immunization: Secondary | ICD-10-CM | POA: Diagnosis not present

## 2023-06-22 DIAGNOSIS — Z13220 Encounter for screening for lipoid disorders: Secondary | ICD-10-CM | POA: Diagnosis not present

## 2023-06-24 ENCOUNTER — Other Ambulatory Visit (HOSPITAL_COMMUNITY): Payer: Self-pay

## 2023-06-29 DIAGNOSIS — R531 Weakness: Secondary | ICD-10-CM | POA: Diagnosis not present

## 2023-06-29 DIAGNOSIS — M545 Low back pain, unspecified: Secondary | ICD-10-CM | POA: Diagnosis not present

## 2023-06-29 DIAGNOSIS — M25551 Pain in right hip: Secondary | ICD-10-CM | POA: Diagnosis not present

## 2023-07-02 DIAGNOSIS — R531 Weakness: Secondary | ICD-10-CM | POA: Diagnosis not present

## 2023-07-02 DIAGNOSIS — M25551 Pain in right hip: Secondary | ICD-10-CM | POA: Diagnosis not present

## 2023-07-02 DIAGNOSIS — M545 Low back pain, unspecified: Secondary | ICD-10-CM | POA: Diagnosis not present

## 2023-07-03 ENCOUNTER — Other Ambulatory Visit (HOSPITAL_COMMUNITY): Payer: Self-pay

## 2023-07-07 DIAGNOSIS — R531 Weakness: Secondary | ICD-10-CM | POA: Diagnosis not present

## 2023-07-07 DIAGNOSIS — M545 Low back pain, unspecified: Secondary | ICD-10-CM | POA: Diagnosis not present

## 2023-07-07 DIAGNOSIS — M25551 Pain in right hip: Secondary | ICD-10-CM | POA: Diagnosis not present

## 2023-07-09 DIAGNOSIS — M545 Low back pain, unspecified: Secondary | ICD-10-CM | POA: Diagnosis not present

## 2023-07-09 DIAGNOSIS — M25551 Pain in right hip: Secondary | ICD-10-CM | POA: Diagnosis not present

## 2023-07-09 DIAGNOSIS — R531 Weakness: Secondary | ICD-10-CM | POA: Diagnosis not present

## 2023-07-10 ENCOUNTER — Other Ambulatory Visit (HOSPITAL_COMMUNITY): Payer: Self-pay

## 2023-07-10 ENCOUNTER — Other Ambulatory Visit: Payer: Self-pay

## 2023-07-15 ENCOUNTER — Other Ambulatory Visit (HOSPITAL_COMMUNITY): Payer: Self-pay

## 2023-07-15 DIAGNOSIS — Z6828 Body mass index (BMI) 28.0-28.9, adult: Secondary | ICD-10-CM | POA: Diagnosis not present

## 2023-07-15 DIAGNOSIS — R051 Acute cough: Secondary | ICD-10-CM | POA: Diagnosis not present

## 2023-07-15 DIAGNOSIS — Z03818 Encounter for observation for suspected exposure to other biological agents ruled out: Secondary | ICD-10-CM | POA: Diagnosis not present

## 2023-07-15 DIAGNOSIS — Z01419 Encounter for gynecological examination (general) (routine) without abnormal findings: Secondary | ICD-10-CM | POA: Diagnosis not present

## 2023-07-15 DIAGNOSIS — J019 Acute sinusitis, unspecified: Secondary | ICD-10-CM | POA: Diagnosis not present

## 2023-07-16 ENCOUNTER — Other Ambulatory Visit (HOSPITAL_COMMUNITY): Payer: Self-pay

## 2023-07-17 ENCOUNTER — Other Ambulatory Visit (HOSPITAL_COMMUNITY): Payer: Self-pay

## 2023-07-17 MED ORDER — AZITHROMYCIN 250 MG PO TABS
ORAL_TABLET | ORAL | 0 refills | Status: AC
  Filled 2023-07-17: qty 6, 5d supply, fill #0

## 2023-07-20 ENCOUNTER — Other Ambulatory Visit (HOSPITAL_COMMUNITY): Payer: Self-pay

## 2023-07-20 MED ORDER — VALACYCLOVIR HCL 500 MG PO TABS
500.0000 mg | ORAL_TABLET | Freq: Every day | ORAL | 1 refills | Status: DC
Start: 2023-07-20 — End: 2024-03-17
  Filled 2023-07-20: qty 30, 30d supply, fill #0
  Filled 2023-08-27: qty 30, 30d supply, fill #1

## 2023-07-21 DIAGNOSIS — M25551 Pain in right hip: Secondary | ICD-10-CM | POA: Diagnosis not present

## 2023-07-21 DIAGNOSIS — R531 Weakness: Secondary | ICD-10-CM | POA: Diagnosis not present

## 2023-07-21 DIAGNOSIS — M545 Low back pain, unspecified: Secondary | ICD-10-CM | POA: Diagnosis not present

## 2023-07-23 DIAGNOSIS — M545 Low back pain, unspecified: Secondary | ICD-10-CM | POA: Diagnosis not present

## 2023-07-23 DIAGNOSIS — M25551 Pain in right hip: Secondary | ICD-10-CM | POA: Diagnosis not present

## 2023-07-23 DIAGNOSIS — R531 Weakness: Secondary | ICD-10-CM | POA: Diagnosis not present

## 2023-07-28 DIAGNOSIS — M545 Low back pain, unspecified: Secondary | ICD-10-CM | POA: Diagnosis not present

## 2023-07-28 DIAGNOSIS — M25551 Pain in right hip: Secondary | ICD-10-CM | POA: Diagnosis not present

## 2023-07-28 DIAGNOSIS — R531 Weakness: Secondary | ICD-10-CM | POA: Diagnosis not present

## 2023-07-30 DIAGNOSIS — R531 Weakness: Secondary | ICD-10-CM | POA: Diagnosis not present

## 2023-07-30 DIAGNOSIS — M545 Low back pain, unspecified: Secondary | ICD-10-CM | POA: Diagnosis not present

## 2023-07-30 DIAGNOSIS — M25551 Pain in right hip: Secondary | ICD-10-CM | POA: Diagnosis not present

## 2023-08-04 ENCOUNTER — Ambulatory Visit (INDEPENDENT_AMBULATORY_CARE_PROVIDER_SITE_OTHER): Payer: BLUE CROSS/BLUE SHIELD | Admitting: Student

## 2023-08-04 ENCOUNTER — Ambulatory Visit (INDEPENDENT_AMBULATORY_CARE_PROVIDER_SITE_OTHER): Payer: BC Managed Care – PPO

## 2023-08-04 ENCOUNTER — Encounter (HOSPITAL_BASED_OUTPATIENT_CLINIC_OR_DEPARTMENT_OTHER): Payer: Self-pay | Admitting: Student

## 2023-08-04 DIAGNOSIS — M25551 Pain in right hip: Secondary | ICD-10-CM

## 2023-08-04 DIAGNOSIS — G8929 Other chronic pain: Secondary | ICD-10-CM

## 2023-08-04 DIAGNOSIS — R531 Weakness: Secondary | ICD-10-CM | POA: Diagnosis not present

## 2023-08-04 DIAGNOSIS — M545 Low back pain, unspecified: Secondary | ICD-10-CM | POA: Diagnosis not present

## 2023-08-04 DIAGNOSIS — M5441 Lumbago with sciatica, right side: Secondary | ICD-10-CM | POA: Diagnosis not present

## 2023-08-04 DIAGNOSIS — M5442 Lumbago with sciatica, left side: Secondary | ICD-10-CM

## 2023-08-04 NOTE — Progress Notes (Signed)
Chief Complaint: Low back pain     History of Present Illness:    Priscilla Underwood is a 62 y.o. female presenting today for evaluation of low back pain.  Patient states that this is chronic and has been ongoing for the last 4 to 5 years.  Her pain levels have been increasing over the last month with no known cause.  She did seek out physical therapy and reports just finishing her ninth session however this has not given her any relief.  She states that they did perform some dry needling.  Most of her pain is located in the right side of the low back, and often radiates down the right hip and into the leg.  Pain is moderate to severe and notes that it is worsened when she is driving.  Denies any numbness or tingling.  Denies saddle anesthesia and bowel/bladder dysfunction.  Does report a history of fibromyalgia.  She takes diclofenac daily, and has also tried Robaxin and gabapentin.  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    Surgical History:   None  PMH/PSH/Family History/Social History/Meds/Allergies:    Past Medical History:  Diagnosis Date   Arthritis    osteo arthritis   Depression    Fatty liver    Fibromyalgia    GERD (gastroesophageal reflux disease)    PONV (postoperative nausea and vomiting)    Past Surgical History:  Procedure Laterality Date   38 HOUR PH STUDY N/A 12/14/2019   Procedure: 24 HOUR PH STUDY;  Surgeon: Napoleon Form, MD;  Location: [facility];  Service: Endoscopy;  Laterality: N/A;   BUNIONECTOMY  2004   CARPAL TUNNEL RELEASE Left 06/15/2015   Procedure: LEFT CARPAL TUNNEL RELEASE;  Surgeon: Dominica Severin, MD;  Location: [facility];  Service: Orthopedics;  Laterality: Left;   CARPAL TUNNEL RELEASE Right 08/31/2015   Procedure: RIGHT LIMITED OPEN CARPAL TUNNEL RELEASE;  Surgeon: Dominica Severin, MD;  Location: [facility];  Service: Orthopedics;   Laterality: Right;   ESOPHAGEAL MANOMETRY N/A 12/14/2019   Procedure: ESOPHAGEAL MANOMETRY (EM);  Surgeon: Napoleon Form, MD;  Location: [facility];  Service: Endoscopy;  Laterality: N/A;  DR. Orvan Falconer   LIPOMA EXCISION Right 05/28/2018   Procedure: EXCISION LIPOMA OF SCALP;  Surgeon: Osborn Coho, MD;  Location:  [facility]ERY [facility];  Service: ENT;  Laterality: Right;   SHOULDER SURGERY  2010, 2008   TARSAL TUNNEL RELEASE Left 2012   TUBAL LIGATION  1985   Social History   Socioeconomic History   Marital status: Married    Spouse name: Not on file   Number of children: Not on file   Years of education: Not on file   Highest education level: Not on file  Occupational History   Not on file  Tobacco Use   Smoking status: Former    Types: Cigarettes   Smokeless tobacco: Never   Tobacco comments:    Quit 35 years ago  Vaping Use   Vaping status: Never Used  Substance and Sexual Activity   Alcohol use: No   Drug use: No   Sexual activity: Yes    Birth control/protection: Post-menopausal  Other Topics Concern   Not on file  Social History Narrative   Not on file  Social Determinants of Health   Financial Resource Strain: Not on file  Food Insecurity: Not on file  Transportation Needs: Not on file  Physical Activity: Not on file  Stress: Not on file  Social Connections: Unknown (04/19/2022)   Received from [facility], [facility]   Social Network    Social Network: Not on file   Family History  Problem Relation Age of Onset   Breast cancer Sister 79   Breast cancer Paternal Aunt 15   Lung cancer Mother    Heart disease Father    Heart failure Maternal Grandmother    Lung cancer Maternal Grandfather    Pancreatic cancer Paternal Grandmother    Colon cancer Neg Hx    Esophageal cancer Neg Hx    Rectal cancer Neg Hx    Stomach cancer Neg Hx    Allergies  Allergen Reactions   Ciprofloxacin Rash   Avelox [Moxifloxacin Hcl In Nacl] Rash    Current Outpatient Medications  Medication Sig Dispense Refill   albuterol (VENTOLIN HFA) 108 (90 Base) MCG/ACT inhaler Inhale 2 puffs into the lungs every 6 hours as needed 8.5 g 0   amoxicillin-clavulanate (AUGMENTIN) 875-125 MG tablet Take 1 tablet by mouth every 12 (twelve) hours for 10 days. 20 tablet 0   Azelastine HCl (ASTEPRO) 0.15 % SOLN Use 2 sprays in each nostril once daily. 30 mL 1   Azelastine HCl (ASTEPRO) 0.15 % SOLN Use 2 sprays in each nostril once a day. 30 mL 1   azithromycin (ZITHROMAX) 250 MG tablet Take 2 tablets by mouth on the first day, then take 1 tablet daily for 4 days. 6 tablet 0   buPROPion (WELLBUTRIN XL) 150 MG 24 hr tablet TAKE 1 TABLET BY MOUTH IN THE MORNING 90 tablet 0   buPROPion (WELLBUTRIN XL) 150 MG 24 hr tablet Take 1 tablet by mouth once daily in the morning 90 tablet 1   buPROPion (WELLBUTRIN XL) 150 MG 24 hr tablet Take 1 tablet by mouth once daily in the morning 90 tablet 2   buPROPion (WELLBUTRIN XL) 150 MG 24 hr tablet Take 1 tablet (150 mg) by mouth every morning. 30 tablet 1   desvenlafaxine (PRISTIQ) 100 MG 24 hr tablet desvenlafaxine ER 100 mg tablet,extended release 24 hr  Take 1 tablet every day by oral route.     desvenlafaxine (PRISTIQ) 100 MG 24 hr tablet Take 1 tablet (100 mg total) by mouth daily. 90 tablet 1   desvenlafaxine (PRISTIQ) 50 MG 24 hr tablet Take 1 tablet (50 mg total) by mouth daily. 30 tablet 2   desvenlafaxine (PRISTIQ) 50 MG 24 hr tablet Take 1 tablet (50 mg total) by mouth daily. 90 tablet 2   diclofenac (VOLTAREN) 75 MG EC tablet Take 1 tablet (75 mg total) by mouth 2 (two) times daily as needed. 60 tablet 3   diclofenac (VOLTAREN) 75 MG EC tablet Take 1 tablet (75 mg) by mouth daily for 90 days. 90 tablet 1   doxycycline (ADOXA) 100 MG tablet Take 1 tablet twice daily for 10 days. 20 tablet 0   esomeprazole (NEXIUM) 40 MG capsule Take 40 mg by mouth daily at 12 noon.     famotidine (PEPCID) 10 MG tablet Take 10 mg  by mouth daily.     gabapentin (NEURONTIN) 100 MG capsule Take 3 capsules (300 mg total) by mouth daily as directed 90 capsule 3   methocarbamol (ROBAXIN) 500 MG tablet Take 500 mg by mouth every 8 (eight)  hours as needed for muscle spasms.     methocarbamol (ROBAXIN) 500 MG tablet Take 1 tablet (500 mg total) by mouth 3 (three) times daily for 30 days. 90 tablet 3   metoprolol tartrate (LOPRESSOR) 25 MG tablet TAKE 1 TABLET 2 HOURS PRIOR TO CT 1 tablet 0   mupirocin ointment (BACTROBAN) 2 % Apply to affected area 2 times a day for 14 days 22 g 0   pantoprazole (PROTONIX) 40 MG tablet Take 1 tablet (40 mg total) by mouth 2 (two) times daily. 180 tablet 0   promethazine-dextromethorphan (PROMETHAZINE-DM) 6.25-15 MG/5ML syrup Take 5 mL by mouth every 6 hours as needed for 10 days 200 mL 0   promethazine-dextromethorphan (PROMETHAZINE-DM) 6.25-15 MG/5ML syrup Take every 6 hours as needed for 5 days. 100 mL 0   sertraline (ZOLOFT) 100 MG tablet Take 100 mg by mouth.   5   sucralfate (CARAFATE) 1 g tablet Take 1 g by mouth 4 (four) times daily -  with meals and at bedtime.     sulfamethoxazole-trimethoprim (BACTRIM DS) 800-160 MG tablet Take 1 tablet by mouth 2 times a day for 14 days 28 tablet 0   terbinafine (LAMISIL) 250 MG tablet Please take one a day x 7days, repeat every 4 weeks x 4 months 28 tablet 0   terbinafine (LAMISIL) 250 MG tablet Take 1 tablet (250 mg total) by mouth daily. 30 tablet 2   traMADol (ULTRAM) 50 MG tablet Take 1 tablet  by mouth once a day as needed (30 days) 15 tablet 0   valACYclovir (VALTREX) 1000 MG tablet as needed.   2   valACYclovir (VALTREX) 500 MG tablet Take 1 tablet (500 mg) by mouth daily. 30 tablet 1   zolpidem (AMBIEN) 5 MG tablet Take 5 mg by mouth at bedtime as needed for sleep.   1   zolpidem (AMBIEN) 5 MG tablet Take 1 tablet (5 mg total) by mouth at bedtime as needed (please try NOT to take every evening) 30 tablet 3   No current  facility-administered medications for this visit.   DG Lumbar Spine Complete  Result Date: 08/04/2023 CLINICAL DATA:  Right buttock pain radiating down for several months. EXAM: LUMBAR SPINE - COMPLETE 4+ VIEW COMPARISON:  October 06, 2016 FINDINGS: Scoliosis of spine. Alignment is normal. Multilevel mild to moderate narrow intervertebral spaces, facet joint sclerosis and osteophyte formation identified more prominently involving the L1-2 level. 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 Electronically Signed   By: Sherian Rein M.D.   On: 08/04/2023 14:57    Review of Systems:   A ROS was performed including pertinent positives and negatives as documented in the HPI.  Physical Exam :   Constitutional: NAD and appears stated age Neurological: Alert and oriented Psych: Appropriate affect and cooperative There were no vitals taken for this visit.   Comprehensive Musculoskeletal Exam:    Right sided lumbar tenderness extending down into the right gluteal area.  Soreness felt with palpation over the right greater trochanter.  Passive hip range of motion is full bilaterally, however she does feel some tightness in the low back with passive hip flexion.  Negative straight leg raise bilaterally.  Good strength with resisted hip abduction.  Knee flexion and extension strength 5/5.  Patellar reflexes 2+ bilaterally.  Neurosensory exam intact.  Imaging:   Xray (lumbar spine): Scoliosis noted in AP view.  Mild to moderate degenerative changes throughout the lumbar spine including loss of disc space  and anterior osteophytes.  Xray (AP pelvis and right hip 3 views): Mild femoroacetabular osteoarthritis   I personally reviewed and interpreted the radiographs.   Assessment:   62 y.o. female with chronic history of low back pain.  So far she has gotten little relief with medications and physical therapy and is having right-sided radicular symptoms.  Due to this, I do  believe an MRI of the lumbar spine would be indicated at this time for further assessment.  Will also consider further investigation of involvement from SI joints or gluteus tendinopathy due to her location of pain, however we will wait for results of MRI before proceeding with this.  Plan :    -Obtain MRI of the lumbar spine and return to clinic for review with Dr. Steward Drone     I personally saw and evaluated the patient, and participated in the management and treatment plan.  Hazle Nordmann, PA-C Orthopedics

## 2023-08-14 ENCOUNTER — Other Ambulatory Visit (HOSPITAL_COMMUNITY): Payer: Self-pay

## 2023-08-17 ENCOUNTER — Other Ambulatory Visit (HOSPITAL_COMMUNITY): Payer: Self-pay

## 2023-08-18 ENCOUNTER — Other Ambulatory Visit: Payer: Self-pay | Admitting: Family Medicine

## 2023-08-18 DIAGNOSIS — B9689 Other specified bacterial agents as the cause of diseases classified elsewhere: Secondary | ICD-10-CM | POA: Diagnosis not present

## 2023-08-18 DIAGNOSIS — J019 Acute sinusitis, unspecified: Secondary | ICD-10-CM | POA: Diagnosis not present

## 2023-08-18 DIAGNOSIS — Z1231 Encounter for screening mammogram for malignant neoplasm of breast: Secondary | ICD-10-CM

## 2023-08-27 ENCOUNTER — Other Ambulatory Visit (HOSPITAL_BASED_OUTPATIENT_CLINIC_OR_DEPARTMENT_OTHER): Payer: Self-pay

## 2023-08-27 ENCOUNTER — Other Ambulatory Visit (HOSPITAL_COMMUNITY): Payer: Self-pay

## 2023-08-27 ENCOUNTER — Ambulatory Visit
Admission: RE | Admit: 2023-08-27 | Discharge: 2023-08-27 | Disposition: A | Discharge status: Home / Self Care | Payer: BC Managed Care – PPO | Source: Ambulatory Visit | Attending: Student | Admitting: Student

## 2023-08-27 DIAGNOSIS — M5126 Other intervertebral disc displacement, lumbar region: Secondary | ICD-10-CM | POA: Diagnosis not present

## 2023-08-27 DIAGNOSIS — G8929 Other chronic pain: Secondary | ICD-10-CM

## 2023-08-27 DIAGNOSIS — M5136 Other intervertebral disc degeneration, lumbar region: Secondary | ICD-10-CM | POA: Diagnosis not present

## 2023-09-02 ENCOUNTER — Ambulatory Visit (HOSPITAL_BASED_OUTPATIENT_CLINIC_OR_DEPARTMENT_OTHER): Payer: Self-pay | Admitting: Orthopaedic Surgery

## 2023-09-08 ENCOUNTER — Inpatient Hospital Stay
Admission: RE | Admit: 2023-09-08 | Discharge: 2023-09-08 | Discharge status: Home / Self Care | Payer: BC Managed Care – PPO | Source: Ambulatory Visit | Attending: Family Medicine | Admitting: Family Medicine

## 2023-09-08 DIAGNOSIS — Z1231 Encounter for screening mammogram for malignant neoplasm of breast: Secondary | ICD-10-CM | POA: Diagnosis not present

## 2023-09-18 ENCOUNTER — Ambulatory Visit (HOSPITAL_BASED_OUTPATIENT_CLINIC_OR_DEPARTMENT_OTHER): Payer: BC Managed Care – PPO | Admitting: Orthopaedic Surgery

## 2023-09-18 DIAGNOSIS — M5442 Lumbago with sciatica, left side: Secondary | ICD-10-CM

## 2023-09-18 DIAGNOSIS — G8929 Other chronic pain: Secondary | ICD-10-CM | POA: Diagnosis not present

## 2023-09-18 DIAGNOSIS — M5441 Lumbago with sciatica, right side: Secondary | ICD-10-CM

## 2023-09-18 NOTE — Progress Notes (Signed)
Chief Complaint: Low back pain        History of Present Illness:    09/18/2023: Presents today for MRI review and follow-up of her left lower hip pain.   Priscilla Underwood is a 62 y.o. female presenting today for evaluation of low back pain.  Patient states that this is chronic and has been ongoing for the last 4 to 5 years.  Her pain levels have been increasing over the last month with no known cause.  She did seek out physical therapy and reports just finishing her ninth session however this has not given her any relief.  She states that they did perform some dry needling.  Most of her pain is located in the right side of the low back, and often radiates down the right hip and into the leg.  Pain is moderate to severe and notes that it is worsened when she is driving.  Denies any numbness or tingling.  Denies saddle anesthesia and bowel/bladder dysfunction.  Does report a history of fibromyalgia.  She takes diclofenac daily, and has also tried Robaxin and gabapentin.  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       Surgical History:   None   PMH/PSH/Family History/Social History/Meds/Allergies:         Past Medical History:  Diagnosis Date   Arthritis      osteo arthritis   Depression     Fatty liver     Fibromyalgia     GERD (gastroesophageal reflux disease)     PONV (postoperative nausea and vomiting)               Past Surgical History:  Procedure Laterality Date   52 HOUR PH STUDY N/A 12/14/2019    Procedure: 24 HOUR PH STUDY;  Surgeon: Napoleon Form, MD;  Location: [facility];  Service: Endoscopy;  Laterality: N/A;   BUNIONECTOMY   2004   CARPAL TUNNEL RELEASE Left 06/15/2015    Procedure: LEFT CARPAL TUNNEL RELEASE;  Surgeon: Dominica Severin, MD;  Location: [facility];  Service: Orthopedics;  Laterality: Left;   CARPAL TUNNEL RELEASE Right 08/31/2015    Procedure: RIGHT LIMITED OPEN CARPAL TUNNEL  RELEASE;  Surgeon: Dominica Severin, MD;  Location: [facility];  Service: Orthopedics;  Laterality: Right;   ESOPHAGEAL MANOMETRY N/A 12/14/2019    Procedure: ESOPHAGEAL MANOMETRY (EM);  Surgeon: Napoleon Form, MD;  Location: [facility];  Service: Endoscopy;  Laterality: N/A;  DR. Orvan Falconer   LIPOMA EXCISION Right 05/28/2018    Procedure: EXCISION LIPOMA OF SCALP;  Surgeon: Osborn Coho, MD;  Location: [facility];  Service: ENT;  Laterality: Right;   SHOULDER SURGERY   2010, 2008   TARSAL TUNNEL RELEASE Left 2012   TUBAL LIGATION   1985        Social History         Socioeconomic History   Marital status: Married      Spouse name: Not on file   Number of children: Not on file   Years of education: Not on file   Highest education level: Not on file  Occupational History   Not on file  Tobacco Use   Smoking status: Former      Types: Cigarettes   Smokeless tobacco: Never   Tobacco comments:      Quit 35 years ago  Vaping Use   Vaping status: Never Used  Substance  and Sexual Activity   Alcohol use: No   Drug use: No   Sexual activity: Yes      Birth control/protection: Post-menopausal  Other Topics Concern   Not on file  Social History Narrative   Not on file    Social Determinants of Health        Financial Resource Strain: Not on file  Food Insecurity: Not on file  Transportation Needs: Not on file  Physical Activity: Not on file  Stress: Not on file  Social Connections: Unknown (04/19/2022)    Received from [facility], [facility]    Social Network     Social Network: Not on file         Family History  Problem Relation Age of Onset   Breast cancer Sister 28   Breast cancer Paternal Aunt 69   Lung cancer Mother     Heart disease Father     Heart failure Maternal Grandmother     Lung cancer Maternal Grandfather     Pancreatic cancer Paternal Grandmother     Colon cancer Neg Hx     Esophageal cancer Neg Hx      Rectal cancer Neg Hx     Stomach cancer Neg Hx          Allergies      Allergies  Allergen Reactions   Ciprofloxacin Rash   Avelox [Moxifloxacin Hcl In Nacl] Rash            Current Outpatient Medications  Medication Sig Dispense Refill   albuterol (VENTOLIN HFA) 108 (90 Base) MCG/ACT inhaler Inhale 2 puffs into the lungs every 6 hours as needed 8.5 g 0   amoxicillin-clavulanate (AUGMENTIN) 875-125 MG tablet Take 1 tablet by mouth every 12 (twelve) hours for 10 days. 20 tablet 0   Azelastine HCl (ASTEPRO) 0.15 % SOLN Use 2 sprays in each nostril once daily. 30 mL 1   Azelastine HCl (ASTEPRO) 0.15 % SOLN Use 2 sprays in each nostril once a day. 30 mL 1   azithromycin (ZITHROMAX) 250 MG tablet Take 2 tablets by mouth on the first day, then take 1 tablet daily for 4 days. 6 tablet 0   buPROPion (WELLBUTRIN XL) 150 MG 24 hr tablet TAKE 1 TABLET BY MOUTH IN THE MORNING 90 tablet 0   buPROPion (WELLBUTRIN XL) 150 MG 24 hr tablet Take 1 tablet by mouth once daily in the morning 90 tablet 1   buPROPion (WELLBUTRIN XL) 150 MG 24 hr tablet Take 1 tablet by mouth once daily in the morning 90 tablet 2   buPROPion (WELLBUTRIN XL) 150 MG 24 hr tablet Take 1 tablet (150 mg) by mouth every morning. 30 tablet 1   desvenlafaxine (PRISTIQ) 100 MG 24 hr tablet desvenlafaxine ER 100 mg tablet,extended release 24 hr  Take 1 tablet every day by oral route.       desvenlafaxine (PRISTIQ) 100 MG 24 hr tablet Take 1 tablet (100 mg total) by mouth daily. 90 tablet 1   desvenlafaxine (PRISTIQ) 50 MG 24 hr tablet Take 1 tablet (50 mg total) by mouth daily. 30 tablet 2   desvenlafaxine (PRISTIQ) 50 MG 24 hr tablet Take 1 tablet (50 mg total) by mouth daily. 90 tablet 2   diclofenac (VOLTAREN) 75 MG EC tablet Take 1 tablet (75 mg total) by mouth 2 (two) times daily as needed. 60 tablet 3   diclofenac (VOLTAREN) 75 MG EC tablet Take 1 tablet (75 mg) by mouth daily for 90  days. 90 tablet 1   doxycycline (ADOXA) 100  MG tablet Take 1 tablet twice daily for 10 days. 20 tablet 0   esomeprazole (NEXIUM) 40 MG capsule Take 40 mg by mouth daily at 12 noon.       famotidine (PEPCID) 10 MG tablet Take 10 mg by mouth daily.       gabapentin (NEURONTIN) 100 MG capsule Take 3 capsules (300 mg total) by mouth daily as directed 90 capsule 3   methocarbamol (ROBAXIN) 500 MG tablet Take 500 mg by mouth every 8 (eight) hours as needed for muscle spasms.       methocarbamol (ROBAXIN) 500 MG tablet Take 1 tablet (500 mg total) by mouth 3 (three) times daily for 30 days. 90 tablet 3   metoprolol tartrate (LOPRESSOR) 25 MG tablet TAKE 1 TABLET 2 HOURS PRIOR TO CT 1 tablet 0   mupirocin ointment (BACTROBAN) 2 % Apply to affected area 2 times a day for 14 days 22 g 0   pantoprazole (PROTONIX) 40 MG tablet Take 1 tablet (40 mg total) by mouth 2 (two) times daily. 180 tablet 0   promethazine-dextromethorphan (PROMETHAZINE-DM) 6.25-15 MG/5ML syrup Take 5 mL by mouth every 6 hours as needed for 10 days 200 mL 0   promethazine-dextromethorphan (PROMETHAZINE-DM) 6.25-15 MG/5ML syrup Take every 6 hours as needed for 5 days. 100 mL 0   sertraline (ZOLOFT) 100 MG tablet Take 100 mg by mouth.    5   sucralfate (CARAFATE) 1 g tablet Take 1 g by mouth 4 (four) times daily -  with meals and at bedtime.       sulfamethoxazole-trimethoprim (BACTRIM DS) 800-160 MG tablet Take 1 tablet by mouth 2 times a day for 14 days 28 tablet 0   terbinafine (LAMISIL) 250 MG tablet Please take one a day x 7days, repeat every 4 weeks x 4 months 28 tablet 0   terbinafine (LAMISIL) 250 MG tablet Take 1 tablet (250 mg total) by mouth daily. 30 tablet 2   traMADol (ULTRAM) 50 MG tablet Take 1 tablet  by mouth once a day as needed (30 days) 15 tablet 0   valACYclovir (VALTREX) 1000 MG tablet as needed.    2   valACYclovir (VALTREX) 500 MG tablet Take 1 tablet (500 mg) by mouth daily. 30 tablet 1   zolpidem (AMBIEN) 5 MG tablet Take 5 mg by mouth at bedtime as  needed for sleep.    1   zolpidem (AMBIEN) 5 MG tablet Take 1 tablet (5 mg total) by mouth at bedtime as needed (please try NOT to take every evening) 30 tablet 3      No current facility-administered medications for this visit.       Imaging Results (Last 48 hours)  DG Lumbar Spine Complete   Result Date: 08/04/2023 CLINICAL DATA:  Right buttock pain radiating down for several months. EXAM: LUMBAR SPINE - COMPLETE 4+ VIEW COMPARISON:  October 06, 2016 FINDINGS: Scoliosis of spine. Alignment is normal. Multilevel mild to moderate narrow intervertebral spaces, facet joint sclerosis and osteophyte formation identified more prominently involving the L1-2 level. 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 Electronically Signed   By: Sherian Rein M.D.   On: 08/04/2023 14:57       Review of Systems:   A ROS was performed including pertinent positives and negatives as documented in the HPI.   Physical Exam :   Constitutional: NAD and appears stated age Neurological: Alert and oriented  Psych: Appropriate affect and cooperative There were no vitals taken for this visit.    Comprehensive Musculoskeletal Exam:     Right sided lumbar tenderness extending down into the right gluteal area.  Soreness felt with palpation over the right greater trochanter.  Passive hip range of motion is full bilaterally, however she does feel some tightness in the low back with passive hip flexion.  Negative straight leg raise bilaterally.  Good strength with resisted hip abduction.  Knee flexion and extension strength 5/5.  Patellar reflexes 2+ bilaterally.  Neurosensory exam intact.   Tenderness to palpation over the right SI joint with pain with positive compression at   Imaging:   Xray (lumbar spine): Scoliosis noted in AP view.  Mild to moderate degenerative changes throughout the lumbar spine including loss of disc space and anterior osteophytes.   Xray (AP pelvis and right hip 3  views): Mild femoroacetabular osteoarthritis   MRI lumbar spine: Within normal limits I personally reviewed and interpreted the radiographs.     Assessment:   62 y.o. female with chronic history of low back pain.  At today's visit she did have symptoms that were more consistent with right-sided SI pain.  Given that I would like to recommend a referral to Dr. Shon Baton for an ultrasound-guided injection.  I would also like her to participate with physical therapy at this time.  For a pelvic floor program to assist with her SI pain.  -Return to clinic as necessary

## 2023-09-25 ENCOUNTER — Ambulatory Visit: Payer: BC Managed Care – PPO | Admitting: Sports Medicine

## 2023-09-25 ENCOUNTER — Encounter: Payer: Self-pay | Admitting: Sports Medicine

## 2023-09-25 ENCOUNTER — Other Ambulatory Visit: Payer: Self-pay

## 2023-09-25 DIAGNOSIS — M533 Sacrococcygeal disorders, not elsewhere classified: Secondary | ICD-10-CM | POA: Diagnosis not present

## 2023-09-25 DIAGNOSIS — M5442 Lumbago with sciatica, left side: Secondary | ICD-10-CM | POA: Diagnosis not present

## 2023-09-25 DIAGNOSIS — M5441 Lumbago with sciatica, right side: Secondary | ICD-10-CM

## 2023-09-25 DIAGNOSIS — G8929 Other chronic pain: Secondary | ICD-10-CM | POA: Diagnosis not present

## 2023-09-25 DIAGNOSIS — M7918 Myalgia, other site: Secondary | ICD-10-CM | POA: Diagnosis not present

## 2023-09-25 NOTE — Progress Notes (Signed)
Priscilla Underwood - 62 y.o. female MRN [id number]  Date of birth: 09-26-1961  Office Visit Note: Visit Date: 09/25/2023 PCP: Koren Shiver, DO Referred by: Huel Cote, MD  Subjective: Chief Complaint  Patient presents with   Lower Back - Pain    Right SI joint Injection   HPI: Priscilla Underwood is a pleasant 62 y.o. female who presents today for chronic right-sided low back pain and buttock pain.  She has had low back pain chronically as well as pain described as a burning in the buttocks for many years.  She does have fibromyalgia.  She is managed on Voltaren 75 mg using just once daily as well as gabapentin.  She has done physical therapy in the past, and is scheduled to have this performed for her back and SI joint at [facility] starting in November.  Pertinent ROS were reviewed with the patient and found to be negative unless otherwise specified above in HPI.   Assessment & Plan: Visit Diagnoses:  1. Chronic bilateral low back pain with bilateral sciatica   2. Chronic right SI joint pain   3. Right buttock pain    Plan: Discussed with Steward Drone treatment options for her chronic low back/buttock pain, she also has pain emanating from the right SI joint.  Through shared decision-making, did proceed with ultrasound-guided right SI joint injection. Advised 48 hours of modified rest/activity, may use ice/heat and her oral Voltaren for any postinjection pain.  She will get started in formalized physical therapy, did encourage working on hip abduction as well.  In the future, could consider Cymbalta or a low-dose of Elavil for the burning in the buttock/her fibromyalgia.  She will follow-up with Dr. Steward Drone after this and her therapy.  I am happy to see her as needed.  Follow-up: Return for f/u with Dr. Steward Drone as needed.   Meds & Orders: No orders of the defined types were placed in this encounter.   Orders Placed This Encounter  Procedures   US Guided Needle  Placement - No Linked Charges     Procedures: U/S-guided SI-joint injection, Right   After discussion of risk/benefits/indications, informed verbal consent was obtained. A timeout was then performed. The patient was positioned in a prone position on exam room table with a pillow placed under the pelvis for mild hip flexion. The SI joint area was cleaned and prepped with betadine and alcohol swabs. Sterile ultrasound gel was applied and the ultrasound transducer was placed in an anatomic axial plane over the PSIS, then moved distally over the SI-joint. Using ultrasound guidance, a 22-gauge, 3.5" needle was inserted from a medial to lateral approach utilizing an in-plane approach and directed into the SI-joint. The SI-joint was then injected with a mixture of 4:2 lidocaine:depomedrol with visualization of the injectate flow into the SI-joint under ultrasound visualization. The patient tolerated the procedure well without immediate complications.       Clinical History: No specialty comments available.  She reports that she has quit smoking. Her smoking use included cigarettes. She has never used smokeless tobacco. No results for input(s): "HGBA1C", "LABURIC" in the last 8760 hours.  Objective:    Physical Exam  Gen: Well-appearing, in no acute distress; non-toxic CV:  Well-perfused. Warm.  Resp: Breathing unlabored on room air; no wheezing. Psych: Fluid speech in conversation; appropriate affect; normal thought process Neuro: Sensation intact throughout. No gross coordination deficits.   Ortho Exam - Lumbar: Good motion with flexion and extension.  There is  4/5 strength of right hip abduction compared to 5/5 strength of the contralateral hip.  Positive Fortin's point test of the right SI joint, positive Gaenslen's test.  Imaging:  Narrative & Impression  CLINICAL DATA:  Five year history of back pain   EXAM: MRI LUMBAR SPINE WITHOUT CONTRAST   TECHNIQUE: Multiplanar, multisequence MR  imaging of the lumbar spine was performed. No intravenous contrast was administered.   COMPARISON:  None Available.   FINDINGS: Segmentation:  Standard.   Alignment: Trace retrolisthesis of L1 on L2. Grade 1 anterolisthesis of L3 on L4.   Vertebrae: No fracture, evidence of discitis, or bone lesion. Small Schmorl's node at L5.   Conus medullaris and cauda equina: Conus extends to the L2 level. Conus and cauda equina appear normal.   Paraspinal and other soft tissues: Negative.   Disc levels:   T12-L1: Mild bilateral facet degenerative change. Minimal disc bulge. No spinal canal narrowing. No neural foraminal narrowing.   L1-L2: Eccentric left disc bulge. Mild bilateral facet degenerative change. Mild spinal canal narrowing. Moderate left and mild right neural foraminal narrowing.   L2-L3: Mild bilateral facet degenerative change. No significant disc bulge. No spinal canal narrowing. No neural foraminal narrowing.   L3-L4: Moderate bilateral facet degenerative change. Circumferential disc bulge. Moderate spinal canal narrowing. Mild left and moderate right neural foraminal narrowing.   L4-L5: Mild bilateral facet degenerative change. Circumferential disc bulge. Mild spinal canal narrowing. Mild bilateral neural foraminal narrowing.   L5-S1: Central disc protrusion. Moderate left and mild right facet degenerative change. Mild overall spinal canal narrowing. Mild bilateral neural foraminal narrowing.   IMPRESSION: Multilevel degenerative changes of the lumbar spine, most pronounced at L3-L4 where there is moderate spinal canal narrowing and moderate right neural foraminal narrowing.     Electronically Signed   By: Lorenza Cambridge M.D.   On: 09/08/2023 14:39    Past Medical/Family/Surgical/Social History: Medications & Allergies reviewed per EMR, new medications updated. Patient Active Problem List   Diagnosis Date Noted   Gastroesophageal reflux disease    Globus  sensation    Belching    Lipoma of scalp 05/28/2018   Past Medical History:  Diagnosis Date   Arthritis    osteo arthritis   Depression    Fatty liver    Fibromyalgia    GERD (gastroesophageal reflux disease)    PONV (postoperative nausea and vomiting)    Family History  Problem Relation Age of Onset   Breast cancer Sister 73   Breast cancer Paternal Aunt 9   Lung cancer Mother    Heart disease Father    Heart failure Maternal Grandmother    Lung cancer Maternal Grandfather    Pancreatic cancer Paternal Grandmother    Colon cancer Neg Hx    Esophageal cancer Neg Hx    Rectal cancer Neg Hx    Stomach cancer Neg Hx    Past Surgical History:  Procedure Laterality Date   19 HOUR PH STUDY N/A 12/14/2019   Procedure: 24 HOUR PH STUDY;  Surgeon: Napoleon Form, MD;  Location: [facility];  Service: Endoscopy;  Laterality: N/A;   BUNIONECTOMY  2004   CARPAL TUNNEL RELEASE Left 06/15/2015   Procedure: LEFT CARPAL TUNNEL RELEASE;  Surgeon: Dominica Severin, MD;  Location: [facility];  Service: Orthopedics;  Laterality: Left;   CARPAL TUNNEL RELEASE Right 08/31/2015   Procedure: RIGHT LIMITED OPEN CARPAL TUNNEL RELEASE;  Surgeon: Dominica Severin, MD;  Location: [facility];  Service: Orthopedics;  Laterality: Right;   ESOPHAGEAL MANOMETRY N/A 12/14/2019   Procedure: ESOPHAGEAL MANOMETRY (EM);  Surgeon: Napoleon Form, MD;  Location: [facility];  Service: Endoscopy;  Laterality: N/A;  DR. Orvan Falconer   LIPOMA EXCISION Right 05/28/2018   Procedure: EXCISION LIPOMA OF SCALP;  Surgeon: Osborn Coho, MD;  Location: [facility];  Service: ENT;  Laterality: Right;   SHOULDER SURGERY  2010, 2008   TARSAL TUNNEL RELEASE Left 2012   TUBAL LIGATION  1985   Social History   Occupational History   Not on file  Tobacco Use   Smoking status: Former    Types: Cigarettes   Smokeless tobacco: Never   Tobacco comments:    Quit 35 years ago   Vaping Use   Vaping status: Never Used  Substance and Sexual Activity   Alcohol use: No   Drug use: No   Sexual activity: Yes    Birth control/protection: Post-menopausal

## 2023-09-28 ENCOUNTER — Encounter: Payer: Managed Care, Other (non HMO) | Admitting: Internal Medicine

## 2023-09-30 DIAGNOSIS — F330 Major depressive disorder, recurrent, mild: Secondary | ICD-10-CM | POA: Diagnosis not present

## 2023-09-30 DIAGNOSIS — F4323 Adjustment disorder with mixed anxiety and depressed mood: Secondary | ICD-10-CM | POA: Diagnosis not present

## 2023-10-05 DIAGNOSIS — K2950 Unspecified chronic gastritis without bleeding: Secondary | ICD-10-CM | POA: Diagnosis not present

## 2023-10-27 ENCOUNTER — Ambulatory Visit (HOSPITAL_BASED_OUTPATIENT_CLINIC_OR_DEPARTMENT_OTHER): Payer: BC Managed Care – PPO | Admitting: Student

## 2023-10-27 ENCOUNTER — Encounter (HOSPITAL_BASED_OUTPATIENT_CLINIC_OR_DEPARTMENT_OTHER): Payer: Self-pay | Admitting: Student

## 2023-10-27 ENCOUNTER — Ambulatory Visit (HOSPITAL_BASED_OUTPATIENT_CLINIC_OR_DEPARTMENT_OTHER): Payer: BC Managed Care – PPO

## 2023-10-27 ENCOUNTER — Ambulatory Visit (HOSPITAL_BASED_OUTPATIENT_CLINIC_OR_DEPARTMENT_OTHER): Payer: BC Managed Care – PPO | Admitting: Physical Therapy

## 2023-10-27 ENCOUNTER — Telehealth (HOSPITAL_BASED_OUTPATIENT_CLINIC_OR_DEPARTMENT_OTHER): Payer: Self-pay | Admitting: Physical Therapy

## 2023-10-27 DIAGNOSIS — M25561 Pain in right knee: Secondary | ICD-10-CM

## 2023-10-27 DIAGNOSIS — M25461 Effusion, right knee: Secondary | ICD-10-CM | POA: Diagnosis not present

## 2023-10-27 DIAGNOSIS — M1711 Unilateral primary osteoarthritis, right knee: Secondary | ICD-10-CM | POA: Diagnosis not present

## 2023-10-27 MED ORDER — LIDOCAINE HCL 1 % IJ SOLN
4.0000 mL | INTRAMUSCULAR | Status: AC | PRN
  Administered 2023-10-27: 4 mL

## 2023-10-27 MED ORDER — TRIAMCINOLONE ACETONIDE 40 MG/ML IJ SUSP
2.0000 mL | INTRAMUSCULAR | Status: AC | PRN
  Administered 2023-10-27: 2 mL via INTRA_ARTICULAR

## 2023-10-27 NOTE — Progress Notes (Signed)
Chief Complaint: Right knee pain     History of Present Illness:    Priscilla Underwood is a 62 y.o. female presenting today for evaluation of right knee pain.  She denies any injury but states that her knee has been painful now for the last month.  She was babysitting her grandchildren and was bending down frequently as a result.  Her pain is a 3/10 today which she states began in the posterior knee but now also exists on the medial side.  She has noticed swelling as well as a frequent popping sensation.  She does also feel like her knee is going to give out so she has been wearing a brace.  Has been taking Tylenol for pain as well as icing.  No prior history of knee surgery, injections, or physical therapy.   Surgical History:   None  PMH/PSH/Family History/Social History/Meds/Allergies:    Past Medical History:  Diagnosis Date   Arthritis    osteo arthritis   Depression    Fatty liver    Fibromyalgia    GERD (gastroesophageal reflux disease)    PONV (postoperative nausea and vomiting)    Past Surgical History:  Procedure Laterality Date   34 HOUR PH STUDY N/A 12/14/2019   Procedure: 24 HOUR PH STUDY;  Surgeon: Napoleon Form, MD;  Location: [facility];  Service: Endoscopy;  Laterality: N/A;   BUNIONECTOMY  2004   CARPAL TUNNEL RELEASE Left 06/15/2015   Procedure: LEFT CARPAL TUNNEL RELEASE;  Surgeon: Dominica Severin, MD;  Location: [facility];  Service: Orthopedics;  Laterality: Left;   CARPAL TUNNEL RELEASE Right 08/31/2015   Procedure: RIGHT LIMITED OPEN CARPAL TUNNEL RELEASE;  Surgeon: Dominica Severin, MD;  Location: [facility];  Service: Orthopedics;  Laterality: Right;   ESOPHAGEAL MANOMETRY N/A 12/14/2019   Procedure: ESOPHAGEAL MANOMETRY (EM);  Surgeon: Napoleon Form, MD;  Location: [facility];  Service: Endoscopy;  Laterality: N/A;  DR. Orvan Falconer   LIPOMA EXCISION Right 05/28/2018   Procedure: EXCISION  LIPOMA OF SCALP;  Surgeon: Osborn Coho, MD;  Location: [facility];  Service: ENT;  Laterality: Right;   SHOULDER SURGERY  2010, 2008   TARSAL TUNNEL RELEASE Left 2012   TUBAL LIGATION  1985   Social History   Socioeconomic History   Marital status: Married    Spouse name: Not on file   Number of children: Not on file   Years of education: Not on file   Highest education level: Not on file  Occupational History   Not on file  Tobacco Use   Smoking status: Former    Types: Cigarettes   Smokeless tobacco: Never   Tobacco comments:    Quit 35 years ago  Vaping Use   Vaping status: Never Used  Substance and Sexual Activity   Alcohol use: No   Drug use: No   Sexual activity: Yes    Birth control/protection: Post-menopausal  Other Topics Concern   Not on file  Social History Narrative   Not on file   Social Determinants of Health   Financial Resource Strain: Not on file  Food Insecurity: Not on file  Transportation Needs: Not on file  Physical Activity: Not on file  Stress: Not on file  Social Connections: Unknown (04/19/2022)   [facility]   Social Network    Social Network: Not on file   Family History  Problem Relation Age of Onset   Breast cancer Sister 64   Breast cancer Paternal Aunt 21   Lung cancer Mother    Heart disease Father    Heart failure Maternal Grandmother    Lung cancer Maternal Grandfather    Pancreatic cancer Paternal Grandmother    Colon cancer Neg Hx    Esophageal cancer Neg Hx    Rectal cancer Neg Hx    Stomach cancer Neg Hx    Allergies  Allergen Reactions   Ciprofloxacin Rash   Avelox [Moxifloxacin Hcl In Nacl] Rash   Current Outpatient Medications  Medication Sig Dispense Refill   albuterol (VENTOLIN HFA) 108 (90 Base) MCG/ACT inhaler Inhale 2 puffs into the lungs every 6 hours as needed 8.5 g 0   amoxicillin-clavulanate (AUGMENTIN) 875-125 MG tablet Take 1 tablet by mouth every 12  (twelve) hours for 10 days. 20 tablet 0   Azelastine HCl (ASTEPRO) 0.15 % SOLN Use 2 sprays in each nostril once daily. 30 mL 1   Azelastine HCl (ASTEPRO) 0.15 % SOLN Use 2 sprays in each nostril once a day. 30 mL 1   azithromycin (ZITHROMAX) 250 MG tablet Take 2 tablets by mouth on the first day, then take 1 tablet daily for 4 days. 6 tablet 0   buPROPion (WELLBUTRIN XL) 150 MG 24 hr tablet TAKE 1 TABLET BY MOUTH IN THE MORNING 90 tablet 0   buPROPion (WELLBUTRIN XL) 150 MG 24 hr tablet Take 1 tablet by mouth once daily in the morning 90 tablet 1   buPROPion (WELLBUTRIN XL) 150 MG 24 hr tablet Take 1 tablet by mouth once daily in the morning 90 tablet 2   buPROPion (WELLBUTRIN XL) 150 MG 24 hr tablet Take 1 tablet (150 mg) by mouth every morning. 30 tablet 1   desvenlafaxine (PRISTIQ) 100 MG 24 hr tablet desvenlafaxine ER 100 mg tablet,extended release 24 hr  Take 1 tablet every day by oral route.     desvenlafaxine (PRISTIQ) 100 MG 24 hr tablet Take 1 tablet (100 mg total) by mouth daily. 90 tablet 1   desvenlafaxine (PRISTIQ) 50 MG 24 hr tablet Take 1 tablet (50 mg total) by mouth daily. 30 tablet 2   desvenlafaxine (PRISTIQ) 50 MG 24 hr tablet Take 1 tablet (50 mg total) by mouth daily. 90 tablet 2   diclofenac (VOLTAREN) 75 MG EC tablet Take 1 tablet (75 mg total) by mouth 2 (two) times daily as needed. 60 tablet 3   diclofenac (VOLTAREN) 75 MG EC tablet Take 1 tablet (75 mg) by mouth daily for 90 days. 90 tablet 1   doxycycline (ADOXA) 100 MG tablet Take 1 tablet twice daily for 10 days. 20 tablet 0   esomeprazole (NEXIUM) 40 MG capsule Take 40 mg by mouth daily at 12 noon.     famotidine (PEPCID) 10 MG tablet Take 10 mg by mouth daily.     gabapentin (NEURONTIN) 100 MG capsule Take 3 capsules (300 mg total) by mouth daily as directed 90 capsule 3   methocarbamol (ROBAXIN) 500 MG tablet Take 500 mg by mouth every 8 (eight) hours as needed for muscle spasms.     methocarbamol (ROBAXIN) 500  MG tablet Take 1 tablet (500 mg total) by mouth 3 (three) times daily for 30 days. 90 tablet 3   metoprolol tartrate (LOPRESSOR) 25 MG tablet TAKE 1 TABLET  2 HOURS PRIOR TO CT 1 tablet 0   mupirocin ointment (BACTROBAN) 2 % Apply to affected area 2 times a day for 14 days 22 g 0   pantoprazole (PROTONIX) 40 MG tablet Take 1 tablet (40 mg total) by mouth 2 (two) times daily. 180 tablet 0   promethazine-dextromethorphan (PROMETHAZINE-DM) 6.25-15 MG/5ML syrup Take 5 mL by mouth every 6 hours as needed for 10 days 200 mL 0   promethazine-dextromethorphan (PROMETHAZINE-DM) 6.25-15 MG/5ML syrup Take every 6 hours as needed for 5 days. 100 mL 0   sertraline (ZOLOFT) 100 MG tablet Take 100 mg by mouth.   5   sucralfate (CARAFATE) 1 g tablet Take 1 g by mouth 4 (four) times daily -  with meals and at bedtime.     sulfamethoxazole-trimethoprim (BACTRIM DS) 800-160 MG tablet Take 1 tablet by mouth 2 times a day for 14 days 28 tablet 0   terbinafine (LAMISIL) 250 MG tablet Please take one a day x 7days, repeat every 4 weeks x 4 months 28 tablet 0   terbinafine (LAMISIL) 250 MG tablet Take 1 tablet (250 mg total) by mouth daily. 30 tablet 2   traMADol (ULTRAM) 50 MG tablet Take 1 tablet  by mouth once a day as needed (30 days) 15 tablet 0   valACYclovir (VALTREX) 1000 MG tablet as needed.   2   valACYclovir (VALTREX) 500 MG tablet Take 1 tablet (500 mg) by mouth daily. 30 tablet 1   zolpidem (AMBIEN) 5 MG tablet Take 5 mg by mouth at bedtime as needed for sleep.   1   zolpidem (AMBIEN) 5 MG tablet Take 1 tablet (5 mg total) by mouth at bedtime as needed (please try NOT to take every evening) 30 tablet 3   No current facility-administered medications for this visit.   No results found.  Review of Systems:   A ROS was performed including pertinent positives and negatives as documented in the HPI.  Physical Exam :   Constitutional: NAD and appears stated age Neurological: Alert and oriented Psych:  Appropriate affect and cooperative There were no vitals taken for this visit.   Comprehensive Musculoskeletal Exam:    Active range of motion of the right knee from 0 to 130 degrees without crepitus.  Positive tenderness over the medial joint line and in the popliteal fossa.  Knee flexion and extension strength is 5/5.  No laxity with varus or valgus stress and negative Lachman.  Popping sensation elicited without pain with McMurray.  Imaging:   Xray (right knee 4 views): Joint space narrowing and osteophytes noted of the medial compartment indicating mild to moderate osteoarthritis.  Small patellofemoral osteophytes.   I personally reviewed and interpreted the radiographs.   Assessment:   62 y.o. female with 1 month history of right knee pain.  Her pain is mainly medial and posterior and there is evidence of osteoarthritis most notable in the medial compartment.  Given this in addition to some mechanical symptoms I am concerned for an existing meniscus injury, potentially a root tear.  At this point I would like to obtain an MRI to further evaluate this and to ensure that her osteoarthritis is not likely to accelerate.  I have offered a cortisone injection today for symptom relief which patient is agreeable to.  This was performed in clinic and she tolerated the procedure well.  Will plan to see her back after the MRI to discuss treatment plan.  Plan :    -Cortisone injection  performed of the right knee today after patient consent -Obtain MRI of the right knee and return for review     Procedure Note  Patient: Priscilla Underwood             Date of Birth: 08-10-1961           MRN: [id number]             Visit Date: 10/27/2023  Procedures: Visit Diagnoses:  1. Acute pain of right knee     Large Joint Inj: R knee on 10/27/2023 6:29 PM Indications: pain Details: 22 G 1.5 in needle, anterolateral approach Medications: 4 mL lidocaine 1 %; 2 mL triamcinolone acetonide 40  MG/ML Outcome: tolerated well, no immediate complications Procedure, treatment alternatives, risks and benefits explained, specific risks discussed. Consent was given by the patient. Immediately prior to procedure a time out was called to verify the correct patient, procedure, equipment, support staff and site/side marked as required. Patient was prepped and draped in the usual sterile fashion.      I personally saw and evaluated the patient, and participated in the management and treatment plan.  Hazle Nordmann, PA-C Orthopedics

## 2023-10-27 NOTE — Telephone Encounter (Signed)
LVM regarding NS & requested call back or use ticket scheduling to RS.   Luiz Trumpower C. Ashlin Hidalgo PT, DPT 10/27/23 8:59 AM

## 2023-10-28 ENCOUNTER — Ambulatory Visit (HOSPITAL_BASED_OUTPATIENT_CLINIC_OR_DEPARTMENT_OTHER): Payer: BC Managed Care – PPO | Admitting: Student

## 2023-10-28 NOTE — Addendum Note (Signed)
Addended by: Myer Haff on: 10/28/2023 09:26 AM   Modules accepted: Orders

## 2023-11-03 DIAGNOSIS — Z1211 Encounter for screening for malignant neoplasm of colon: Secondary | ICD-10-CM | POA: Diagnosis not present

## 2023-11-16 ENCOUNTER — Encounter (HOSPITAL_BASED_OUTPATIENT_CLINIC_OR_DEPARTMENT_OTHER): Payer: Self-pay | Admitting: Student

## 2023-11-21 ENCOUNTER — Ambulatory Visit
Admission: RE | Admit: 2023-11-21 | Discharge: 2023-11-21 | Disposition: A | Discharge status: Home / Self Care | Payer: BC Managed Care – PPO | Source: Ambulatory Visit | Attending: Student | Admitting: Student

## 2023-11-21 DIAGNOSIS — M25561 Pain in right knee: Secondary | ICD-10-CM | POA: Diagnosis not present

## 2023-11-21 DIAGNOSIS — R600 Localized edema: Secondary | ICD-10-CM | POA: Diagnosis not present

## 2023-11-23 DIAGNOSIS — B351 Tinea unguium: Secondary | ICD-10-CM | POA: Diagnosis not present

## 2023-11-23 DIAGNOSIS — L600 Ingrowing nail: Secondary | ICD-10-CM | POA: Diagnosis not present

## 2023-11-23 DIAGNOSIS — M79675 Pain in left toe(s): Secondary | ICD-10-CM | POA: Diagnosis not present

## 2023-11-23 DIAGNOSIS — L603 Nail dystrophy: Secondary | ICD-10-CM | POA: Diagnosis not present

## 2023-11-26 ENCOUNTER — Encounter (HOSPITAL_BASED_OUTPATIENT_CLINIC_OR_DEPARTMENT_OTHER): Payer: Self-pay | Admitting: Student

## 2023-11-26 ENCOUNTER — Ambulatory Visit (HOSPITAL_BASED_OUTPATIENT_CLINIC_OR_DEPARTMENT_OTHER): Payer: BC Managed Care – PPO | Admitting: Student

## 2023-11-26 DIAGNOSIS — M25561 Pain in right knee: Secondary | ICD-10-CM | POA: Diagnosis not present

## 2023-11-26 NOTE — Progress Notes (Signed)
Chief Complaint: Right knee pain     History of Present Illness:   11/26/23: Patient is here today for MRI review of the right knee.  She reports that her knee feels significantly better after injection performed at last visit.  Pain is minimal without use of medications.  Does note some continued popping and locking of the knee however these are much less frequent and severe.   Priscilla Underwood is a 62 y.o. female presenting today for evaluation of right knee pain.  She denies any injury but states that her knee has been painful now for the last month.  She was babysitting her grandchildren and was bending down frequently as a result.  Her pain is a 3/10 today which she states began in the posterior knee but now also exists on the medial side.  She has noticed swelling as well as a frequent popping sensation.  She does also feel like her knee is going to give out so she has been wearing a brace.  Has been taking Tylenol for pain as well as icing.  No prior history of knee surgery, injections, or physical therapy.   Surgical History:   None  PMH/PSH/Family History/Social History/Meds/Allergies:    Past Medical History:  Diagnosis Date   Arthritis    osteo arthritis   Depression    Fatty liver    Fibromyalgia    GERD (gastroesophageal reflux disease)    PONV (postoperative nausea and vomiting)    Past Surgical History:  Procedure Laterality Date   40 HOUR PH STUDY N/A 12/14/2019   Procedure: 24 HOUR PH STUDY;  Surgeon: Napoleon Form, MD;  Location: [facility];  Service: Endoscopy;  Laterality: N/A;   BUNIONECTOMY  2004   CARPAL TUNNEL RELEASE Left 06/15/2015   Procedure: LEFT CARPAL TUNNEL RELEASE;  Surgeon: Dominica Severin, MD;  Location: [facility];  Service: Orthopedics;  Laterality: Left;   CARPAL TUNNEL RELEASE Right 08/31/2015   Procedure: RIGHT LIMITED OPEN CARPAL TUNNEL RELEASE;  Surgeon: Dominica Severin, MD;  Location:  [facility];  Service: Orthopedics;  Laterality: Right;   ESOPHAGEAL MANOMETRY N/A 12/14/2019   Procedure: ESOPHAGEAL MANOMETRY (EM);  Surgeon: Napoleon Form, MD;  Location: [facility];  Service: Endoscopy;  Laterality: N/A;  DR. Orvan Falconer   LIPOMA EXCISION Right 05/28/2018   Procedure: EXCISION LIPOMA OF SCALP;  Surgeon: Osborn Coho, MD;  Location:  SURGERY CENTER;  Service: ENT;  Laterality: Right;   SHOULDER SURGERY  2010, 2008   TARSAL TUNNEL RELEASE Left 2012   TUBAL LIGATION  1985   Social History   Socioeconomic History   Marital status: Married    Spouse name: Not on file   Number of children: Not on file   Years of education: Not on file   Highest education level: Not on file  Occupational History   Not on file  Tobacco Use   Smoking status: Former    Types: Cigarettes   Smokeless tobacco: Never   Tobacco comments:    Quit 35 years ago  Vaping Use   Vaping status: Never Used  Substance and Sexual Activity   Alcohol use: No   Drug use: No   Sexual activity: Yes    Birth control/protection: Post-menopausal  Other Topics Concern   Not on file  Social History Narrative   Not on file   Social Drivers of Health   Financial Resource Strain: Low Risk  (11/20/2023)   Received from [facility]   Overall Financial Resource Strain (CARDIA)    Difficulty of Paying Living Expenses: Not hard at all  Food Insecurity: No Food Insecurity (11/20/2023)   Received from [facility]   Hunger Vital Sign    Worried About Running Out of Food in the Last Year: Never true    Ran Out of Food in the Last Year: Never true  Transportation Needs: No Transportation Needs (11/20/2023)   Received from [facility] - Transportation    Lack of Transportation (Medical): No    Lack of Transportation (Non-Medical): No  Physical Activity: Sufficiently Active (11/20/2023)   Received from [facility]   Exercise Vital Sign    Days of Exercise per  Week: 3 days    Minutes of Exercise per Session: 60 min  Stress: No Stress Concern Present (11/20/2023)   Received from [facility] - Occupational Stress Questionnaire    Feeling of Stress : Not at all  Social Connections: Socially Integrated (11/20/2023)   Received from [facility]   Social Network    How would you rate your social network (family, work, friends)?: Good participation with social networks   Family History  Problem Relation Age of Onset   Breast cancer Sister 92   Breast cancer Paternal Aunt 64   Lung cancer Mother    Heart disease Father    Heart failure Maternal Grandmother    Lung cancer Maternal Grandfather    Pancreatic cancer Paternal Grandmother    Colon cancer Neg Hx    Esophageal cancer Neg Hx    Rectal cancer Neg Hx    Stomach cancer Neg Hx    Allergies  Allergen Reactions   Ciprofloxacin Rash   Avelox [Moxifloxacin Hcl In Nacl] Rash   Current Outpatient Medications  Medication Sig Dispense Refill   albuterol (VENTOLIN HFA) 108 (90 Base) MCG/ACT inhaler Inhale 2 puffs into the lungs every 6 hours as needed 8.5 g 0   amoxicillin-clavulanate (AUGMENTIN) 875-125 MG tablet Take 1 tablet by mouth every 12 (twelve) hours for 10 days. 20 tablet 0   Azelastine HCl (ASTEPRO) 0.15 % SOLN Use 2 sprays in each nostril once daily. 30 mL 1   Azelastine HCl (ASTEPRO) 0.15 % SOLN Use 2 sprays in each nostril once a day. 30 mL 1   azithromycin (ZITHROMAX) 250 MG tablet Take 2 tablets by mouth on the first day, then take 1 tablet daily for 4 days. 6 tablet 0   buPROPion (WELLBUTRIN XL) 150 MG 24 hr tablet TAKE 1 TABLET BY MOUTH IN THE MORNING 90 tablet 0   buPROPion (WELLBUTRIN XL) 150 MG 24 hr tablet Take 1 tablet by mouth once daily in the morning 90 tablet 1   buPROPion (WELLBUTRIN XL) 150 MG 24 hr tablet Take 1 tablet by mouth once daily in the morning 90 tablet 2   buPROPion (WELLBUTRIN XL) 150 MG 24 hr tablet Take 1  tablet (150 mg) by mouth every morning. 30 tablet 1   desvenlafaxine (PRISTIQ) 100 MG 24 hr tablet desvenlafaxine ER 100 mg tablet,extended release 24 hr  Take 1 tablet every day by oral route.     desvenlafaxine (PRISTIQ) 100 MG 24 hr tablet Take 1 tablet (100 mg total) by mouth daily. 90 tablet 1   desvenlafaxine (  PRISTIQ) 50 MG 24 hr tablet Take 1 tablet (50 mg total) by mouth daily. 30 tablet 2   desvenlafaxine (PRISTIQ) 50 MG 24 hr tablet Take 1 tablet (50 mg total) by mouth daily. 90 tablet 2   diclofenac (VOLTAREN) 75 MG EC tablet Take 1 tablet (75 mg total) by mouth 2 (two) times daily as needed. 60 tablet 3   diclofenac (VOLTAREN) 75 MG EC tablet Take 1 tablet (75 mg) by mouth daily for 90 days. 90 tablet 1   doxycycline (ADOXA) 100 MG tablet Take 1 tablet twice daily for 10 days. 20 tablet 0   esomeprazole (NEXIUM) 40 MG capsule Take 40 mg by mouth daily at 12 noon.     famotidine (PEPCID) 10 MG tablet Take 10 mg by mouth daily.     gabapentin (NEURONTIN) 100 MG capsule Take 3 capsules (300 mg total) by mouth daily as directed 90 capsule 3   methocarbamol (ROBAXIN) 500 MG tablet Take 500 mg by mouth every 8 (eight) hours as needed for muscle spasms.     methocarbamol (ROBAXIN) 500 MG tablet Take 1 tablet (500 mg total) by mouth 3 (three) times daily for 30 days. 90 tablet 3   metoprolol tartrate (LOPRESSOR) 25 MG tablet TAKE 1 TABLET 2 HOURS PRIOR TO CT 1 tablet 0   mupirocin ointment (BACTROBAN) 2 % Apply to affected area 2 times a day for 14 days 22 g 0   pantoprazole (PROTONIX) 40 MG tablet Take 1 tablet (40 mg total) by mouth 2 (two) times daily. 180 tablet 0   promethazine-dextromethorphan (PROMETHAZINE-DM) 6.25-15 MG/5ML syrup Take 5 mL by mouth every 6 hours as needed for 10 days 200 mL 0   promethazine-dextromethorphan (PROMETHAZINE-DM) 6.25-15 MG/5ML syrup Take every 6 hours as needed for 5 days. 100 mL 0   sertraline (ZOLOFT) 100 MG tablet Take 100 mg by mouth.   5    sucralfate (CARAFATE) 1 g tablet Take 1 g by mouth 4 (four) times daily -  with meals and at bedtime.     sulfamethoxazole-trimethoprim (BACTRIM DS) 800-160 MG tablet Take 1 tablet by mouth 2 times a day for 14 days 28 tablet 0   terbinafine (LAMISIL) 250 MG tablet Please take one a day x 7days, repeat every 4 weeks x 4 months 28 tablet 0   terbinafine (LAMISIL) 250 MG tablet Take 1 tablet (250 mg total) by mouth daily. 30 tablet 2   traMADol (ULTRAM) 50 MG tablet Take 1 tablet  by mouth once a day as needed (30 days) 15 tablet 0   valACYclovir (VALTREX) 1000 MG tablet as needed.   2   valACYclovir (VALTREX) 500 MG tablet Take 1 tablet (500 mg) by mouth daily. 30 tablet 1   zolpidem (AMBIEN) 5 MG tablet Take 5 mg by mouth at bedtime as needed for sleep.   1   zolpidem (AMBIEN) 5 MG tablet Take 1 tablet (5 mg total) by mouth at bedtime as needed (please try NOT to take every evening) 30 tablet 3   No current facility-administered medications for this visit.   No results found.  Review of Systems:   A ROS was performed including pertinent positives and negatives as documented in the HPI.  Physical Exam :   Constitutional: NAD and appears stated age Neurological: Alert and oriented Psych: Appropriate affect and cooperative There were no vitals taken for this visit.   Comprehensive Musculoskeletal Exam:    Right knee exam demonstrates active range of motion  from 0 to 130 degrees with mild palpable crepitus.  No notable swelling or effusion.  Negative for bilateral joint line tenderness.  Small Baker's cyst posteriorly.  Stable collateral ligaments with varus and valgus stress.  Imaging:   MRI (right knee): Small posterior lateral meniscus tear with Baker's cyst present in the posterior knee   I personally reviewed and interpreted the radiographs.   Assessment:   62 y.o. female here for follow-up of right knee pain.  Cortisone injection on 10/27/2023 has given her significant relief.   X-rays and MRI have demonstrated tricompartmental osteoarthritis which is likely a contributor to her symptoms however MRI does also just presence of a small meniscus tear.  She does continue to have some mild mechanical symptoms although little to no pain.  I have recommended we continue to monitor this and would recommending avoiding deep flexion of the knee as this will likely exacerbate symptoms.  She can plan to return as needed.  Plan :    -Return to clinic as needed     I personally saw and evaluated the patient, and participated in the management and treatment plan.  Hazle Nordmann, PA-C Orthopedics

## 2023-11-27 ENCOUNTER — Ambulatory Visit (HOSPITAL_BASED_OUTPATIENT_CLINIC_OR_DEPARTMENT_OTHER): Payer: BC Managed Care – PPO | Attending: Orthopaedic Surgery | Admitting: Physical Therapy

## 2023-11-27 ENCOUNTER — Encounter (HOSPITAL_BASED_OUTPATIENT_CLINIC_OR_DEPARTMENT_OTHER): Payer: Self-pay | Admitting: Physical Therapy

## 2023-11-27 ENCOUNTER — Other Ambulatory Visit: Payer: Self-pay

## 2023-11-27 DIAGNOSIS — M5459 Other low back pain: Secondary | ICD-10-CM | POA: Insufficient documentation

## 2023-11-27 DIAGNOSIS — G8929 Other chronic pain: Secondary | ICD-10-CM | POA: Diagnosis not present

## 2023-11-27 DIAGNOSIS — M5442 Lumbago with sciatica, left side: Secondary | ICD-10-CM | POA: Insufficient documentation

## 2023-11-27 DIAGNOSIS — M5441 Lumbago with sciatica, right side: Secondary | ICD-10-CM | POA: Insufficient documentation

## 2023-11-27 NOTE — Therapy (Signed)
OUTPATIENT PHYSICAL THERAPY EVALUATION   Patient Name: Priscilla Underwood MRN: [id number] DOB:Mar 23, 1961, 62 y.o., female Today's Date: 11/27/2023  END OF SESSION:  PT End of Session - 11/27/23 1147     Visit Number 1    Number of Visits 25    Date for PT Re-Evaluation 02/20/23    Authorization Type BCBS no VL    PT Start Time 1104    PT Stop Time 1145    PT Time Calculation (min) 41 min    Activity Tolerance Patient tolerated treatment well    Behavior During Therapy WFL for tasks assessed/performed             Past Medical History:  Diagnosis Date   Arthritis    osteo arthritis   Depression    Fatty liver    Fibromyalgia    GERD (gastroesophageal reflux disease)    PONV (postoperative nausea and vomiting)    Past Surgical History:  Procedure Laterality Date   71 HOUR PH STUDY N/A 12/14/2019   Procedure: 24 HOUR PH STUDY;  Surgeon: Napoleon Form, MD;  Location: [facility];  Service: Endoscopy;  Laterality: N/A;   BUNIONECTOMY  2004   CARPAL TUNNEL RELEASE Left 06/15/2015   Procedure: LEFT CARPAL TUNNEL RELEASE;  Surgeon: Dominica Severin, MD;  Location: [facility];  Service: Orthopedics;  Laterality: Left;   CARPAL TUNNEL RELEASE Right 08/31/2015   Procedure: RIGHT LIMITED OPEN CARPAL TUNNEL RELEASE;  Surgeon: Dominica Severin, MD;  Location: [facility];  Service: Orthopedics;  Laterality: Right;   ESOPHAGEAL MANOMETRY N/A 12/14/2019   Procedure: ESOPHAGEAL MANOMETRY (EM);  Surgeon: Napoleon Form, MD;  Location: [facility];  Service: Endoscopy;  Laterality: N/A;  DR. Orvan Falconer   LIPOMA EXCISION Right 05/28/2018   Procedure: EXCISION LIPOMA OF SCALP;  Surgeon: Osborn Coho, MD;  Location: [facility];  Service: ENT;  Laterality: Right;   SHOULDER SURGERY  2010, 2008   TARSAL TUNNEL RELEASE Left 2012   TUBAL LIGATION  1985   Patient Active Problem List   Diagnosis Date Noted   Gastroesophageal reflux disease     Globus sensation    Belching    Lipoma of scalp 05/28/2018    REFERRING PROVIDER:  Huel Cote, MD    REFERRING DIAG:  M54.42,M54.41,G89.29 (ICD-10-CM) - Chronic bilateral low back pain with bilateral sciatica    Pelvic floor, R hip/glute program, SI joint program   Rationale for Evaluation and Treatment: Rehabilitation  THERAPY DIAG:  Other low back pain  ONSET DATE: many years ago   SUBJECTIVE:  SUBJECTIVE STATEMENT: Rubbing rt SIJ- I have had trouble with this spot for years. Feels like there is a knot when I lay on the floor. SIJ injection was very helpful, I stay very tight on the right side even with stretching and massage. Driving would be awful, I go to [place] a lot. About 45 min into trip the spot hurts so bad I have to get out of the car- the injection did help this.  May years I had 2 falls when I fell on my buttock.  A few years ago, it got to where by butt cheeks would kill me when I bent over.  Years ago was dx with fibro. Take voltaren daily since then.   PERTINENT HISTORY:  fibromyalgia  PAIN:  Are you having pain? Yes: NPRS scale: no Pain location: Rt SIJ Pain description: tight, knot Aggravating factors: sitting in car Relieving factors: massage   PRECAUTIONS:  None  RED FLAGS: None   WEIGHT BEARING RESTRICTIONS:  No  FALLS:  Has patient fallen in last 6 months? 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  DIAGNOSTIC FINDINGS:  Knee MRI 12/14 in imaging tab  PATIENT SURVEYS:  FOTO 53  COGNITIVE STATUS: Within functional limits for tasks  assessed   SENSATION: WFL   GAIT: Comments: Rt trendelenburg   Body Part #1 Hip  PALPATION: Eval: limited Lt SIJ mobility   MMT (lb) Right 12/20 eval Left 12/20 eval  Hip flexion- seated 30.8 21.8  Hip extension    Hip abduction- sidelying 29 25.3  Hip adduction     (Blank rows = not tested)                                                                                                                               TREATMENT:  Treatment                            eval 12/20:  Trigger Point Dry Needling  Subsequent Treatment: Instructions reviewed, if requested by the patient, prior to subsequent dry needling treatment.   Patient Verbal Consent Given: Yes Education Handout Provided: No has had DN Muscles Treated: Rt glut med/max Electrical Stimulation Performed: No Treatment Response/Outcome: decreased spasm    Lt SIJ PA spring mobs 1 layer heel lift in right shoe    PATIENT EDUCATION:  Education details: Anatomy of condition, POC, HEP, exercise form/rationale Person educated: Patient Education method: Explanation, Demonstration, Tactile cues, Verbal cues, and Handouts Education comprehension: verbalized understanding, returned demonstration, verbal cues required, tactile cues required, and needs further education  HOME EXERCISE PROGRAM: Wear lift, ball bw knees when seated   ASSESSMENT:  CLINICAL IMPRESSION: Patient is a 62 y.o. F who was  seen today for physical therapy evaluation and treatment for chronic Rt SIJ of insidious onset. Able to improve Lt SIJ mobility and decrease Rt glut spasm which demonstrated LLD. Discrepancy is minimal so I do not think she will need the lift in her shoe for long. She is active so I asked that she begin by continuing her activity, wear lift and hold something between her knees when seated. She will message me in 2-3 days regarding outcome of the heel lift. Will cont to benefit from skilled PT to address limitations and  reach long term functional goals.    REHAB POTENTIAL: Good  CLINICAL DECISION MAKING: Stable/uncomplicated  EVALUATION COMPLEXITY: Low   GOALS: Goals reviewed with patient? Yes  SHORT TERM GOALS: Target date: 12/19/23  Determine need for heel lift Baseline: Goal status: INITIAL  2.  Good mobility maintained in Lt SIJ between sessions Baseline:  Goal status: INITIAL   LONG TERM GOALS: Target date: POC date  Meet FOTO goal Baseline:  Goal status: INITIAL  2.  Ambulation without trendelenburg Baseline:  Goal status: INITIAL  3.  Drive to [place] without need for stop due to pain Baseline:  Goal status: INITIAL  4.  Forward flexion without burning in buttocks Baseline:  Goal status: INITIAL  5.  Independent in long term HEP Baseline:  Goal status: INITIAL    PLAN:  PT FREQUENCY: 1-2x/week  PT DURATION: POC date  PLANNED INTERVENTIONS: 97164- PT Re-evaluation, 97110-Therapeutic exercises, 97530- Therapeutic activity, 97112- Neuromuscular re-education, 97535- Self Care, 16109- Manual therapy, [phone]- Gait training, [phone]- Aquatic Therapy, Patient/Family education, Balance training, Stair training, Taping, Dry Needling, Joint mobilization, Spinal mobilization, Cryotherapy, and Moist heat.  PLAN FOR NEXT SESSION: progress manual PRN, lumbopelvic stability, hip hinge  Valissa Lyvers C. Kanon Colunga PT, DPT 11/27/23 3:10 PM

## 2023-12-10 ENCOUNTER — Ambulatory Visit (HOSPITAL_BASED_OUTPATIENT_CLINIC_OR_DEPARTMENT_OTHER): Payer: BC Managed Care – PPO | Attending: Orthopaedic Surgery | Admitting: Physical Therapy

## 2023-12-10 ENCOUNTER — Ambulatory Visit (HOSPITAL_BASED_OUTPATIENT_CLINIC_OR_DEPARTMENT_OTHER): Payer: Self-pay | Admitting: Physical Therapy

## 2023-12-10 ENCOUNTER — Encounter (HOSPITAL_BASED_OUTPATIENT_CLINIC_OR_DEPARTMENT_OTHER): Payer: Self-pay

## 2023-12-10 ENCOUNTER — Encounter (HOSPITAL_BASED_OUTPATIENT_CLINIC_OR_DEPARTMENT_OTHER): Payer: Self-pay | Admitting: Physical Therapy

## 2023-12-10 DIAGNOSIS — M5459 Other low back pain: Secondary | ICD-10-CM | POA: Diagnosis not present

## 2023-12-10 NOTE — Therapy (Signed)
 OUTPATIENT PHYSICAL THERAPY EVALUATION   Patient Name: DOYNE ELLINGER MRN: [id number] DOB:Apr 22, 1961, 63 y.o., female Today's Date: 12/11/2023  END OF SESSION:  PT End of Session - 12/10/23 1517     Visit Number 2    Number of Visits 25    Date for PT Re-Evaluation 02/20/23    Authorization Type BCBS no VL    PT Start Time 1516    PT Stop Time 1555    PT Time Calculation (min) 39 min    Activity Tolerance Patient tolerated treatment well    Behavior During Therapy WFL for tasks assessed/performed              Past Medical History:  Diagnosis Date   Arthritis    osteo arthritis   Depression    Fatty liver    Fibromyalgia    GERD (gastroesophageal reflux disease)    PONV (postoperative nausea and vomiting)    Past Surgical History:  Procedure Laterality Date   10 HOUR PH STUDY N/A 12/14/2019   Procedure: 24 HOUR PH STUDY;  Surgeon: Shila Gustav GAILS, MD;  Location: [facility];  Service: Endoscopy;  Laterality: N/A;   BUNIONECTOMY  2004   CARPAL TUNNEL RELEASE Left 06/15/2015   Procedure: LEFT CARPAL TUNNEL RELEASE;  Surgeon: Elsie Mussel, MD;  Location: [facility];  Service: Orthopedics;  Laterality: Left;   CARPAL TUNNEL RELEASE Right 08/31/2015   Procedure: RIGHT LIMITED OPEN CARPAL TUNNEL RELEASE;  Surgeon: Elsie Mussel, MD;  Location: [facility];  Service: Orthopedics;  Laterality: Right;   ESOPHAGEAL MANOMETRY N/A 12/14/2019   Procedure: ESOPHAGEAL MANOMETRY (EM);  Surgeon: Shila Gustav GAILS, MD;  Location: [facility];  Service: Endoscopy;  Laterality: N/A;  DR. EDA   LIPOMA EXCISION Right 05/28/2018   Procedure: EXCISION LIPOMA OF SCALP;  Surgeon: Mable Lenis, MD;  Location: [facility];  Service: ENT;  Laterality: Right;   SHOULDER SURGERY  2010, 2008   TARSAL TUNNEL RELEASE Left 2012   TUBAL LIGATION  1985   Patient Active Problem List   Diagnosis Date Noted   Gastroesophageal reflux disease     Globus sensation    Belching    Lipoma of scalp 05/28/2018    REFERRING PROVIDER:  Genelle Standing, MD    REFERRING DIAG:  M54.42,M54.41,G89.29 (ICD-10-CM) - Chronic bilateral low back pain with bilateral sciatica    Pelvic floor, R hip/glute program, SI joint program   Rationale for Evaluation and Treatment: Rehabilitation  THERAPY DIAG:  Other low back pain  ONSET DATE: many years ago   SUBJECTIVE:  SUBJECTIVE STATEMENT: I have been extra tight but I have been so busy and not stretching/exercising as much.   Eval: Rubbing rt SIJ- I have had trouble with this spot for years. Feels like there is a knot when I lay on the floor. SIJ injection was very helpful, I stay very tight on the right side even with stretching and massage. Driving would be awful, [place] go to [place] a lot. About 45 min into trip the spot hurts so bad I have to get out of the car- the injection did help this.  May years I had 2 falls when I fell on my buttock.  A few years ago, it got to where by butt cheeks would kill me when I bent over.  Years ago was dx with fibro. Take voltaren  daily since then.   PERTINENT HISTORY:  fibromyalgia  PAIN:  Are you having pain? Yes: NPRS scale: no Pain location: Rt SIJ Pain description: tight, knot Aggravating factors: sitting in car Relieving factors: massage   PRECAUTIONS:  None  RED FLAGS: None   WEIGHT BEARING RESTRICTIONS:  No  FALLS:  Has patient fallen in last 6 months? 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  DIAGNOSTIC FINDINGS:  Knee MRI 12/14 in imaging  tab  PATIENT SURVEYS:  FOTO 53  COGNITIVE STATUS: Within functional limits for tasks assessed   SENSATION: WFL   GAIT: Comments: Rt trendelenburg   Body Part #1 Hip  PALPATION: Eval: limited Lt SIJ mobility   MMT (lb) Right 12/20 eval Left 12/20 eval  Hip flexion- seated 30.8 21.8  Hip extension    Hip abduction- sidelying 29 25.3  Hip adduction     (Blank rows = not tested)                                                                                                                               TREATMENT:  Treatment                            1/2:  Supine rotation ITB stretch Supine piriformis stretch Supine HS stretch with strap Ab set with rib cage flare reduction Bridge with ball bw knees- cues for core Prone STM Lt glutes, manual stretch to lumbar spine Qped leg ext- focusing on Lt with central core stability   Treatment                            eval 12/20:  Trigger Point Dry Needling  Subsequent Treatment: Instructions reviewed, if requested by the patient, prior to subsequent dry needling treatment.   Patient Verbal Consent Given: Yes Education Handout Provided: No has had DN  Muscles Treated: Rt glut med/max Electrical Stimulation Performed: No Treatment Response/Outcome: decreased spasm    Lt SIJ PA spring mobs 1 layer heel lift in right shoe    PATIENT EDUCATION:  Education details: Anatomy of condition, POC, HEP, exercise form/rationale Person educated: Patient Education method: Explanation, Demonstration, Tactile cues, Verbal cues, and Handouts Education comprehension: verbalized understanding, returned demonstration, verbal cues required, tactile cues required, and needs further education  HOME EXERCISE PROGRAM: Wear lift, ball bw knees when seated  [id number]   ASSESSMENT:  CLINICAL IMPRESSION: Progressed positioning into quadruped and tall kneeling for core engagement.  Patient has done well in learning to engage  appropriately and will continue to challenge strength with coordinated core control.  Eval: Patient is a 63 y.o. F who was seen today for physical therapy evaluation and treatment for chronic Rt SIJ of insidious onset. Able to improve Lt SIJ mobility and decrease Rt glut spasm which demonstrated LLD. Discrepancy is minimal so I do not think she will need the lift in her shoe for long. She is active so I asked that she begin by continuing her activity, wear lift and hold something between her knees when seated. She will message me in 2-3 days regarding outcome of the heel lift. Will cont to benefit from skilled PT to address limitations and reach long term functional goals.    REHAB POTENTIAL: Good  CLINICAL DECISION MAKING: Stable/uncomplicated  EVALUATION COMPLEXITY: Low   GOALS: Goals reviewed with patient? Yes  SHORT TERM GOALS: Target date: 12/19/23  Determine need for heel lift Baseline: Goal status: INITIAL  2.  Good mobility maintained in Lt SIJ between sessions Baseline:  Goal status: INITIAL   LONG TERM GOALS: Target date: POC date  Meet FOTO goal Baseline:  Goal status: INITIAL  2.  Ambulation without trendelenburg Baseline:  Goal status: INITIAL  3.  Drive [place] without need for stop due to pain Baseline:  Goal status: INITIAL  4.  Forward flexion without burning in buttocks Baseline:  Goal status: INITIAL  5.  Independent in long term HEP Baseline:  Goal status: INITIAL    PLAN:  PT FREQUENCY: 1-2x/week  PT DURATION: POC date  PLANNED INTERVENTIONS: 97164- PT Re-evaluation, 97110-Therapeutic exercises, 97530- Therapeutic activity, 97112- Neuromuscular re-education, 97535- Self Care, 02859- Manual therapy, [phone]- Gait training, [phone]- Aquatic Therapy, Patient/Family education, Balance training, Stair training, Taping, Dry Needling, Joint mobilization, Spinal mobilization, Cryotherapy, and Moist heat.  PLAN FOR NEXT SESSION: progress manual PRN,  lumbopelvic stability, hip hinge  Colby Reels C. Jaina Morin PT, DPT 12/11/23 8:42 AM

## 2023-12-15 DIAGNOSIS — M1991 Primary osteoarthritis, unspecified site: Secondary | ICD-10-CM | POA: Diagnosis not present

## 2023-12-15 DIAGNOSIS — Z79899 Other long term (current) drug therapy: Secondary | ICD-10-CM | POA: Diagnosis not present

## 2023-12-15 DIAGNOSIS — M797 Fibromyalgia: Secondary | ICD-10-CM | POA: Diagnosis not present

## 2023-12-17 ENCOUNTER — Encounter (HOSPITAL_BASED_OUTPATIENT_CLINIC_OR_DEPARTMENT_OTHER): Payer: Self-pay | Admitting: Physical Therapy

## 2023-12-17 ENCOUNTER — Ambulatory Visit (HOSPITAL_BASED_OUTPATIENT_CLINIC_OR_DEPARTMENT_OTHER): Payer: BC Managed Care – PPO | Admitting: Physical Therapy

## 2023-12-17 DIAGNOSIS — M5459 Other low back pain: Secondary | ICD-10-CM | POA: Diagnosis not present

## 2023-12-17 NOTE — Therapy (Signed)
 OUTPATIENT PHYSICAL THERAPY EVALUATION   Patient Name: TIFFANNY LAMARCHE MRN: [id number] DOB:[birth year], 63 y.o., female Today's Date: 12/17/2023  END OF SESSION:  PT End of Session - 12/17/23 1603     Visit Number 3    Number of Visits 25    Date for PT Re-Evaluation 02/20/23    Authorization Type BCBS no VL    PT Start Time 1602    PT Stop Time 1644    PT Time Calculation (min) 42 min    Activity Tolerance Patient tolerated treatment well    Behavior During Therapy WFL for tasks assessed/performed               Past Medical History:  Diagnosis Date   Arthritis    osteo arthritis   Depression    Fatty liver    Fibromyalgia    GERD (gastroesophageal reflux disease)    PONV (postoperative nausea and vomiting)    Past Surgical History:  Procedure Laterality Date   56 HOUR PH STUDY N/A 12/14/2019   Procedure: 24 HOUR PH STUDY;  Surgeon: Shila Gustav GAILS, MD;  Location: [facility];  Service: Endoscopy;  Laterality: N/A;   BUNIONECTOMY  2004   CARPAL TUNNEL RELEASE Left 06/15/2015   Procedure: LEFT CARPAL TUNNEL RELEASE;  Surgeon: Elsie Mussel, MD;  Location: [facility];  Service: Orthopedics;  Laterality: Left;   CARPAL TUNNEL RELEASE Right 08/31/2015   Procedure: RIGHT LIMITED OPEN CARPAL TUNNEL RELEASE;  Surgeon: Elsie Mussel, MD;  Location: [facility];  Service: Orthopedics;  Laterality: Right;   ESOPHAGEAL MANOMETRY N/A 12/14/2019   Procedure: ESOPHAGEAL MANOMETRY (EM);  Surgeon: Shila Gustav GAILS, MD;  Location: [facility];  Service: Endoscopy;  Laterality: N/A;  DR. EDA   LIPOMA EXCISION Right 05/28/2018   Procedure: EXCISION LIPOMA OF SCALP;  Surgeon: Mable Lenis, MD;  Location: [facility];  Service: ENT;  Laterality: Right;   SHOULDER SURGERY  2010, 2008   TARSAL TUNNEL RELEASE Left 2012   TUBAL LIGATION  1985   Patient Active Problem List   Diagnosis Date Noted   Gastroesophageal reflux disease     Globus sensation    Belching    Lipoma of scalp 05/28/2018    REFERRING PROVIDER:  Genelle Standing, MD    REFERRING DIAG:  M54.42,M54.41,G89.29 (ICD-10-CM) - Chronic bilateral low back pain with bilateral sciatica    Pelvic floor, R hip/glute program, SI joint program   Rationale for Evaluation and Treatment: Rehabilitation  THERAPY DIAG:  Other low back pain  ONSET DATE: many years ago   SUBJECTIVE:  SUBJECTIVE STATEMENT: Kept the 65 yo and 65yo and I think the weather flared my fibromyalgia. Had a 1.5 hr massage and had a knot (pointed to region of obturator).   Eval: Rubbing rt SIJ- I have had trouble with this spot for years. Feels like there is a knot when I lay on the floor. SIJ injection was very helpful, I stay very tight on the right side even with stretching and massage. Driving would be awful, [place] go to [place] a lot. About 45 min into trip the spot hurts so bad I have to get out of the car- the injection did help this.  May years I had 2 falls when I fell on my buttock.  A few years ago, it got to where by butt cheeks would kill me when I bent over.  Years ago was dx with fibro. Take voltaren  daily since then.   PERTINENT HISTORY:  fibromyalgia  PAIN:  Are you having pain? Yes: NPRS scale: no Pain location: Rt SIJ Pain description: tight, knot Aggravating factors: sitting in car Relieving factors: massage   PRECAUTIONS:  None  RED FLAGS: None   WEIGHT BEARING RESTRICTIONS:  No  FALLS:  Has patient fallen in last 6 months? 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  DIAGNOSTIC FINDINGS:  Knee MRI 12/14 in imaging tab  PATIENT SURVEYS:  FOTO 53  COGNITIVE STATUS: Within functional limits for tasks assessed   SENSATION: WFL   GAIT: Comments: Rt trendelenburg   Body Part #1 Hip  PALPATION: Eval: limited Lt SIJ mobility   MMT (lb) Right 12/20 eval Left 12/20 eval  Hip flexion- seated 30.8 21.8  Hip extension    Hip abduction- sidelying 29 25.3  Hip adduction     (Blank rows = not tested)                                                                                                                               TREATMENT:  Treatment                            12/17/23:  Manual: Lt SIJ upper & mid quadrant PA spring mob Assisted child pose to pull back on Lt innominate Qped ab set, + rocking 90/90 bridge on wall with ball bw knees Ab set + 90/90 ball bw knees, feet lift away from wall 90/90 add ball squeeze, feet on wall Self obturator release with rolled washcloth Tall kneel glut set, + bridge with green tband resist, +OH reach, upper body hinge back   Treatment  1/2:  Supine rotation ITB stretch Supine piriformis stretch Supine HS stretch with strap Ab set with rib cage flare reduction Bridge with ball bw knees- cues for core Prone STM Lt glutes, manual stretch to lumbar spine Qped leg ext- focusing on Lt with central core stability   Treatment                            eval 12/20:  Trigger Point Dry Needling  Subsequent Treatment: Instructions reviewed, if requested by the patient, prior to subsequent dry needling treatment.   Patient Verbal Consent Given: Yes Education Handout Provided: No has had DN Muscles Treated: Rt glut med/max Electrical Stimulation Performed: No Treatment Response/Outcome: decreased spasm    Lt SIJ PA spring mobs 1 layer heel lift in right shoe    PATIENT EDUCATION:  Education details: Teacher, Music of condition, POC, HEP, exercise  form/rationale Person educated: Patient Education method: Explanation, Demonstration, Tactile cues, Verbal cues, and Handouts Education comprehension: verbalized understanding, returned demonstration, verbal cues required, tactile cues required, and needs further education  HOME EXERCISE PROGRAM: Wear lift, ball bw knees when seated  [id number]   ASSESSMENT:  CLINICAL IMPRESSION: Arrived with lack of motion of Lt SIJ in trunk flexion- notable limitations on Lt side. Limited engagement of abdominal wall due to dominance to post chain. Will trial aquatic PT in her coming appointments.   Eval: Patient is a 63 y.o. F who was seen today for physical therapy evaluation and treatment for chronic Rt SIJ of insidious onset. Able to improve Lt SIJ mobility and decrease Rt glut spasm which demonstrated LLD. Discrepancy is minimal so I do not think she will need the lift in her shoe for long. She is active so I asked that she begin by continuing her activity, wear lift and hold something between her knees when seated. She will message me in 2-3 days regarding outcome of the heel lift. Will cont to benefit from skilled PT to address limitations and reach long term functional goals.    REHAB POTENTIAL: Good  CLINICAL DECISION MAKING: Stable/uncomplicated  EVALUATION COMPLEXITY: Low   GOALS: Goals reviewed with patient? Yes  SHORT TERM GOALS: Target date: 12/19/23  Determine need for heel lift Baseline: Goal status: INITIAL  2.  Good mobility maintained in Lt SIJ between sessions Baseline:  Goal status: INITIAL   LONG TERM GOALS: Target date: POC date  Meet FOTO goal Baseline:  Goal status: INITIAL  2.  Ambulation without trendelenburg Baseline:  Goal status: INITIAL  3.  [place] without need for stop due to pain Baseline:  Goal status: INITIAL  4.  Forward flexion without burning in buttocks Baseline:  Goal status: INITIAL  5.  Independent in long term HEP Baseline:   Goal status: INITIAL    PLAN:  PT FREQUENCY: 1-2x/week  PT DURATION: POC date  PLANNED INTERVENTIONS: 97164- PT Re-evaluation, 97110-Therapeutic exercises, 97530- Therapeutic activity, 97112- Neuromuscular re-education, 97535- Self Care, 02859- Manual therapy, [phone]- Gait training, [phone]- Aquatic Therapy, Patient/Family education, Balance training, Stair training, Taping, Dry Needling, Joint mobilization, Spinal mobilization, Cryotherapy, and Moist heat.  PLAN FOR NEXT SESSION: progress manual PRN, lumbopelvic stability, hip hinge  Colbey Wirtanen C. Iker Nuttall PT, DPT 12/17/23 4:51 PM

## 2023-12-22 ENCOUNTER — Encounter (HOSPITAL_BASED_OUTPATIENT_CLINIC_OR_DEPARTMENT_OTHER): Payer: Self-pay | Admitting: Physical Therapy

## 2023-12-22 ENCOUNTER — Ambulatory Visit (HOSPITAL_BASED_OUTPATIENT_CLINIC_OR_DEPARTMENT_OTHER): Payer: BC Managed Care – PPO | Admitting: Physical Therapy

## 2023-12-22 DIAGNOSIS — M5459 Other low back pain: Secondary | ICD-10-CM | POA: Diagnosis not present

## 2023-12-22 NOTE — Therapy (Signed)
 OUTPATIENT PHYSICAL THERAPY EVALUATION   Patient Name: Priscilla Underwood MRN: [id number] DOB:1961/03/11, 63 y.o., female Today's Date: 12/22/2023  END OF SESSION:  PT End of Session - 12/22/23 1017     Visit Number 4    Number of Visits 25    Date for PT Re-Evaluation 02/20/23    Authorization Type BCBS no VL    PT Start Time 1016    PT Stop Time 1058    PT Time Calculation (min) 42 min    Activity Tolerance Patient tolerated treatment well    Behavior During Therapy WFL for tasks assessed/performed               Past Medical History:  Diagnosis Date   Arthritis    osteo arthritis   Depression    Fatty liver    Fibromyalgia    GERD (gastroesophageal reflux disease)    PONV (postoperative nausea and vomiting)    Past Surgical History:  Procedure Laterality Date   68 HOUR PH STUDY N/A 12/14/2019   Procedure: 24 HOUR PH STUDY;  Surgeon: Shila Gustav GAILS, MD;  Location: [facility];  Service: Endoscopy;  Laterality: N/A;   BUNIONECTOMY  2004   CARPAL TUNNEL RELEASE Left 06/15/2015   Procedure: LEFT CARPAL TUNNEL RELEASE;  Surgeon: Elsie Mussel, MD;  Location: [facility];  Service: Orthopedics;  Laterality: Left;   CARPAL TUNNEL RELEASE Right 08/31/2015   Procedure: RIGHT LIMITED OPEN CARPAL TUNNEL RELEASE;  Surgeon: Elsie Mussel, MD;  Location: [facility];  Service: Orthopedics;  Laterality: Right;   ESOPHAGEAL MANOMETRY N/A 12/14/2019   Procedure: ESOPHAGEAL MANOMETRY (EM);  Surgeon: Shila Gustav GAILS, MD;  [facility];  Service: Endoscopy;  Laterality: N/A;  DR. EDA   LIPOMA EXCISION Right 05/28/2018   Procedure: EXCISION LIPOMA OF SCALP;  Surgeon: Mable Lenis, MD;  Location: [facility];  Service: ENT;  Laterality: Right;   SHOULDER SURGERY  2010, 2008   TARSAL TUNNEL RELEASE Left 2012   TUBAL LIGATION  1985   Patient Active Problem List   Diagnosis Date Noted   Gastroesophageal reflux disease     Globus sensation    Belching    Lipoma of scalp 05/28/2018    REFERRING PROVIDER:  Genelle Standing, MD    REFERRING DIAG:  M54.42,M54.41,G89.29 (ICD-10-CM) - Chronic bilateral low back pain with bilateral sciatica    Pelvic floor, R hip/glute program, SI joint program   Rationale for Evaluation and Treatment: Rehabilitation  THERAPY DIAG:  Other low back pain  ONSET DATE: many years ago   SUBJECTIVE:  SUBJECTIVE STATEMENT: 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 That spot inmy lower back is hurting.   Eval: Rubbing rt SIJ- I have had trouble with this spot for years. Feels like there is a knot when I lay on the floor. SIJ injection was very helpful, I stay very tight on the right side even with stretching and massage. Driving would be awful, [place] go to [place] a lot. About 45 min into trip the spot hurts so bad I have to get out of the car- the injection did help this.  May years I had 2 falls when I fell on my buttock.  A few years ago, it got to where by butt cheeks would kill me when I bent over.  Years ago was dx with fibro. Take voltaren  daily since then.   PERTINENT HISTORY:  fibromyalgia  PAIN:  Are you having pain? Yes: NPRS scale: no Pain location: Rt SIJ Pain description: tight, knot Aggravating factors: sitting in car Relieving factors: massage   PRECAUTIONS:  None  RED FLAGS: None   WEIGHT BEARING RESTRICTIONS:  No  FALLS:  Has patient fallen in last 6 months? 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  DIAGNOSTIC FINDINGS:   Knee MRI 12/14 in imaging tab  PATIENT SURVEYS:  FOTO 53  COGNITIVE STATUS: Within functional limits for tasks assessed   SENSATION: WFL   GAIT: Comments: Rt trendelenburg   Body Part #1 Hip  PALPATION: Eval: limited Lt SIJ mobility   MMT (lb) Right 12/20 eval Left 12/20 eval  Hip flexion- seated 30.8 21.8  Hip extension    Hip abduction- sidelying 29 25.3  Hip adduction     (Blank rows = not tested)                                                                                                                               TREATMENT:  Treatment                            12/22/23:  Trigger Point Dry Needling  Subsequent Treatment: Instructions provided previously at initial dry needling treatment.   Patient Verbal Consent Given: Yes Education Handout Provided: Previously Provided Muscles Treated: Rt glut max, med, min, piriformis Electrical Stimulation Performed: No Treatment Response/Outcome: twitch with decreased concordant symptoms  Hesch self-correction for Lt post innominate Bridge with adduction on heels- avoid touching table in lower Qped ab set- rocking to child pose Primal push up Hip hinge with dowel- seated and standing, in sit<>stand   Treatment  12/17/23:  Manual: Lt SIJ upper & mid quadrant PA spring mob Assisted child pose to pull back on Lt innominate Qped ab set, + rocking 90/90 bridge on wall with ball bw knees Ab set + 90/90 ball bw knees, feet lift away from wall 90/90 add ball squeeze, feet on wall Self obturator release with rolled washcloth Tall kneel glut set, + bridge with green tband resist, +OH reach, upper body hinge back   Treatment                            1/2:  Supine rotation ITB stretch Supine piriformis stretch Supine HS stretch with strap Ab set with rib cage flare reduction Bridge with ball bw knees- cues for core Prone STM Lt glutes, manual stretch to lumbar spine Qped leg ext-  focusing on Lt with central core stability     PATIENT EDUCATION:  Education details: Anatomy of condition, POC, HEP, exercise form/rationale Person educated: Patient Education method: Explanation, Demonstration, Tactile cues, Verbal cues, and Handouts Education comprehension: verbalized understanding, returned demonstration, verbal cues required, tactile cues required, and needs further education  HOME EXERCISE PROGRAM: Wear lift, ball bw knees when seated  [id number]   ASSESSMENT:  CLINICAL IMPRESSION: Able to reduce concordant pain with manual therapy. Notable Lt post rotation corrected with Hesch. notable weakness in Lt hip in [facility]. Improved hip hinge with dowel and will progress at next appointment.   Eval: Patient is a 63 y.o. F who was seen today for physical therapy evaluation and treatment for chronic Rt SIJ of insidious onset. Able to improve Lt SIJ mobility and decrease Rt glut spasm which demonstrated LLD. Discrepancy is minimal so I do not think she will need the lift in her shoe for long. She is active so I asked that she begin by continuing her activity, wear lift and hold something between her knees when seated. She will message me in 2-3 days regarding outcome of the heel lift. Will cont to benefit from skilled PT to address limitations and reach long term functional goals.    REHAB POTENTIAL: Good  CLINICAL DECISION MAKING: Stable/uncomplicated  EVALUATION COMPLEXITY: Low   GOALS: Goals reviewed with patient? Yes  SHORT TERM GOALS: Target date: 12/19/23  Determine need for heel lift Baseline: Goal status: INITIAL  2.  Good mobility maintained in Lt SIJ between sessions Baseline:  Goal status: INITIAL   LONG TERM GOALS: Target date: POC date  Meet FOTO goal Baseline:  Goal status: INITIAL  2.  Ambulation without trendelenburg Baseline:  Goal status: INITIAL  3.  Drive [place] without need for stop due to pain Baseline:  Goal status: INITIAL  4.   Forward flexion without burning in buttocks Baseline:  Goal status: INITIAL  5.  Independent in long term HEP Baseline:  Goal status: INITIAL    PLAN:  PT FREQUENCY: 1-2x/week  PT DURATION: POC date  PLANNED INTERVENTIONS: 97164- PT Re-evaluation, 97110-Therapeutic exercises, 97530- Therapeutic activity, 97112- Neuromuscular re-education, 97535- Self Care, 02859- Manual therapy, [phone]- Gait training, [phone]- Aquatic Therapy, Patient/Family education, Balance training, Stair training, Taping, Dry Needling, Joint mobilization, Spinal mobilization, Cryotherapy, and Moist heat.  PLAN FOR NEXT SESSION: DN PRN, hip hinge review, hip abductor strengthening  Trelon Plush C. Melonee Gerstel PT, DPT 12/22/23 11:03 AM

## 2023-12-26 ENCOUNTER — Ambulatory Visit (HOSPITAL_BASED_OUTPATIENT_CLINIC_OR_DEPARTMENT_OTHER): Payer: BC Managed Care – PPO | Admitting: Physical Therapy

## 2023-12-26 ENCOUNTER — Encounter (HOSPITAL_BASED_OUTPATIENT_CLINIC_OR_DEPARTMENT_OTHER): Payer: Self-pay | Admitting: Physical Therapy

## 2023-12-26 DIAGNOSIS — M5459 Other low back pain: Secondary | ICD-10-CM | POA: Diagnosis not present

## 2023-12-26 NOTE — Therapy (Signed)
OUTPATIENT PHYSICAL THERAPY EVALUATION   Patient Name: Priscilla Underwood MRN: [id number] DOB:03/03/1961, 63 y.o., female Today's Date: 12/26/2023  END OF SESSION:  PT End of Session - 12/26/23 1107     Visit Number 5    Number of Visits 25    Date for PT Re-Evaluation 02/20/23    Authorization Type BCBS no VL    PT Start Time 0915    PT Stop Time 0958    PT Time Calculation (min) 43 min    Activity Tolerance Patient tolerated treatment well    Behavior During Therapy WFL for tasks assessed/performed                Past Medical History:  Diagnosis Date   Arthritis    osteo arthritis   Depression    Fatty liver    Fibromyalgia    GERD (gastroesophageal reflux disease)    PONV (postoperative nausea and vomiting)    Past Surgical History:  Procedure Laterality Date   31 HOUR PH STUDY N/A 12/14/2019   Procedure: 24 HOUR PH STUDY;  Surgeon: Napoleon Form, MD;  Location: [facility];  Service: Endoscopy;  Laterality: N/A;   BUNIONECTOMY  2004   CARPAL TUNNEL RELEASE Left 06/15/2015   Procedure: LEFT CARPAL TUNNEL RELEASE;  Surgeon: Dominica Severin, MD;  Location: [facility];  Service: Orthopedics;  Laterality: Left;   CARPAL TUNNEL RELEASE Right 08/31/2015   Procedure: RIGHT LIMITED OPEN CARPAL TUNNEL RELEASE;  Surgeon: Dominica Severin, MD;  Location: [facility];  Service: Orthopedics;  Laterality: Right;   ESOPHAGEAL MANOMETRY N/A 12/14/2019   Procedure: ESOPHAGEAL MANOMETRY (EM);  Surgeon: Napoleon Form, MD;  Location: [facility];  Service: Endoscopy;  Laterality: N/A;  DR. Orvan Falconer   LIPOMA EXCISION Right 05/28/2018   Procedure: EXCISION LIPOMA OF SCALP;  Surgeon: Osborn Coho, MD;  Location: [facility];  Service: ENT;  Laterality: Right;   SHOULDER SURGERY  2010, 2008   TARSAL TUNNEL RELEASE Left 2012   TUBAL LIGATION  1985   Patient Active Problem List   Diagnosis Date Noted   Gastroesophageal reflux disease     Globus sensation    Belching    Lipoma of scalp 05/28/2018    REFERRING PROVIDER:  Huel Cote, MD    REFERRING DIAG:  M54.42,M54.41,G89.29 (ICD-10-CM) - Chronic bilateral low back pain with bilateral sciatica    Pelvic floor, R hip/glute program, SI joint program   Rationale for Evaluation and Treatment: Rehabilitation  THERAPY DIAG:  Other low back pain  ONSET DATE: many years ago   SUBJECTIVE:  SUBJECTIVE STATEMENT: Today has been a bad day. That one spot on my back hurts really bad. I dont know what causes it to get like this, but I am getting very discouraged.   Eval: Rubbing rt SIJ- I have had trouble with this spot for years. Feels like there is a knot when I lay on the floor. SIJ injection was very helpful, I stay very tight on the right side even with stretching and massage. Driving would be awful, I go to [place] a lot. About 45 min into trip the spot hurts so bad I have to get out of the car- the injection did help this.  May years I had 2 falls when I fell on my buttock.  A few years ago, it got to where by butt cheeks would kill me when I bent over.  Years ago was dx with fibro. Take voltaren daily since then.   PERTINENT HISTORY:  fibromyalgia  PAIN:  Are you having pain? Yes: NPRS scale: no Pain location: Rt SIJ Pain description: tight, knot Aggravating factors: sitting in car Relieving factors: massage   PRECAUTIONS:  None  RED FLAGS: None   WEIGHT BEARING RESTRICTIONS:  No  FALLS:  Has patient fallen in last 6 months? 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  DIAGNOSTIC FINDINGS:  Knee MRI 12/14 in imaging tab  PATIENT SURVEYS:  FOTO 53  COGNITIVE STATUS: Within functional limits for tasks assessed   SENSATION: WFL   GAIT: Comments: Rt trendelenburg   Body Part #1 Hip  PALPATION: Eval: limited Lt SIJ mobility   MMT (lb) Right 12/20 eval Left 12/20 eval  Hip flexion- seated 30.8 21.8  Hip extension    Hip abduction- sidelying 29 25.3  Hip adduction     (Blank rows = not tested)                                                                                                                               TREATMENT:  Treatment                            12/26/23: Trigger Point Dry Needling  Subsequent Treatment: Instructions provided previously at initial dry needling treatment.   Patient Verbal Consent Given: Yes Education Handout Provided: Previously Provided Muscles Treated: Rt glut max, piriformis, R mulrifidi, L3-4 Electrical Stimulation Performed: No Treatment Response/Outcome: twitch with decreased concordant symptoms  Bridge with adduction on heels- avoid touching table in lower LAQ without weight 1 x10  STM and Tpr to "tender spot" on in mid pelvic/ coccyx region.  Modified thomas stretch to quads on R side.  MET x1 Child's pose.     Treatment  12/22/23:  Trigger Point Dry Needling  Subsequent Treatment: Instructions provided previously at initial dry needling treatment.   Patient Verbal Consent Given: Yes Education Handout Provided: Previously Provided Muscles Treated: Rt glut max, med, min, piriformis Electrical Stimulation Performed: No Treatment Response/Outcome: twitch with decreased concordant symptoms  Hesch self-correction for Lt post innominate Bridge with adduction on heels- avoid touching table in lower Qped ab set- rocking to child pose Primal push up Hip hinge with dowel- seated and standing, in sit<>stand   Treatment                             12/17/23:  Manual: Lt SIJ upper & mid quadrant PA spring mob Assisted child pose to pull back on Lt innominate Qped ab set, + rocking 90/90 bridge on wall with ball bw knees Ab set + 90/90 ball bw knees, feet lift away from wall 90/90 add ball squeeze, feet on wall Self obturator release with rolled washcloth Tall kneel glut set, + bridge with green tband resist, +OH reach, upper body hinge back   Treatment                            1/2:  Supine rotation ITB stretch Supine piriformis stretch Supine HS stretch with strap Ab set with rib cage flare reduction Bridge with ball bw knees- cues for core Prone STM Lt glutes, manual stretch to lumbar spine Qped leg ext- focusing on Lt with central core stability     PATIENT EDUCATION:  Education details: Anatomy of condition, POC, HEP, exercise form/rationale Person educated: Patient Education method: Explanation, Demonstration, Tactile cues, Verbal cues, and Handouts Education comprehension: verbalized understanding, returned demonstration, verbal cues required, tactile cues required, and needs further education  HOME EXERCISE PROGRAM: Wear lift, ball bw knees when seated  [email or web address]   ASSESSMENT:  CLINICAL IMPRESSION: Able to reduce concordant pain with manual therapy. Notable Lt post rotation. Pt with tension in her R quad with reports of giving way and weakness. She now wears a brace. Notable weakness in Lt hip in CKC. A lot of anatomy discussion this session. Plan to send pt for internal pelvic assessment. She reports a lot of discouragement at this time.    Eval: Patient is a 63 y.o. F who was seen today for physical therapy evaluation and treatment for chronic Rt SIJ of insidious onset. Able to improve Lt SIJ mobility and decrease Rt glut spasm which demonstrated LLD. Discrepancy is minimal so I do not think she will need the lift in her shoe for long. She is active so I asked that she begin by continuing her activity, wear lift  and hold something between her knees when seated. She will message me in 2-3 days regarding outcome of the heel lift. Will cont to benefit from skilled PT to address limitations and reach long term functional goals.    REHAB POTENTIAL: Good  CLINICAL DECISION MAKING: Stable/uncomplicated  EVALUATION COMPLEXITY: Low   GOALS: Goals reviewed with patient? Yes  SHORT TERM GOALS: Target date: 12/19/23  Determine need for heel lift Baseline: Goal status: INITIAL  2.  Good mobility maintained in Lt SIJ between sessions Baseline:  Goal status: INITIAL   LONG TERM GOALS: Target date: POC date  Meet FOTO goal Baseline:  Goal status: INITIAL  2.  Ambulation without trendelenburg Baseline:  Goal status: INITIAL  3.  Drive to [employer] without need for  stop due to pain Baseline:  Goal status: INITIAL  4.  Forward flexion without burning in buttocks Baseline:  Goal status: INITIAL  5.  Independent in long term HEP Baseline:  Goal status: INITIAL    PLAN:  PT FREQUENCY: 1-2x/week  PT DURATION: POC date  PLANNED INTERVENTIONS: 97164- PT Re-evaluation, 97110-Therapeutic exercises, 97530- Therapeutic activity, 97112- Neuromuscular re-education, 97535- Self Care, 16109- Manual therapy, [phone]- Gait training, [phone]- Aquatic Therapy, Patient/Family education, Balance training, Stair training, Taping, Dry Needling, Joint mobilization, Spinal mobilization, Cryotherapy, and Moist heat.  PLAN FOR NEXT SESSION: DN PRN, hip hinge review, hip abductor strengthening  Royal Hawthorn PT, DPT 12/26/23  11:18 AM

## 2023-12-29 ENCOUNTER — Encounter (HOSPITAL_BASED_OUTPATIENT_CLINIC_OR_DEPARTMENT_OTHER): Payer: Self-pay

## 2023-12-30 ENCOUNTER — Ambulatory Visit: Payer: BC Managed Care – PPO | Attending: Orthopaedic Surgery | Admitting: Physical Therapy

## 2023-12-30 DIAGNOSIS — M5459 Other low back pain: Secondary | ICD-10-CM | POA: Insufficient documentation

## 2023-12-30 NOTE — Therapy (Signed)
OUTPATIENT PHYSICAL THERAPY PELVIC FLOOR ASSESSMENT   Patient Name: Priscilla Underwood MRN: [id number] DOB:February 13, 1961, 63 y.o., female Today's Date: 12/30/2023  END OF SESSION:  PT End of Session - 12/30/23 1527     Visit Number 6    Number of Visits 25    Date for PT Re-Evaluation 02/20/23    Authorization Type BCBS no VL    PT Start Time 1530    PT Stop Time 1613    PT Time Calculation (min) 43 min    Activity Tolerance Patient tolerated treatment well    Behavior During Therapy WFL for tasks assessed/performed              Past Medical History:  Diagnosis Date   Arthritis    osteo arthritis   Depression    Fatty liver    Fibromyalgia    GERD (gastroesophageal reflux disease)    PONV (postoperative nausea and vomiting)    Past Surgical History:  Procedure Laterality Date   85 HOUR PH STUDY N/A 12/14/2019   Procedure: 24 HOUR PH STUDY;  Surgeon: Napoleon Form, MD;  Location: [facility];  Service: Endoscopy;  Laterality: N/A;   BUNIONECTOMY  2004   CARPAL TUNNEL RELEASE Left 06/15/2015   Procedure: LEFT CARPAL TUNNEL RELEASE;  Surgeon: Dominica Severin, MD;  Location: [facility];  Service: Orthopedics;  Laterality: Left;   CARPAL TUNNEL RELEASE Right 08/31/2015   Procedure: RIGHT LIMITED OPEN CARPAL TUNNEL RELEASE;  Surgeon: Dominica Severin, MD;  Location: [facility];  Service: Orthopedics;  Laterality: Right;   ESOPHAGEAL MANOMETRY N/A 12/14/2019   Procedure: ESOPHAGEAL MANOMETRY (EM);  Surgeon: Napoleon Form, MD;  Location: [facility];  Service: Endoscopy;  Laterality: N/A;  DR. Orvan Falconer   LIPOMA EXCISION Right 05/28/2018   Procedure: EXCISION LIPOMA OF SCALP;  Surgeon: Osborn Coho, MD;  Location: [facility];  Service: ENT;  Laterality: Right;   SHOULDER SURGERY  2010, 2008   TARSAL TUNNEL RELEASE Left 2012   TUBAL LIGATION  1985   Patient Active Problem List   Diagnosis Date Noted   Gastroesophageal  reflux disease    Globus sensation    Belching    Lipoma of scalp 05/28/2018    REFERRING PROVIDER:  Huel Cote, MD    REFERRING DIAG:  M54.42,M54.41,G89.29 (ICD-10-CM) - Chronic bilateral low back pain with bilateral sciatica    Pelvic floor, R hip/glute program, SI joint program   Rationale for Evaluation and Treatment: Rehabilitation  THERAPY DIAG:  Other low back pain  ONSET DATE: many years ago   SUBJECTIVE:  SUBJECTIVE STATEMENT: Right glute pain/ SIJ pain. Worse with driving, never gone. Attempted SIJ injection and did help a little but returned. Many many years ago did have two falls onto butt but x-rays were negative. Pain radiates into higher Rt side back to bra line.   BOWEL MOVEMENT: Pain with bowel movement: No Type of bowel movement:Type (Bristol Stool Scale) 4, Frequency daily, and Strain No Fully empty rectum: Yes:  Leakage: No Pads: No Fiber supplement: no  URINATION: Pain with urination: No Fully empty bladder: Yes:  Stream: Strong Urgency: Yes: sometimes after coffee Frequency: about an 1 hour Leakage: jumping, coughing Pads: No  INTERCOURSE: Pain with intercourse: not painful, but a little dry Ability to have vaginal penetration:  Yes:  Climax: harder to achieve  Marinoff Scale: 0/3  PREGNANCY: Vaginal deliveries 2 Tearing No C-section deliveries 0 Currently pregnant No  PROLAPSE: None    11/26/24: PERTINENT HISTORY:  fibromyalgia  PAIN:  Are you having pain? Yes: NPRS scale: no Pain location: Rt SIJ Pain description: tight, knot Aggravating factors: sitting in car Relieving factors: massage   PRECAUTIONS:  None  RED FLAGS: None   WEIGHT BEARING RESTRICTIONS:  No  FALLS:  Has patient fallen in last 6 months? No  OCCUPATION:   Retired form CA center at [employer]  PLOF:  Independent  PATIENT GOALS:  Bend without pain, drive, be active and flexible, work outside   OBJECTIVE:  Note: Objective measures were completed at Evaluation unless otherwise noted. 12/30/23 PALPATION:   General  no TTP at abdominals, tightness at Rt oblique, thoracic paraspinals, and piriformis                External Perineal Exam no TTP, WFL, mild dryness                             Internal Pelvic Floor TTP and tightness at Rt obturator   Patient confirms identification and approves PT to assess internal pelvic floor and treatment Yes  PELVIC MMT:   MMT eval  Vaginal 4/5, 8s, 7 reps  Internal Anal Sphincter   External Anal Sphincter   Puborectalis   Diastasis Recti   (Blank rows = not tested)        TONE: WFL  PROLAPSE: Not seen in hooklying    11/26/24 DIAGNOSTIC FINDINGS:  Knee MRI 12/14 in imaging tab  PATIENT SURVEYS:  FOTO 53  COGNITIVE STATUS: Within functional limits for tasks assessed   SENSATION: WFL   GAIT: Comments: Rt trendelenburg   Body Part #1 Hip  PALPATION: Eval: limited Lt SIJ mobility   MMT (lb) Right 12/20 eval Left 12/20 eval  Hip flexion- seated 30.8 21.8  Hip extension    Hip abduction- sidelying 29 25.3  Hip adduction     (Blank rows = not tested)  TREATMENT: 12/30/23 Manual: Patient consented to internal pelvic floor assessment and treatment vaginally this date and found to have TTP and tension at Rt obturator internus and trigger points tolerated manual of gentle stretching and release for improved mobility.    Exercises: Updated HEP for Rt obturator stretching and SIJ pain stretching. Pt completed x2 reps of all updated exercises.   Treatment                            eval 12/20:  Trigger Point Dry Needling  Subsequent Treatment:  Instructions reviewed, if requested by the patient, prior to subsequent dry needling treatment.   Patient Verbal Consent Given: Yes Education Handout Provided: No has had DN Muscles Treated: Rt glut med/max Electrical Stimulation Performed: No Treatment Response/Outcome: decreased spasm  Lt SIJ PA spring mobs 1 layer heel lift in right shoe    PATIENT EDUCATION:  Education details: Anatomy of condition, POC, HEP, exercise form/rationale Person educated: Patient Education method: Explanation, Demonstration, Tactile cues, Verbal cues, and Handouts Education comprehension: verbalized understanding, returned demonstration, verbal cues required, tactile cues required, and needs further education  HOME EXERCISE PROGRAM: Wear lift, ball bw knees when seated Access Code: [email or web address] URL: [email or web address] Date: 12/30/2023 Prepared by: [facility] - Outpatient Re[facility] - [facility]  Exercises - Supine ITB Stretch  - 1 x daily - 7 x weekly - 3 sets -  breaths hold - Supine Piriformis Stretch with Foot on Ground  - 1 x daily - 7 x weekly - 3 sets -  breaths hold - Supine Hamstring Stretch with Strap  - 1 x daily - 7 x weekly - 3 sets -  breaths hold - Quadruped Transversus Abdominis Bracing  - 1 x daily - 7 x weekly - 5 sets - 3-5 breaths hold - Beginner Front Arm Support  - 1 x daily - 7 x weekly - 10 sets - 3 breaths hold - Child's Pose Stretch  - 1 x daily - 7 x weekly - 3 sets - Supine 90/90 Isometric Hip Adduction  - 1 x daily - 7 x weekly - 3 sets - 10 reps - Tall Kneel Vertical Bridge  - 1 x daily - 7 x weekly - 3 sets - 10 reps - Tall Kneeling Eccentric Quadriceps Strengthening  - 1 x daily - 7 x weekly - 3 sets - 10 reps - Primal Push Up  - 1 x daily - 7 x weekly - 3 sets - 10 reps - Standing Hip Hinge with Dowel  - 1 x daily - 7 x weekly - 3 sets - 10 reps - Beginner Reverse Clamshell  - 1 x daily - 7 x weekly - 2 sets - 10 reps - Sidelying Thoracic  Rotation with Open Book  - 1 x daily - 7 x weekly - 2 sets - 10 reps - Supine SI Joint Self-Correction  - 1 x daily - 7 x weekly - 1 sets - 5 reps - 3-5s holds   ASSESSMENT:  CLINICAL IMPRESSION: Patient is a 64 y.o. F who was seen today for physical therapy for pelvic floor assessment and treatment. Details above. Pt demonstrated Rt obturator tightness and TTP pt does endorse similar pain felt at Rt glute that brought her in. Manual tolerated well and pt reported feeling less tight once completed. HEP updated for further improvement.   Will cont to benefit from skilled PT to address limitations and reach long term  functional goals.    REHAB POTENTIAL: Good  CLINICAL DECISION MAKING: Stable/uncomplicated  EVALUATION COMPLEXITY: Low   GOALS: Goals reviewed with patient? Yes  SHORT TERM GOALS: Target date: 12/19/23  Determine need for heel lift Baseline: Goal status: INITIAL  2.  Good mobility maintained in Lt SIJ between sessions Baseline:  Goal status: INITIAL   LONG TERM GOALS: Target date: POC date  Meet FOTO goal Baseline:  Goal status: INITIAL  2.  Ambulation without trendelenburg Baseline:  Goal status: INITIAL  3.  Drive to [place] without need for stop due to pain Baseline:  Goal status: INITIAL  4.  Forward flexion without burning in buttocks Baseline:  Goal status: INITIAL  5.  Independent in long term HEP Baseline:  Goal status: INITIAL    PLAN:  PT FREQUENCY: 1-2x/week  PT DURATION: POC date  PLANNED INTERVENTIONS: 97164- PT Re-evaluation, 97110-Therapeutic exercises, 97530- Therapeutic activity, 97112- Neuromuscular re-education, 97535- Self Care, 40981- Manual therapy, [phone]- Gait training, [phone]- Aquatic Therapy, Patient/Family education, Balance training, Stair training, Taping, Dry Needling, Joint mobilization, Spinal mobilization, Cryotherapy, and Moist heat.  PLAN FOR NEXT SESSION: progress manual PRN, lumbopelvic stability, hip  hinge   Otelia Sergeant, PT, DPT 01/22/254:28 PM

## 2023-12-31 ENCOUNTER — Ambulatory Visit (HOSPITAL_BASED_OUTPATIENT_CLINIC_OR_DEPARTMENT_OTHER): Payer: BC Managed Care – PPO | Admitting: Physical Therapy

## 2023-12-31 ENCOUNTER — Encounter (HOSPITAL_BASED_OUTPATIENT_CLINIC_OR_DEPARTMENT_OTHER): Payer: Self-pay | Admitting: Physical Therapy

## 2023-12-31 DIAGNOSIS — M5459 Other low back pain: Secondary | ICD-10-CM

## 2023-12-31 NOTE — Therapy (Signed)
OUTPATIENT PHYSICAL THERAPY PELVIC FLOOR ASSESSMENT   Patient Name: Priscilla Underwood MRN: [id number] DOB:[birth year], 63 y.o., female Today's Date: 12/31/2023  END OF SESSION:  PT End of Session - 12/31/23 2006     Visit Number 7    Number of Visits 25    Date for PT Re-Evaluation 02/20/23    Authorization Type BCBS no VL    PT Start Time 1605    PT Stop Time 1645    PT Time Calculation (min) 40 min    Activity Tolerance Patient tolerated treatment well    Behavior During Therapy WFL for tasks assessed/performed               Past Medical History:  Diagnosis Date   Arthritis    osteo arthritis   Depression    Fatty liver    Fibromyalgia    GERD (gastroesophageal reflux disease)    PONV (postoperative nausea and vomiting)    Past Surgical History:  Procedure Laterality Date   84 HOUR PH STUDY N/A 12/14/2019   Procedure: 24 HOUR PH STUDY;  Surgeon: Napoleon Form, MD;  Location: [facility];  Service: Endoscopy;  Laterality: N/A;   BUNIONECTOMY  2004   CARPAL TUNNEL RELEASE Left 06/15/2015   Procedure: LEFT CARPAL TUNNEL RELEASE;  Surgeon: Dominica Severin, MD;  Location: [facility];  Service: Orthopedics;  Laterality: Left;   CARPAL TUNNEL RELEASE Right 08/31/2015   Procedure: RIGHT LIMITED OPEN CARPAL TUNNEL RELEASE;  Surgeon: Dominica Severin, MD;  Location: [facility];  Service: Orthopedics;  Laterality: Right;   ESOPHAGEAL MANOMETRY N/A 12/14/2019   Procedure: ESOPHAGEAL MANOMETRY (EM);  Surgeon: Napoleon Form, MD;  Location: [facility];  Service: Endoscopy;  Laterality: N/A;  DR. Orvan Falconer   LIPOMA EXCISION Right 05/28/2018   Procedure: EXCISION LIPOMA OF SCALP;  Surgeon: Osborn Coho, MD;  Location: [facility];  Service: ENT;  Laterality: Right;   SHOULDER SURGERY  2010, 2008   TARSAL TUNNEL RELEASE Left 2012   TUBAL LIGATION  1985   Patient Active Problem List   Diagnosis Date Noted   Gastroesophageal  reflux disease    Globus sensation    Belching    Lipoma of scalp 05/28/2018    REFERRING PROVIDER:  Huel Cote, MD    REFERRING DIAG:  M54.42,M54.41,G89.29 (ICD-10-CM) - Chronic bilateral low back pain with bilateral sciatica    Pelvic floor, R hip/glute program, SI joint program   Rationale for Evaluation and Treatment: Rehabilitation  THERAPY DIAG:  Other low back pain  ONSET DATE: many years ago   SUBJECTIVE:  SUBJECTIVE STATEMENT: Babysat over the weekend and had the worse pain ever driving back from [place] on Monday on the right side of my back.  Denies change after internal obturator release.   BOWEL MOVEMENT: Pain with bowel movement: No Type of bowel movement:Type (Bristol Stool Scale) 4, Frequency daily, and Strain No Fully empty rectum: Yes:  Leakage: No Pads: No Fiber supplement: no  URINATION: Pain with urination: No Fully empty bladder: Yes:  Stream: Strong Urgency: Yes: sometimes after coffee Frequency: about an 1 hour Leakage: jumping, coughing Pads: No  INTERCOURSE: Pain with intercourse: not painful, but a little dry Ability to have vaginal penetration:  Yes:  Climax: harder to achieve  Marinoff Scale: 0/3  PREGNANCY: Vaginal deliveries 2 Tearing No C-section deliveries 0 Currently pregnant No  PROLAPSE: None    11/26/24: PERTINENT HISTORY:  fibromyalgia  PAIN:  Are you having pain? Yes: NPRS scale: no Pain location: Rt SIJ Pain description: tight, knot Aggravating factors: sitting in car Relieving factors: massage   PRECAUTIONS:  None  RED FLAGS: None   WEIGHT BEARING RESTRICTIONS:  No  FALLS:  Has patient fallen in last 6 months? No  OCCUPATION:  Retired form CA center at [employer]  PLOF:  Independent  PATIENT GOALS:  Bend  without pain, drive, be active and flexible, work outside   OBJECTIVE:  Note: Objective measures were completed at Evaluation unless otherwise noted. 12/30/23 PALPATION:   General  no TTP at abdominals, tightness at Rt oblique, thoracic paraspinals, and piriformis                External Perineal Exam no TTP, WFL, mild dryness                             Internal Pelvic Floor TTP and tightness at Rt obturator   Patient confirms identification and approves PT to assess internal pelvic floor and treatment Yes  PELVIC MMT:   MMT eval  Vaginal 4/5, 8s, 7 reps  Internal Anal Sphincter   External Anal Sphincter   Puborectalis   Diastasis Recti   (Blank rows = not tested)        TONE: WFL  PROLAPSE: Not seen in hooklying    11/26/24 DIAGNOSTIC FINDINGS:  Knee MRI 12/14 in imaging tab  PATIENT SURVEYS:  FOTO 53  COGNITIVE STATUS: Within functional limits for tasks assessed   SENSATION: WFL   GAIT: Comments: Rt trendelenburg   Body Part #1 Hip  PALPATION: Eval: limited Lt SIJ mobility   MMT (lb) Right 12/20 eval Left 12/20 eval  Hip flexion- seated 30.8 21.8  Hip extension    Hip abduction- sidelying 29 25.3  Hip adduction     (Blank rows = not tested)  TREATMENT: Treatment                            12/31/23:  Pt entered pool via stairs Utilized left handrails Depth up to   AquaticREHABdocumentation: Water will aid with movement using the current and laminar flow while the buoyancy reduces weight bearing and Hydrostatic pressure also supports joints by unweighting joint load by at least 50 % in 3-4 feet depth water. 80% in chest to neck deep water.  Marching witih arm swing Straight ar press down colored weights- +squat with forward reach Side steppng with isometric press down using colored weights Wall sit HS stretch with  noodle Wall sit with Lt hand press down, + Rt knee lift Hip hinge from wall sit- reach yellow noodle forward, add press down upon return  12/30/23 Manual: Patient consented to internal pelvic floor assessment and treatment vaginally this date and found to have TTP and tension at Rt obturator internus and trigger points tolerated manual of gentle stretching and release for improved mobility.    Exercises: Updated HEP for Rt obturator stretching and SIJ pain stretching. Pt completed x2 reps of all updated exercises.   Treatment                            eval 12/20:  Trigger Point Dry Needling  Subsequent Treatment: Instructions reviewed, if requested by the patient, prior to subsequent dry needling treatment.   Patient Verbal Consent Given: Yes Education Handout Provided: No has had DN Muscles Treated: Rt glut med/max Electrical Stimulation Performed: No Treatment Response/Outcome: decreased spasm  Lt SIJ PA spring mobs 1 layer heel lift in right shoe    PATIENT EDUCATION:  Education details: Anatomy of condition, POC, HEP, exercise form/rationale Person educated: Patient Education method: Explanation, Demonstration, Tactile cues, Verbal cues, and Handouts Education comprehension: verbalized understanding, returned demonstration, verbal cues required, tactile cues required, and needs further education  HOME EXERCISE PROGRAM: Wear lift, ball bw knees when seated Access Code: [email or web address] URL: [email or web address] Date: 12/30/2023 Prepared by: [facility] - [facility]patient Re[facility] - [facility]    ASSESSMENT:  CLINICAL IMPRESSION: Pt reports she will consider joining [facility] and has enjoyed aquatic exercise in the past. Denied notable change with exercise today so will move back to land appts for TPDN and manual therapy. Manual therapy with internal massage had limited affect but findings indicate she would benefit from a couple of more appointments. She  did babysit and drive a long distance over the weekend with increased pain which is not surprising with the amount of bending she did. As she did have benefit from injection prior to PT, I do believe it would be worth exploring as an option when the timeframe passes.    REHAB POTENTIAL: Good  CLINICAL DECISION MAKING: Stable/uncomplicated  EVALUATION COMPLEXITY: Low   GOALS: Goals reviewed with patient? Yes  SHORT TERM GOALS: Target date: 12/19/23  Determine need for heel lift Baseline: Goal status: achieved  2.  Good mobility maintained in Lt SIJ between sessions Baseline:  Goal status: achieved   LONG TERM GOALS: Target date: POC date  Meet FOTO goal Baseline:  Goal status: INITIAL  2.  Ambulation without trendelenburg Baseline:  Goal status: INITIAL  3.  Drive to [place] without need for stop due to pain Baseline:  Goal status: INITIAL  4.  Forward flexion without burning in buttocks Baseline:  Goal status: INITIAL  5.  Independent in long term HEP Baseline:  Goal status: INITIAL    PLAN:  PT FREQUENCY: 1-2x/week  PT DURATION: POC date  PLANNED INTERVENTIONS: 97164- PT Re-evaluation, 97110-Therapeutic exercises, 97530- Therapeutic activity, 97112- Neuromuscular re-education, 97535- Self Care, 16109- Manual therapy, [phone]- Gait training, [phone]- Aquatic Therapy, Patient/Family education, Balance training, Stair training, Taping, Dry Needling, Joint mobilization, Spinal mobilization, Cryotherapy, and Moist heat.  PLAN FOR NEXT SESSION: progress manual PRN, lumbopelvic stability, hip hinge   Wilho Sharpley C. Alverna Fawley PT, DPT 12/31/23 8:06 PM

## 2024-01-06 ENCOUNTER — Encounter (HOSPITAL_BASED_OUTPATIENT_CLINIC_OR_DEPARTMENT_OTHER): Payer: Self-pay | Admitting: Physical Therapy

## 2024-01-06 ENCOUNTER — Ambulatory Visit (HOSPITAL_BASED_OUTPATIENT_CLINIC_OR_DEPARTMENT_OTHER): Payer: BC Managed Care – PPO | Admitting: Physical Therapy

## 2024-01-06 DIAGNOSIS — M5459 Other low back pain: Secondary | ICD-10-CM | POA: Diagnosis not present

## 2024-01-06 NOTE — Therapy (Signed)
OUTPATIENT PHYSICAL THERAPY PELVIC FLOOR ASSESSMENT   Patient Name: Priscilla Underwood MRN: [id number] DOB:08-24-1961, 63 y.o., female Today's Date: 01/06/2024  END OF SESSION:  PT End of Session - 01/06/24 0933     Visit Number 8    Number of Visits 25    Date for PT Re-Evaluation 02/20/23    Authorization Type BCBS no VL    PT Start Time 0931    PT Stop Time 1015    PT Time Calculation (min) 44 min    Activity Tolerance Patient tolerated treatment well    Behavior During Therapy WFL for tasks assessed/performed                Past Medical History:  Diagnosis Date   Arthritis    osteo arthritis   Depression    Fatty liver    Fibromyalgia    GERD (gastroesophageal reflux disease)    PONV (postoperative nausea and vomiting)    Past Surgical History:  Procedure Laterality Date   39 HOUR PH STUDY N/A 12/14/2019   Procedure: 24 HOUR PH STUDY;  Surgeon: Napoleon Form, MD;  Location: [facility];  Service: Endoscopy;  Laterality: N/A;   BUNIONECTOMY  2004   CARPAL TUNNEL RELEASE Left 06/15/2015   Procedure: LEFT CARPAL TUNNEL RELEASE;  Surgeon: Dominica Severin, MD;  Location: [facility];  Service: Orthopedics;  Laterality: Left;   CARPAL TUNNEL RELEASE Right 08/31/2015   Procedure: RIGHT LIMITED OPEN CARPAL TUNNEL RELEASE;  Surgeon: Dominica Severin, MD;  Location: [facility];  Service: Orthopedics;  Laterality: Right;   ESOPHAGEAL MANOMETRY N/A 12/14/2019   Procedure: ESOPHAGEAL MANOMETRY (EM);  Surgeon: Napoleon Form, MD;  Location: [facility];  Service: Endoscopy;  Laterality: N/A;  DR. Orvan Falconer   LIPOMA EXCISION Right 05/28/2018   Procedure: EXCISION LIPOMA OF SCALP;  Surgeon: Osborn Coho, MD;  Location: [facility];  Service: ENT;  Laterality: Right;   SHOULDER SURGERY  2010, 2008   TARSAL TUNNEL RELEASE Left 2012   TUBAL LIGATION  1985   Patient Active Problem List   Diagnosis Date Noted   Gastroesophageal  reflux disease    Globus sensation    Belching    Lipoma of scalp 05/28/2018    REFERRING PROVIDER:  Huel Cote, MD    REFERRING DIAG:  M54.42,M54.41,G89.29 (ICD-10-CM) - Chronic bilateral low back pain with bilateral sciatica    Pelvic floor, R hip/glute program, SI joint program   Rationale for Evaluation and Treatment: Rehabilitation  THERAPY DIAG:  Other low back pain  ONSET DATE: many years ago   SUBJECTIVE:  SUBJECTIVE STATEMENT: Rt SIJ region and Lt QL very achey and tight. Feels right sit bone and groin.   BOWEL MOVEMENT: Pain with bowel movement: No Type of bowel movement:Type (Bristol Stool Scale) 4, Frequency daily, and Strain No Fully empty rectum: Yes:  Leakage: No Pads: No Fiber supplement: no  URINATION: Pain with urination: No Fully empty bladder: Yes:  Stream: Strong Urgency: Yes: sometimes after coffee Frequency: about an 1 hour Leakage: jumping, coughing Pads: No  INTERCOURSE: Pain with intercourse: not painful, but a little dry Ability to have vaginal penetration:  Yes:  Climax: harder to achieve  Marinoff Scale: 0/3  PREGNANCY: Vaginal deliveries 2 Tearing No C-section deliveries 0 Currently pregnant No  PROLAPSE: None    11/26/24: PERTINENT HISTORY:  fibromyalgia  PAIN:  Are you having pain? Yes: NPRS scale: no Pain location: Rt SIJ Pain description: tight, knot Aggravating factors: sitting in car Relieving factors: massage   PRECAUTIONS:  None  RED FLAGS: None   WEIGHT BEARING RESTRICTIONS:  No  FALLS:  Has patient fallen in last 6 months? No  OCCUPATION:  Retired form CA center at [employer]  PLOF:  Independent  PATIENT GOALS:  Bend without pain, drive, be active and flexible, work outside   OBJECTIVE:  Note: Objective  measures were completed at Evaluation unless otherwise noted. 12/30/23 PALPATION:   General  no TTP at abdominals, tightness at Rt oblique, thoracic paraspinals, and piriformis                External Perineal Exam no TTP, WFL, mild dryness                             Internal Pelvic Floor TTP and tightness at Rt obturator   Patient confirms identification and approves PT to assess internal pelvic floor and treatment Yes  PELVIC MMT:   MMT eval  Vaginal 4/5, 8s, 7 reps  Internal Anal Sphincter   External Anal Sphincter   Puborectalis   Diastasis Recti   (Blank rows = not tested)        TONE: WFL  PROLAPSE: Not seen in hooklying    11/26/24 DIAGNOSTIC FINDINGS:  Knee MRI 12/14 in imaging tab  PATIENT SURVEYS:  FOTO 53  COGNITIVE STATUS: Within functional limits for tasks assessed   SENSATION: WFL   GAIT: Comments: Rt trendelenburg   Body Part #1 Hip  PALPATION: Eval: limited Lt SIJ mobility   MMT (lb) Right 12/20 eval Left 12/20 eval  Hip flexion- seated 30.8 21.8  Hip extension    Hip abduction- sidelying 29 25.3  Hip adduction     (Blank rows = not tested)                                                                                                                               TREATMENT: Treatment  1/29:  LLD notable today- Rt anterior/Lt post with LLD (Rt longer) MET with dowel Lt flexors/Lt extensors, iso abd/add-2 rounds Glut activation in Rt stance phase- broken down to press hip forward to wall  Trigger Point Dry Needling  Subsequent Treatment: Instructions reviewed, if requested by the patient, prior to subsequent dry needling treatment.   Patient Verbal Consent Given: Yes Education Handout Provided: Previously Provided Muscles Treated: Rt hip glut max/med/min/TFL, piriformis Electrical Stimulation Performed: No Treatment Response/Outcome: twitch with decreased tension  Figure 4 stretch 3-layer heel  lift to left shoe   Treatment                            12/31/23:  Pt entered pool via stairs Utilized left handrails Depth up to   AquaticREHABdocumentation: Water will aid with movement using the current and laminar flow while the buoyancy reduces weight bearing and Hydrostatic pressure also supports joints by unweighting joint load by at least 50 % in 3-4 feet depth water. 80% in chest to neck deep water.  Marching witih arm swing Straight ar press down colored weights- +squat with forward reach Side steppng with isometric press down using colored weights Wall sit HS stretch with noodle Wall sit with Lt hand press down, + Rt knee lift Hip hinge from wall sit- reach yellow noodle forward, add press down upon return  12/30/23 Manual: Patient consented to internal pelvic floor assessment and treatment vaginally this date and found to have TTP and tension at Rt obturator internus and trigger points tolerated manual of gentle stretching and release for improved mobility.    Exercises: Updated HEP for Rt obturator stretching and SIJ pain stretching. Pt completed x2 reps of all updated exercises.   Treatment                            eval 12/20:  Trigger Point Dry Needling  Subsequent Treatment: Instructions reviewed, if requested by the patient, prior to subsequent dry needling treatment.   Patient Verbal Consent Given: Yes Education Handout Provided: No has had DN Muscles Treated: Rt glut med/max Electrical Stimulation Performed: No Treatment Response/Outcome: decreased spasm  Lt SIJ PA spring mobs 1 layer heel lift in right shoe    PATIENT EDUCATION:  Education details: Anatomy of condition, POC, HEP, exercise form/rationale Person educated: Patient Education method: Explanation, Demonstration, Tactile cues, Verbal cues, and Handouts Education comprehension: verbalized understanding, returned demonstration, verbal cues required, tactile cues required, and needs further  education  HOME EXERCISE PROGRAM: Wear lift, ball bw knees when seated Access Code: [email or web address] URL: [email or web address] Date: 12/30/2023 Prepared by: [facility] - [facility]patient Rehab - [facility]    ASSESSMENT:  CLINICAL IMPRESSION: Messaged Dr Shon Baton about repeat SIJ injection and pt will reach out to schedule appt. Added heel lift to shoe and worked on engaging glut in stance phase for support.    REHAB POTENTIAL: Good  CLINICAL DECISION MAKING: Stable/uncomplicated  EVALUATION COMPLEXITY: Low   GOALS: Goals reviewed with patient? Yes  SHORT TERM GOALS: Target date: 12/19/23  Determine need for heel lift Baseline: Goal status: achieved  2.  Good mobility maintained in Lt SIJ between sessions Baseline:  Goal status: achieved   LONG TERM GOALS: Target date: POC date  Meet FOTO goal Baseline:  Goal status: INITIAL  2.  Ambulation without trendelenburg Baseline:  Goal status: INITIAL  3.  Drive to [employer] without need for  stop due to pain Baseline:  Goal status: INITIAL  4.  Forward flexion without burning in buttocks Baseline:  Goal status: INITIAL  5.  Independent in long term HEP Baseline:  Goal status: INITIAL    PLAN:  PT FREQUENCY: 1-2x/week  PT DURATION: POC date  PLANNED INTERVENTIONS: 97164- PT Re-evaluation, 97110-Therapeutic exercises, 97530- Therapeutic activity, 97112- Neuromuscular re-education, 97535- Self Care, 56213- Manual therapy, [phone]- Gait training, [phone]- Aquatic Therapy, Patient/Family education, Balance training, Stair training, Taping, Dry Needling, Joint mobilization, Spinal mobilization, Cryotherapy, and Moist heat.  PLAN FOR NEXT SESSION: progress manual PRN, lumbopelvic stability, hip hinge   Cecilia Vancleve C. Haeleigh Streiff PT, DPT 01/06/24 2:16 PM

## 2024-01-11 ENCOUNTER — Encounter (HOSPITAL_BASED_OUTPATIENT_CLINIC_OR_DEPARTMENT_OTHER): Payer: Self-pay | Admitting: Physical Therapy

## 2024-01-11 ENCOUNTER — Ambulatory Visit (HOSPITAL_BASED_OUTPATIENT_CLINIC_OR_DEPARTMENT_OTHER): Payer: BC Managed Care – PPO | Attending: Orthopaedic Surgery | Admitting: Physical Therapy

## 2024-01-11 DIAGNOSIS — M5459 Other low back pain: Secondary | ICD-10-CM | POA: Diagnosis not present

## 2024-01-11 NOTE — Therapy (Signed)
OUTPATIENT PHYSICAL THERAPY PELVIC FLOOR ASSESSMENT   Patient Name: Priscilla Underwood MRN: [id number] DOB:1961/05/01, 63 y.o., female Today's Date: 01/11/2024  END OF SESSION:  PT End of Session - 01/11/24 1013     Visit Number 9    Number of Visits 25    Date for PT Re-Evaluation 02/20/23    Authorization Type BCBS no VL    PT Start Time 1013    PT Stop Time 1054    PT Time Calculation (min) 41 min    Activity Tolerance Patient tolerated treatment well    Behavior During Therapy WFL for tasks assessed/performed                Past Medical History:  Diagnosis Date   Arthritis    osteo arthritis   Depression    Fatty liver    Fibromyalgia    GERD (gastroesophageal reflux disease)    PONV (postoperative nausea and vomiting)    Past Surgical History:  Procedure Laterality Date   1 HOUR PH STUDY N/A 12/14/2019   Procedure: 24 HOUR PH STUDY;  Surgeon: Napoleon Form, MD;  Location: [facility];  Service: Endoscopy;  Laterality: N/A;   BUNIONECTOMY  2004   CARPAL TUNNEL RELEASE Left 06/15/2015   Procedure: LEFT CARPAL TUNNEL RELEASE;  Surgeon: Dominica Severin, MD;  Location: [facility];  Service: Orthopedics;  Laterality: Left;   CARPAL TUNNEL RELEASE Right 08/31/2015   Procedure: RIGHT LIMITED OPEN CARPAL TUNNEL RELEASE;  Surgeon: Dominica Severin, MD;  Location: [facility];  Service: Orthopedics;  Laterality: Right;   ESOPHAGEAL MANOMETRY N/A 12/14/2019   Procedure: ESOPHAGEAL MANOMETRY (EM);  Surgeon: Napoleon Form, MD;  Location: [facility];  Service: Endoscopy;  Laterality: N/A;  DR. Orvan Falconer   LIPOMA EXCISION Right 05/28/2018   Procedure: EXCISION LIPOMA OF SCALP;  Surgeon: Osborn Coho, MD;  Location:  [facility];  Service: ENT;  Laterality: Right;   SHOULDER SURGERY  2010, 2008   TARSAL TUNNEL RELEASE Left 2012   TUBAL LIGATION  1985   Patient Active Problem List   Diagnosis Date Noted   Gastroesophageal  reflux disease    Globus sensation    Belching    Lipoma of scalp 05/28/2018    REFERRING PROVIDER:  Huel Cote, MD    REFERRING DIAG:  M54.42,M54.41,G89.29 (ICD-10-CM) - Chronic bilateral low back pain with bilateral sciatica    Pelvic floor, R hip/glute program, SI joint program   Rationale for Evaluation and Treatment: Rehabilitation  THERAPY DIAG:  Other low back pain  ONSET DATE: many years ago   SUBJECTIVE:  SUBJECTIVE STATEMENT: Rt SIJ region and Lt QL very achey and tight. Feels right sit bone and groin.   BOWEL MOVEMENT: Pain with bowel movement: No Type of bowel movement:Type (Bristol Stool Scale) 4, Frequency daily, and Strain No Fully empty rectum: Yes:  Leakage: No Pads: No Fiber supplement: no  URINATION: Pain with urination: No Fully empty bladder: Yes:  Stream: Strong Urgency: Yes: sometimes after coffee Frequency: about an 1 hour Leakage: jumping, coughing Pads: No  INTERCOURSE: Pain with intercourse: not painful, but a little dry Ability to have vaginal penetration:  Yes:  Climax: harder to achieve  Marinoff Scale: 0/3  PREGNANCY: Vaginal deliveries 2 Tearing No C-section deliveries 0 Currently pregnant No  PROLAPSE: None    11/26/24: PERTINENT HISTORY:  fibromyalgia  PAIN:  Are you having pain? Yes: NPRS scale: no Pain location: Rt SIJ Pain description: tight, knot Aggravating factors: sitting in car Relieving factors: massage   PRECAUTIONS:  None  RED FLAGS: None   WEIGHT BEARING RESTRICTIONS:  No  FALLS:  Has patient fallen in last 6 months? No  OCCUPATION:  Retired form [employer] center at [employer]  PLOF:  Independent  PATIENT GOALS:  Bend without pain, drive, be active and flexible, work outside   OBJECTIVE:  Note: Objective  measures were completed at Evaluation unless otherwise noted. 12/30/23 PALPATION:   General  no TTP at abdominals, tightness at Rt oblique, thoracic paraspinals, and piriformis                External Perineal Exam no TTP, WFL, mild dryness                             Internal Pelvic Floor TTP and tightness at Rt obturator   Patient confirms identification and approves PT to assess internal pelvic floor and treatment Yes  PELVIC MMT:   MMT eval  Vaginal 4/5, 8s, 7 reps  Internal Anal Sphincter   External Anal Sphincter   Puborectalis   Diastasis Recti   (Blank rows = not tested)        TONE: WFL  PROLAPSE: Not seen in hooklying    11/26/24 DIAGNOSTIC FINDINGS:  Knee MRI 12/14 in imaging tab  PATIENT SURVEYS:  FOTO 53  COGNITIVE STATUS: Within functional limits for tasks assessed   SENSATION: WFL   GAIT: Comments: Rt trendelenburg   Body Part #1 Hip  PALPATION: Eval: limited Lt SIJ mobility   MMT (lb) Right 12/20 eval Left 12/20 eval  Hip flexion- seated 30.8 21.8  Hip extension    Hip abduction- sidelying 29 25.3  Hip adduction     (Blank rows = not tested)                                                                                                                               TREATMENT: Treatment  2/3:  Gait- glut activation at heel strike, arm swing/trunk rotation Piriformis stretch LTR Bridge with adduction squeezes on ball Ball bw knees table plank<>squat, glut set/core engagement Table plank- UE step out/in Walking with 1lb weights for oblique activation in walking Child pose modified to standing knee flexion/ext in L stretch Hip hinge in standing with dowel Trigger Point Dry Needling  Subsequent Treatment: Instructions provided previously at initial dry needling treatment.   Patient Verbal Consent Given: Yes Education Handout Provided: Previously Provided Muscles Treated: Rt glut max, TFL,  piriformis Electrical Stimulation Performed: No Treatment Response/Outcome: twitch with decreased concordant pain, resultant soreness as expected     Treatment                            1/29:  LLD notable today- Rt anterior/Lt post with LLD (Rt longer) MET with dowel Lt flexors/Lt extensors, iso abd/add-2 rounds Glut activation in Rt stance phase- broken down to press hip forward to wall  Trigger Point Dry Needling  Subsequent Treatment: Instructions reviewed, if requested by the patient, prior to subsequent dry needling treatment.   Patient Verbal Consent Given: Yes Education Handout Provided: Previously Provided Muscles Treated: Rt hip glut max/med/min/TFL, piriformis Electrical Stimulation Performed: No Treatment Response/Outcome: twitch with decreased tension  Figure 4 stretch 3-layer heel lift to left shoe   Treatment                            12/31/23:  Pt entered pool via stairs Utilized left handrails Depth up to   AquaticREHABdocumentation: Water will aid with movement using the current and laminar flow while the buoyancy reduces weight bearing and Hydrostatic pressure also supports joints by unweighting joint load by at least 50 % in 3-4 feet depth water. 80% in chest to neck deep water.  Marching witih arm swing Straight ar press down colored weights- +squat with forward reach Side steppng with isometric press down using colored weights Wall sit HS stretch with noodle Wall sit with Lt hand press down, + Rt knee lift Hip hinge from wall sit- reach yellow noodle forward, add press down upon return  12/30/23 Manual: Patient consented to internal pelvic floor assessment and treatment vaginally this date and found to have TTP and tension at Rt obturator internus and trigger points tolerated manual of gentle stretching and release for improved mobility.    Exercises: Updated HEP for Rt obturator stretching and SIJ pain stretching. Pt completed x2 reps of all updated  exercises.   Treatment                            eval 12/20:  Trigger Point Dry Needling  Subsequent Treatment: Instructions reviewed, if requested by the patient, prior to subsequent dry needling treatment.   Patient Verbal Consent Given: Yes Education Handout Provided: No has had DN Muscles Treated: Rt glut med/max Electrical Stimulation Performed: No Treatment Response/Outcome: decreased spasm  Lt SIJ PA spring mobs 1 layer heel lift in right shoe    PATIENT EDUCATION:  Education details: Anatomy of condition, POC, HEP, exercise form/rationale Person educated: Patient Education method: Explanation, Demonstration, Tactile cues, Verbal cues, and Handouts Education comprehension: verbalized understanding, returned demonstration, verbal cues required, tactile cues required, and needs further education  HOME EXERCISE PROGRAM: Wear lift, ball bw knees when seated Access Code: [email or web address] URL: [email or web address] Date: 12/30/2023 Prepared by: [facility] -  Outpatient Rehab - [facility]    ASSESSMENT:  CLINICAL IMPRESSION: Excellent progression with NM re-ed in coordinated activation of core/glutes. Injection scheuled tomorrow. Would like to progress to SL functional activities after.     REHAB POTENTIAL: Good  CLINICAL DECISION MAKING: Stable/uncomplicated  EVALUATION COMPLEXITY: Low   GOALS: Goals reviewed with patient? Yes  SHORT TERM GOALS: Target date: 12/19/23  Determine need for heel lift Baseline: Goal status: achieved  2.  Good mobility maintained in Lt SIJ between sessions Baseline:  Goal status: achieved   LONG TERM GOALS: Target date: POC date  Meet FOTO goal Baseline:  Goal status: INITIAL  2.  Ambulation without trendelenburg Baseline:  Goal status: INITIAL  3.  Drive to CLT without need for stop due to pain Baseline:  Goal status: INITIAL  4.  Forward flexion without burning in buttocks Baseline:  Goal  status: INITIAL  5.  Independent in long term HEP Baseline:  Goal status: INITIAL    PLAN:  PT FREQUENCY: 1-2x/week  PT DURATION: POC date  PLANNED INTERVENTIONS: 97164- PT Re-evaluation, 97110-Therapeutic exercises, 97530- Therapeutic activity, 97112- Neuromuscular re-education, 97535- Self Care, 02725- Manual therapy, [phone]- Gait training, [phone]- Aquatic Therapy, Patient/Family education, Balance training, Stair training, Taping, Dry Needling, Joint mobilization, Spinal mobilization, Cryotherapy, and Moist heat.  PLAN FOR NEXT SESSION: progress manual PRN, lumbopelvic stability, hip hinge   Graylin Sperling C. Torrin Crihfield PT, DPT 01/11/24 10:55 AM

## 2024-01-12 ENCOUNTER — Other Ambulatory Visit: Payer: Self-pay

## 2024-01-12 ENCOUNTER — Encounter: Payer: Self-pay | Admitting: Sports Medicine

## 2024-01-12 ENCOUNTER — Ambulatory Visit: Payer: BC Managed Care – PPO | Admitting: Sports Medicine

## 2024-01-12 DIAGNOSIS — M533 Sacrococcygeal disorders, not elsewhere classified: Secondary | ICD-10-CM | POA: Diagnosis not present

## 2024-01-12 DIAGNOSIS — M2170 Unequal limb length (acquired), unspecified site: Secondary | ICD-10-CM

## 2024-01-12 DIAGNOSIS — M7918 Myalgia, other site: Secondary | ICD-10-CM | POA: Diagnosis not present

## 2024-01-12 DIAGNOSIS — G8929 Other chronic pain: Secondary | ICD-10-CM | POA: Diagnosis not present

## 2024-01-12 NOTE — Progress Notes (Signed)
Patient says she got about 2 weeks of good relief from the last injection. She says that she noticed it most when driving as that had caused so much discomfort.

## 2024-01-12 NOTE — Progress Notes (Signed)
 Priscilla Underwood - 63 y.o. female MRN [id number]  Date of birth: October 17, 1961  Office Visit Note: Visit Date: 01/12/2024 PCP: Cindy Clotilda HERO, DO Referred by: Cindy Clotilda HERO, DO  Subjective: Chief Complaint  Patient presents with   Lower Back - Pain    Right SI joint Injection   HPI: Priscilla Underwood is a pleasant 63 y.o. female who presents today for follow-up of right SI joint pain.  Priscilla Underwood has made good progress with formalized physical therapy with [facility].  They did place a heel lift into her shoe which helped balance her hips and this has further improved.  We did perform an SI joint injection under ultrasound guidance back on 09/25/2023 at which gave her quite good relief for a few weeks but then her pain returned, but she had not made as much progress with PT and with the therapy at that point.  Uses diclofenac  once daily for other joint pain/fibromyalgia.  Pertinent ROS were reviewed with the patient and found to be negative unless otherwise specified above in HPI.   Assessment & Plan: Visit Diagnoses:  1. Chronic right SI joint pain   2. Right buttock pain   3. Leg length discrepancy    Plan: Discussed with Priscilla Underwood today using a model both the SI joint as well as the complex overlying connective tissue and musculature that surrounds and supports the posterior hip and SI joint.  Discussed how this is multifactorial because of her pain.  Through shared decision making, we did proceed with an ultrasound-guided right SI joint injection, she tolerated well.  Advised on 48 hours of modified rest/activity.  She may use ice/heat and/or her diclofenac /Tylenol  for any pain control.  She may then return to formalized physical therapy and continuing her heel lift which has been beneficial for her.  I am hoping this will get her over the top and she will continue to further improved.  I did discuss if in 6-8 weeks she is having plateau of her improvement she will follow-up with  Priscilla Underwood or Priscilla Underwood, I am happy to see her myself as well.  Follow-up: Return for f/u with Priscilla Underwood or myself in 6-8 weeks.   Meds & Orders: No orders of the defined types were placed in this encounter.   Orders Placed This Encounter  Procedures   US  Guided Needle Placement - No Linked Charges     Procedures: U/S-guided SI-joint injection, Right   After discussion of risk/benefits/indications, informed verbal consent was obtained. A timeout was then performed. The patient was positioned in a prone position on exam room table with a pillow placed under the pelvis for mild hip flexion. The SI joint area was cleaned and prepped with betadine and alcohol swabs. Sterile ultrasound gel was applied and the ultrasound transducer was placed in an anatomic axial plane over the PSIS, then moved distally over the SI-joint. Using ultrasound guidance, a 22-gauge, 3.5 needle was inserted from a medial to lateral approach utilizing an in-plane approach and directed into the SI-joint. The SI-joint was then injected with a mixture of 4:2 lidocaine :depomedrol with visualization of the injectate flow into the SI-joint under ultrasound visualization. The patient tolerated the procedure well without immediate complications.       Clinical History: No specialty comments available.  She reports that she has quit smoking. Her smoking use included cigarettes. She has never used smokeless tobacco. No results for input(s): HGBA1C, LABURIC in the last 8760 hours.  Objective:    Physical  Exam  Gen: Well-appearing, in no acute distress; non-toxic CV: Well-perfused. Warm.  Resp: Breathing unlabored on room air; no wheezing. Psych: Fluid speech in conversation; appropriate affect; normal thought process  Ortho Exam - Right SI joint/buttock: + TTP over the posterior aspect of the right SI joint just inferior to the PSIS.  Positive SI joint compression test.   Imaging: -Reviewed MRI lumbar spine today  from 08/27/2023. Narrative & Impression  CLINICAL DATA:  Five year history of back pain   EXAM: MRI LUMBAR SPINE WITHOUT CONTRAST   TECHNIQUE: Multiplanar, multisequence MR imaging of the lumbar spine was performed. No intravenous contrast was administered.   COMPARISON:  None Available.   FINDINGS: Segmentation:  Standard.   Alignment: Trace retrolisthesis of L1 on L2. Grade 1 anterolisthesis of L3 on L4.   Vertebrae: No fracture, evidence of discitis, or bone lesion. Small Schmorl's node at L5.   Conus medullaris and cauda equina: Conus extends to the L2 level. Conus and cauda equina appear normal.   Paraspinal and other soft tissues: Negative.   Disc levels:   T12-L1: Mild bilateral facet degenerative change. Minimal disc bulge. No spinal canal narrowing. No neural foraminal narrowing.   L1-L2: Eccentric left disc bulge. Mild bilateral facet degenerative change. Mild spinal canal narrowing. Moderate left and mild right neural foraminal narrowing.   L2-L3: Mild bilateral facet degenerative change. No significant disc bulge. No spinal canal narrowing. No neural foraminal narrowing.   L3-L4: Moderate bilateral facet degenerative change. Circumferential disc bulge. Moderate spinal canal narrowing. Mild left and moderate right neural foraminal narrowing.   L4-L5: Mild bilateral facet degenerative change. Circumferential disc bulge. Mild spinal canal narrowing. Mild bilateral neural foraminal narrowing.   L5-S1: Central disc protrusion. Moderate left and mild right facet degenerative change. Mild overall spinal canal narrowing. Mild bilateral neural foraminal narrowing.   IMPRESSION: Multilevel degenerative changes of the lumbar spine, most pronounced at L3-L4 where there is moderate spinal canal narrowing and moderate right neural foraminal narrowing.     Electronically Signed   By: Lyndall Gore M.D.   On: 09/08/2023 14:39    Past  Medical/Family/Surgical/Social History: Medications & Allergies reviewed per EMR, new medications updated. Patient Active Problem List   Diagnosis Date Noted   Gastroesophageal reflux disease    Globus sensation    Belching    Lipoma of scalp 05/28/2018   Past Medical History:  Diagnosis Date   Arthritis    osteo arthritis   Depression    Fatty liver    Fibromyalgia    GERD (gastroesophageal reflux disease)    PONV (postoperative nausea and vomiting)    Family History  Problem Relation Age of Onset   Breast cancer Sister 43   Breast cancer Paternal Aunt 35   Lung cancer Mother    Heart disease Father    Heart failure Maternal Grandmother    Lung cancer Maternal Grandfather    Pancreatic cancer Paternal Grandmother    Colon cancer Neg Hx    Esophageal cancer Neg Hx    Rectal cancer Neg Hx    Stomach cancer Neg Hx    Past Surgical History:  Procedure Laterality Date   76 HOUR PH STUDY N/A 12/14/2019   Procedure: 24 HOUR PH STUDY;  Surgeon: Shila Gustav GAILS, MD;  Location: [facility];  Service: Endoscopy;  Laterality: N/A;   BUNIONECTOMY  2004   CARPAL TUNNEL RELEASE Left 06/15/2015   Procedure: LEFT CARPAL TUNNEL RELEASE;  Surgeon: Elsie Mussel, MD;  Location:  [facility];  Service: Orthopedics;  Laterality: Left;   CARPAL TUNNEL RELEASE Right 08/31/2015   Procedure: RIGHT LIMITED OPEN CARPAL TUNNEL RELEASE;  Surgeon: Elsie Mussel, MD;  Location: [facility];  Service: Orthopedics;  Laterality: Right;   ESOPHAGEAL MANOMETRY N/A 12/14/2019   Procedure: ESOPHAGEAL MANOMETRY (EM);  Surgeon: Shila Gustav GAILS, MD;  Location: [facility];  Service: Endoscopy;  Laterality: N/A;  DR. EDA   LIPOMA EXCISION Right 05/28/2018   Procedure: EXCISION LIPOMA OF SCALP;  Surgeon: Mable Lenis, MD;  Location: [facility];  Service: ENT;  Laterality: Right;   SHOULDER SURGERY  2010, 2008   TARSAL TUNNEL RELEASE Left 2012   TUBAL  LIGATION  1985   Social History   Occupational History   Not on file  Tobacco Use   Smoking status: Former    Types: Cigarettes   Smokeless tobacco: Never   Tobacco comments:    Quit 35 years ago  Vaping Use   Vaping status: Never Used  Substance and Sexual Activity   Alcohol use: No   Drug use: No   Sexual activity: Yes    Birth control/protection: Post-menopausal

## 2024-01-14 ENCOUNTER — Ambulatory Visit (HOSPITAL_BASED_OUTPATIENT_CLINIC_OR_DEPARTMENT_OTHER): Payer: BC Managed Care – PPO | Admitting: Physical Therapy

## 2024-01-14 ENCOUNTER — Encounter (HOSPITAL_BASED_OUTPATIENT_CLINIC_OR_DEPARTMENT_OTHER): Payer: Self-pay | Admitting: Physical Therapy

## 2024-01-14 DIAGNOSIS — M5459 Other low back pain: Secondary | ICD-10-CM | POA: Diagnosis not present

## 2024-01-14 NOTE — Therapy (Signed)
 OUTPATIENT PHYSICAL THERAPY PELVIC FLOOR ASSESSMENT   Patient Name: CLARISSIA MCKEEN MRN: [id number] DOB:1961-02-25, 63 y.o., female Today's Date: 01/14/2024  END OF SESSION:  PT End of Session - 01/14/24 1514     Visit Number 10    Number of Visits 25    Date for PT Re-Evaluation 02/20/23    Authorization Type BCBS no VL    PT Start Time 1515    PT Stop Time 1557    PT Time Calculation (min) 42 min    Activity Tolerance Patient tolerated treatment well    Behavior During Therapy WFL for tasks assessed/performed                Past Medical History:  Diagnosis Date   Arthritis    osteo arthritis   Depression    Fatty liver    Fibromyalgia    GERD (gastroesophageal reflux disease)    PONV (postoperative nausea and vomiting)    Past Surgical History:  Procedure Laterality Date   47 HOUR PH STUDY N/A 12/14/2019   Procedure: 24 HOUR PH STUDY;  Surgeon: Shila Gustav GAILS, MD;  Location: [facility];  Service: Endoscopy;  Laterality: N/A;   BUNIONECTOMY  2004   CARPAL TUNNEL RELEASE Left 06/15/2015   Procedure: LEFT CARPAL TUNNEL RELEASE;  Surgeon: Elsie Mussel, MD;  Location: [facility];  Service: Orthopedics;  Laterality: Left;   CARPAL TUNNEL RELEASE Right 08/31/2015   Procedure: RIGHT LIMITED OPEN CARPAL TUNNEL RELEASE;  Surgeon: Elsie Mussel, MD;  Location: [facility];  Service: Orthopedics;  Laterality: Right;   ESOPHAGEAL MANOMETRY N/A 12/14/2019   Procedure: ESOPHAGEAL MANOMETRY (EM);  Surgeon: Shila Gustav GAILS, MD;  Location: [facility];  Service: Endoscopy;  Laterality: N/A;  DR. EDA   LIPOMA EXCISION Right 05/28/2018   Procedure: EXCISION LIPOMA OF SCALP;  Surgeon: Mable Lenis, MD;  Location: [facility];  Service: ENT;  Laterality: Right;   SHOULDER SURGERY  2010, 2008   TARSAL TUNNEL RELEASE Left 2012   TUBAL LIGATION  1985   Patient Active Problem List   Diagnosis Date Noted   Gastroesophageal  reflux disease    Globus sensation    Belching    Lipoma of scalp 05/28/2018    REFERRING PROVIDER:  Genelle Standing, MD    REFERRING DIAG:  M54.42,M54.41,G89.29 (ICD-10-CM) - Chronic bilateral low back pain with bilateral sciatica    Pelvic floor, R hip/glute program, SI joint program   Rationale for Evaluation and Treatment: Rehabilitation  THERAPY DIAG:  Other low back pain  ONSET DATE: many years ago   SUBJECTIVE:  SUBJECTIVE STATEMENT: Injection went well, not really noticing anything yet. That spot seems a little less tight. Driving to [place] a week from Sunday.   BOWEL MOVEMENT: Pain with bowel movement: No Type of bowel movement:Type (Bristol Stool Scale) 4, Frequency daily, and Strain No Fully empty rectum: Yes:  Leakage: No Pads: No Fiber supplement: no  URINATION: Pain with urination: No Fully empty bladder: Yes:  Stream: Strong Urgency: Yes: sometimes after coffee Frequency: about an 1 hour Leakage: jumping, coughing Pads: No  INTERCOURSE: Pain with intercourse: not painful, but a little dry Ability to have vaginal penetration:  Yes:  Climax: harder to achieve  Marinoff Scale: 0/3  PREGNANCY: Vaginal deliveries 2 Tearing No C-section deliveries 0 Currently pregnant No  PROLAPSE: None    11/26/24: PERTINENT HISTORY:  fibromyalgia  PAIN:  Are you having pain? Yes: NPRS scale: 4 Pain location: lateral to Rt SIJ Pain description: tight, knot Aggravating factors: sitting in car Relieving factors: massage   PRECAUTIONS:  None  RED FLAGS: None   WEIGHT BEARING RESTRICTIONS:  No  FALLS:  Has patient fallen in last 6 months? No  OCCUPATION:  Retired form CA center at [employer]  PLOF:  Independent  PATIENT GOALS:  Bend without pain, drive, be active  and flexible, work outside   OBJECTIVE:  Note: Objective measures were completed at Evaluation unless otherwise noted. 12/30/23 PALPATION:   General  no TTP at abdominals, tightness at Rt oblique, thoracic paraspinals, and piriformis                External Perineal Exam no TTP, WFL, mild dryness                             Internal Pelvic Floor TTP and tightness at Rt obturator   Patient confirms identification and approves PT to assess internal pelvic floor and treatment Yes  PELVIC MMT:   MMT eval  Vaginal 4/5, 8s, 7 reps  Internal Anal Sphincter   External Anal Sphincter   Puborectalis   Diastasis Recti   (Blank rows = not tested)        TONE: WFL  PROLAPSE: Not seen in hooklying    11/26/24 DIAGNOSTIC FINDINGS:  Knee MRI 12/14 in imaging tab  PATIENT SURVEYS:  FOTO 53  COGNITIVE STATUS: Within functional limits for tasks assessed   SENSATION: WFL   GAIT: Comments: Rt trendelenburg   Body Part #1 Hip  PALPATION: Eval: limited Lt SIJ mobility   MMT (lb) Right 12/20 eval Left 12/20 eval  Hip flexion- seated 30.8 21.8  Hip extension    Hip abduction- sidelying 29 25.3  Hip adduction     (Blank rows = not tested)                                                                                                                               TREATMENT: Treatment  2/6: Blank lines following charge title = not provided on this treatment date.   Manual:  TPDN YES Trigger Point Dry Needling  Subsequent Treatment: Instructions provided previously at initial dry needling treatment.   Patient Verbal Consent Given: Yes Education Handout Provided: Previously Provided Muscles Treated: Rt glut max, piriformis Electrical Stimulation Performed: No Treatment Response/Outcome: twitch with decreased spasm Cross friction massage following DN There-ex: Standing mod pigeon pose right leg Plank squat- glut activation to return to  upright Sidelying hip burner series- abd, circles, arcs There-Act: Hip hinge progressed to lifting 15lb kettle bell to simulate lifting child Self Care:  Nuro-Re-ed:  Gait Training:    Treatment                            2/3:  Gait- glut activation at heel strike, arm swing/trunk rotation Piriformis stretch LTR Bridge with adduction squeezes on ball Ball bw knees table plank<>squat, glut set/core engagement Table plank- UE step out/in Walking with 1lb weights for oblique activation in walking Child pose modified to standing knee flexion/ext in L stretch Hip hinge in standing with dowel Trigger Point Dry Needling  Subsequent Treatment: Instructions provided previously at initial dry needling treatment.   Patient Verbal Consent Given: Yes Education Handout Provided: Previously Provided Muscles Treated: Rt glut max, TFL, piriformis Electrical Stimulation Performed: No Treatment Response/Outcome: twitch with decreased concordant pain, resultant soreness as expected       PATIENT EDUCATION:  Education details: Anatomy of condition, POC, HEP, exercise form/rationale Person educated: Patient Education method: Explanation, Demonstration, Tactile cues, Verbal cues, and Handouts Education comprehension: verbalized understanding, returned demonstration, verbal cues required, tactile cues required, and needs further education  HOME EXERCISE PROGRAM: Wear lift, ball bw knees when seated Access Code: [email or web address] URL: [email or web address] Date: 12/30/2023 Prepared by: [facility] - Outpatient Rehab - [facility]    ASSESSMENT:  CLINICAL IMPRESSION: Demonstrated excellent gait form and bilateral glut activation. Progressed hip stretch to pigeon.    REHAB POTENTIAL: Good  CLINICAL DECISION MAKING: Stable/uncomplicated  EVALUATION COMPLEXITY: Low   GOALS: Goals reviewed with patient? Yes  SHORT TERM GOALS: Target date: 12/19/23  Determine  need for heel lift Baseline: Goal status: achieved  2.  Good mobility maintained in Lt SIJ between sessions Baseline:  Goal status: achieved   LONG TERM GOALS: Target date: POC date  Meet FOTO goal Baseline:  Goal status: INITIAL  2.  Ambulation without trendelenburg Baseline:  Goal status: INITIAL  3.  Drive to [place] without need for stop due to pain Baseline:  Goal status: INITIAL  4.  Forward flexion without burning in buttocks Baseline:  Goal status: INITIAL  5.  Independent in long term HEP Baseline:  Goal status: INITIAL    PLAN:  PT FREQUENCY: 1-2x/week  PT DURATION: POC date  PLANNED INTERVENTIONS: 97164- PT Re-evaluation, 97110-Therapeutic exercises, 97530- Therapeutic activity, 97112- Neuromuscular re-education, 97535- Self Care, 02859- Manual therapy, [phone]- Gait training, [phone]- Aquatic Therapy, Patient/Family education, Balance training, Stair training, Taping, Dry Needling, Joint mobilization, Spinal mobilization, Cryotherapy, and Moist heat.  PLAN FOR NEXT SESSION: progress manual PRN, lumbopelvic stability, hip hinge   Modesty Rudy C. Nolene Rocks PT, DPT 01/14/24 3:58 PM

## 2024-01-22 ENCOUNTER — Encounter (HOSPITAL_BASED_OUTPATIENT_CLINIC_OR_DEPARTMENT_OTHER): Payer: Self-pay

## 2024-01-25 ENCOUNTER — Encounter (HOSPITAL_BASED_OUTPATIENT_CLINIC_OR_DEPARTMENT_OTHER): Payer: BC Managed Care – PPO | Admitting: Physical Therapy

## 2024-01-28 ENCOUNTER — Encounter (HOSPITAL_BASED_OUTPATIENT_CLINIC_OR_DEPARTMENT_OTHER): Payer: BC Managed Care – PPO | Admitting: Physical Therapy

## 2024-01-30 ENCOUNTER — Encounter (HOSPITAL_BASED_OUTPATIENT_CLINIC_OR_DEPARTMENT_OTHER): Payer: BC Managed Care – PPO | Admitting: Physical Therapy

## 2024-02-01 ENCOUNTER — Encounter (HOSPITAL_BASED_OUTPATIENT_CLINIC_OR_DEPARTMENT_OTHER): Payer: Self-pay | Admitting: Physical Therapy

## 2024-02-02 ENCOUNTER — Ambulatory Visit (HOSPITAL_BASED_OUTPATIENT_CLINIC_OR_DEPARTMENT_OTHER): Payer: BC Managed Care – PPO | Admitting: Physical Therapy

## 2024-02-02 ENCOUNTER — Encounter (HOSPITAL_BASED_OUTPATIENT_CLINIC_OR_DEPARTMENT_OTHER): Payer: Self-pay | Admitting: Physical Therapy

## 2024-02-02 DIAGNOSIS — M5459 Other low back pain: Secondary | ICD-10-CM | POA: Diagnosis not present

## 2024-02-02 NOTE — Therapy (Signed)
 OUTPATIENT PHYSICAL THERAPY Discharge note   Patient Name: Priscilla Underwood MRN: [id number] DOB:[birth year], 63 y.o., female Today's Date: 02/02/2024  END OF SESSION:  PT End of Session - 02/02/24 1012     Visit Number 11    Number of Visits 25    Date for PT Re-Evaluation 02/20/23    Authorization Type BCBS no VL    PT Start Time 1012    PT Stop Time 1052    PT Time Calculation (min) 40 min    Activity Tolerance Patient tolerated treatment well    Behavior During Therapy WFL for tasks assessed/performed                 Past Medical History:  Diagnosis Date   Arthritis    osteo arthritis   Depression    Fatty liver    Fibromyalgia    GERD (gastroesophageal reflux disease)    PONV (postoperative nausea and vomiting)    Past Surgical History:  Procedure Laterality Date   14 HOUR PH STUDY N/A 12/14/2019   Procedure: 24 HOUR PH STUDY;  Surgeon: Napoleon Form, MD;  Location: [facility];  Service: Endoscopy;  Laterality: N/A;   BUNIONECTOMY  2004   CARPAL TUNNEL RELEASE Left 06/15/2015   Procedure: LEFT CARPAL TUNNEL RELEASE;  Surgeon: Dominica Severin, MD;  Location: [facility];  Service: Orthopedics;  Laterality: Left;   CARPAL TUNNEL RELEASE Right 08/31/2015   Procedure: RIGHT LIMITED OPEN CARPAL TUNNEL RELEASE;  Surgeon: Dominica Severin, MD;  Location: [facility];  Service: Orthopedics;  Laterality: Right;   ESOPHAGEAL MANOMETRY N/A 12/14/2019   Procedure: ESOPHAGEAL MANOMETRY (EM);  Surgeon: Napoleon Form, MD;  Location: [facility];  Service: Endoscopy;  Laterality: N/A;  DR. Orvan Falconer   LIPOMA EXCISION Right 05/28/2018   Procedure: EXCISION LIPOMA OF SCALP;  Surgeon: Osborn Coho, MD;  Location: [facility];  Service: ENT;  Laterality: Right;   SHOULDER SURGERY  2010, 2008   TARSAL TUNNEL RELEASE Left 2012   TUBAL LIGATION  1985   Patient Active Problem List   Diagnosis Date Noted   Gastroesophageal reflux  disease    Globus sensation    Belching    Lipoma of scalp 05/28/2018    REFERRING PROVIDER:  Huel Cote, MD    REFERRING DIAG:  M54.42,M54.41,G89.29 (ICD-10-CM) - Chronic bilateral low back pain with bilateral sciatica    Pelvic floor, R hip/glute program, SI joint program   Rationale for Evaluation and Treatment: Rehabilitation  THERAPY DIAG:  Other low back pain  ONSET DATE: many years ago   SUBJECTIVE:  SUBJECTIVE STATEMENT: About 30 min into the drive, the pain is severe. Rode instead of drove and was not near as bad but I was shifting and leaning back. It does shoot up my Rt side/back from the hip area (pointing from ASIS to lateral end of floating rib.   BOWEL MOVEMENT: Pain with bowel movement: No Type of bowel movement:Type (Bristol Stool Scale) 4, Frequency daily, and Strain No Fully empty rectum: Yes:  Leakage: No Pads: No Fiber supplement: no  URINATION: Pain with urination: No Fully empty bladder: Yes:  Stream: Strong Urgency: Yes: sometimes after coffee Frequency: about an 1 hour Leakage: jumping, coughing Pads: No  INTERCOURSE: Pain with intercourse: not painful, but a little dry Ability to have vaginal penetration:  Yes:  Climax: harder to achieve  Marinoff Scale: 0/3  PREGNANCY: Vaginal deliveries 2 Tearing No C-section deliveries 0 Currently pregnant No  PROLAPSE: None    11/26/24: PERTINENT HISTORY:  fibromyalgia  PAIN:  Are you having pain? Yes: NPRS scale: 4 Pain location: lateral to Rt SIJ Pain description: tight, knot Aggravating factors: sitting in car Relieving factors: massage   PRECAUTIONS:  None  RED FLAGS: None   WEIGHT BEARING RESTRICTIONS:  No  FALLS:  Has patient fallen in last 6 months? No  OCCUPATION:  Retired  form CA center at [employer]  PLOF:  Independent  PATIENT GOALS:  Bend without pain, drive, be active and flexible, work outside   OBJECTIVE:  Note: Objective measures were completed at Evaluation unless otherwise noted. 12/30/23 PALPATION:   General  no TTP at abdominals, tightness at Rt oblique, thoracic paraspinals, and piriformis                External Perineal Exam no TTP, WFL, mild dryness                             Internal Pelvic Floor TTP and tightness at Rt obturator   Patient confirms identification and approves PT to assess internal pelvic floor and treatment Yes  PELVIC MMT:   MMT eval  Vaginal 4/5, 8s, 7 reps  Internal Anal Sphincter   External Anal Sphincter   Puborectalis   Diastasis Recti   (Blank rows = not tested)        TONE: WFL  PROLAPSE: Not seen in hooklying    11/26/24 DIAGNOSTIC FINDINGS:  Knee MRI 12/14 in imaging tab  PATIENT SURVEYS:  FOTO 53 - FOTO ended 02/02/24 LEFS 58  COGNITIVE STATUS: Within functional limits for tasks assessed   SENSATION: WFL   GAIT: Comments: Rt trendelenburg   Body Part #1 Hip  PALPATION: Eval: limited Lt SIJ mobility   MMT (lb) Right 12/20 eval Left 12/20 eval  Hip flexion- seated 30.8 21.8  Hip extension    Hip abduction- sidelying 29 25.3  Hip adduction     (Blank rows = not tested)  TREATMENT: Treatment                            2/25: Blank lines following charge title = not provided on this treatment date.   Manual:  TPDN YES Trigger Point Dry Needling  Subsequent Treatment: Instructions provided previously at initial dry needling treatment.   Patient Verbal Consent Given: Yes Education Handout Provided: Previously Provided Muscles Treated: Rt glut med/min, piriformis Electrical Stimulation Performed: No Treatment Response/Outcome: decreased  concordant tightness  There-ex: Reviewed HEP There-Act:  Self Care:  Nuro-Re-ed:  Gait Training:    Treatment                            2/6: Blank lines following charge title = not provided on this treatment date.   Manual:  TPDN YES Trigger Point Dry Needling  Subsequent Treatment: Instructions provided previously at initial dry needling treatment.   Patient Verbal Consent Given: Yes Education Handout Provided: Previously Provided Muscles Treated: Rt glut max, piriformis Electrical Stimulation Performed: No Treatment Response/Outcome: twitch with decreased spasm Cross friction massage following DN There-ex: Standing mod pigeon pose right leg Plank squat- glut activation to return to upright Sidelying hip burner series- abd, circles, arcs There-Act: Hip hinge progressed to lifting 15lb kettle bell to simulate lifting child Self Care:  Nuro-Re-ed:  Gait Training:    Treatment                            2/3:  Gait- glut activation at heel strike, arm swing/trunk rotation Piriformis stretch LTR Bridge with adduction squeezes on ball Ball bw knees table plank<>squat, glut set/core engagement Table plank- UE step out/in Walking with 1lb weights for oblique activation in walking Child pose modified to standing knee flexion/ext in L stretch Hip hinge in standing with dowel Trigger Point Dry Needling  Subsequent Treatment: Instructions provided previously at initial dry needling treatment.   Patient Verbal Consent Given: Yes Education Handout Provided: Previously Provided Muscles Treated: Rt glut max, TFL, piriformis Electrical Stimulation Performed: No Treatment Response/Outcome: twitch with decreased concordant pain, resultant soreness as expected       PATIENT EDUCATION:  Education details: Anatomy of condition, POC, HEP, exercise form/rationale Person educated: Patient Education method: Explanation, Demonstration, Tactile cues, Verbal cues, and  Handouts Education comprehension: verbalized understanding, returned demonstration, verbal cues required, tactile cues required, and needs further education  HOME EXERCISE PROGRAM: Wear lift, ball bw knees when seated Access Code: [email or web address] URL: [email or web address] Date: 12/30/2023 Prepared by: [facility] - [facility]patient Re[facility] - [facility]    ASSESSMENT:  CLINICAL IMPRESSION: DN to right hip as tightness has returned since returning from drive to [place]. Will d/c for now as pt is going to request MRI from MD at f/u tomorrow. I do think an MRI would be beneficial at this point as she has made significant progress with continued return of symptoms. Will continue with HEP for general stability and biomechanics training in ADLs with respect to pain.    REHAB POTENTIAL: Good  CLINICAL DECISION MAKING: Stable/uncomplicated  EVALUATION COMPLEXITY: Low   GOALS: Goals reviewed with patient? Yes  SHORT TERM GOALS: Target date: 12/19/23  Determine need for heel lift Baseline: Goal status: achieved  2.  Good mobility maintained in Lt SIJ between sessions Baseline:  Goal status: achieved   LONG TERM GOALS: Target date: POC date  Meet FOTO goal Baseline:  Goal status: deferred- FOTO closed  2.  Ambulation without trendelenburg Baseline: able to decrease with awareness of glut engagement in stance phase but is sore when she does so.  Goal status: partially met  3.  Drive to [place] without need for stop due to pain Baseline: severe pain even when beginning to drive in a good day Goal status: not met  4.  Forward flexion without burning in buttocks Baseline: proper form with hip hinge but still burns until she straightens up. Goal status: not met  5.  Independent in long term HEP Baseline:  Goal status: INITIAL    PLAN:  PT FREQUENCY: 1-2x/week  PT DURATION: POC date  PLANNED INTERVENTIONS: 97164- PT Re-evaluation, 97110-Therapeutic exercises,  97530- Therapeutic activity, 97112- Neuromuscular re-education, 97535- Self Care, 16109- Manual therapy, [phone]- Gait training, [phone]- Aquatic Therapy, Patient/Family education, Balance training, Stair training, Taping, Dry Needling, Joint mobilization, Spinal mobilization, Cryotherapy, and Moist heat.     Sharlynn Seckinger C. Kiari Hosmer PT, DPT 02/02/24 12:34 PM

## 2024-02-03 ENCOUNTER — Ambulatory Visit (HOSPITAL_BASED_OUTPATIENT_CLINIC_OR_DEPARTMENT_OTHER): Payer: BC Managed Care – PPO | Admitting: Orthopaedic Surgery

## 2024-02-03 DIAGNOSIS — M25551 Pain in right hip: Secondary | ICD-10-CM

## 2024-02-03 MED ORDER — LIDOCAINE HCL 1 % IJ SOLN
4.0000 mL | INTRAMUSCULAR | Status: AC | PRN
  Administered 2024-02-03: 4 mL

## 2024-02-03 MED ORDER — TRIAMCINOLONE ACETONIDE 40 MG/ML IJ SUSP
80.0000 mg | INTRAMUSCULAR | Status: AC | PRN
  Administered 2024-02-03: 80 mg via INTRA_ARTICULAR

## 2024-02-03 NOTE — Addendum Note (Signed)
 Addended by: Jeanella Cara on: 02/03/2024 12:06 PM   Modules accepted: Orders

## 2024-02-03 NOTE — Progress Notes (Signed)
 Chief Complaint: Right hip pain        History of Present Illness:    02/03/2024: Presents today for follow-up of her right hip.  She has been having posterior significant SI type pain for which she has been seeing Priscilla Underwood as well as Dr. Madelyn Brunner.  She did get some relief initially, right SI guided injection.  She has gotten most relief more recently.  She explains that her pain has been deep in the buttocks area on the right.   Surgical History:   None   PMH/PSH/Family History/Social History/Meds/Allergies:         Past Medical History:  Diagnosis Date  . Arthritis      osteo arthritis  . Depression    . Fatty liver    . Fibromyalgia    . GERD (gastroesophageal reflux disease)    . PONV (postoperative nausea and vomiting)               Past Surgical History:  Procedure Laterality Date  . 24 HOUR PH STUDY N/A 12/14/2019    Procedure: 24 HOUR PH STUDY;  Surgeon: Napoleon Form, MD;  Location: [facility];  Service: Endoscopy;  Laterality: N/A;  . BUNIONECTOMY   2004  . CARPAL TUNNEL RELEASE Left 06/15/2015    Procedure: LEFT CARPAL TUNNEL RELEASE;  Surgeon: Dominica Severin, MD;  Location: [facility];  Service: Orthopedics;  Laterality: Left;  . CARPAL TUNNEL RELEASE Right 08/31/2015    Procedure: RIGHT LIMITED OPEN CARPAL TUNNEL RELEASE;  Surgeon: Dominica Severin, MD;  Location: [facility];  Service: Orthopedics;  Laterality: Right;  . ESOPHAGEAL MANOMETRY N/A 12/14/2019    Procedure: ESOPHAGEAL MANOMETRY (EM);  Surgeon: Napoleon Form, MD;  Location: [facility];  Service: Endoscopy;  Laterality: N/A;  DR. Orvan Falconer  . LIPOMA EXCISION Right 05/28/2018    Procedure: EXCISION LIPOMA OF SCALP;  Surgeon: Osborn Coho, MD;  Location: [facility];  Service: ENT;  Laterality: Right;  . SHOULDER SURGERY   2010, 2008  . TARSAL TUNNEL RELEASE Left 2012  . TUBAL LIGATION   1985         Social History         Socioeconomic History  . Marital status: Married      Spouse name: Not on file  . Number of children: Not on file  . Years of education: Not on file  . Highest education level: Not on file  Occupational History  . Not on file  Tobacco Use  . Smoking status: Former      Types: Cigarettes  . Smokeless tobacco: Never  . Tobacco comments:      Quit 35 years ago  Vaping Use  . Vaping status: Never Used  Substance and Sexual Activity  . Alcohol use: No  . Drug use: No  . Sexual activity: Yes      Birth control/protection: Post-menopausal  Other Topics Concern  . Not on file  Social History Narrative  . Not on file    Social Drivers of Health        Financial Resource Strain: Low Risk  (11/20/2023)    Received from [facility]    Overall Financial Resource Strain (CARDIA)    . Difficulty of Paying Living Expenses: Not hard at all  Food Insecurity: No Food Insecurity (11/20/2023)    Received from [facility]    Hunger Vital Sign    . Worried About Programme researcher, broadcasting/film/video in the Last Year: Never true    . Ran Out of Food in the Last Year: Never true  Transportation Needs: No Transportation Needs (11/20/2023)    Received from [facility] - Transportation    . Lack of Transportation (Medical): No    . Lack of Transportation (Non-Medical): No  Physical Activity: Sufficiently Active (11/20/2023)    Received from [facility]    Exercise Vital Sign    . Days of Exercise per Week: 3 days    . Minutes of Exercise per Session: 60 min  Stress: No Stress Concern Present (11/20/2023)    Received from [facility] - Occupational Stress Questionnaire    . Feeling of Stress : Not at all  Social Connections: Socially Integrated (11/20/2023)    Received from [facility]    Social Network    . How would you rate your social network (family, work, friends)?: Good participation with social networks          Family History  Problem Relation Age of Onset  . Breast cancer Sister 10  . Breast cancer Paternal Aunt 36  . Lung cancer Mother    . Heart disease Father    . Heart failure Maternal Grandmother    . Lung cancer Maternal Grandfather    . Pancreatic cancer Paternal Grandmother    . Colon cancer Neg Hx    . Esophageal cancer Neg Hx    . Rectal cancer Neg Hx    . Stomach cancer Neg Hx          Allergies      Allergies  Allergen Reactions  . Ciprofloxacin Rash  . Avelox [Moxifloxacin Hcl In Nacl] Rash            Current Outpatient Medications  Medication Sig Dispense Refill  . albuterol (VENTOLIN HFA) 108 (90 Base) MCG/ACT inhaler Inhale 2 puffs into the lungs every 6 hours as needed 8.5 g 0  . amoxicillin-clavulanate (AUGMENTIN) 875-125 MG tablet Take 1 tablet by mouth every 12 (twelve) hours for 10 days. 20 tablet 0  . Azelastine HCl (ASTEPRO) 0.15 % SOLN Use 2 sprays in each nostril once daily. 30 mL 1  . Azelastine HCl (ASTEPRO) 0.15 % SOLN Use 2 sprays in each nostril once a day. 30 mL 1  . azithromycin (ZITHROMAX) 250 MG tablet Take 2 tablets by mouth on the first day, then take 1 tablet daily for 4 days. 6 tablet 0  . buPROPion (WELLBUTRIN XL) 150 MG 24 hr tablet TAKE 1 TABLET BY MOUTH IN THE MORNING 90 tablet 0  . buPROPion (WELLBUTRIN XL) 150 MG 24 hr tablet Take 1 tablet by mouth once daily in the morning 90 tablet 1  . buPROPion (WELLBUTRIN XL) 150 MG 24 hr tablet Take 1 tablet by mouth once daily in the morning 90 tablet 2  . buPROPion (WELLBUTRIN XL) 150 MG 24 hr tablet Take 1 tablet (150 mg) by mouth every morning. 30 tablet 1  . desvenlafaxine (PRISTIQ) 100 MG 24 hr tablet desvenlafaxine ER 100 mg tablet,extended release 24 hr  Take 1 tablet every day by oral route.      Marland Kitchen desvenlafaxine (PRISTIQ) 100 MG 24 hr tablet Take 1 tablet (100 mg total) by mouth daily. 90  tablet 1  . desvenlafaxine (PRISTIQ) 50 MG 24 hr tablet Take 1 tablet (50 mg total) by mouth  daily. 30 tablet 2  . desvenlafaxine (PRISTIQ) 50 MG 24 hr tablet Take 1 tablet (50 mg total) by mouth daily. 90 tablet 2  . diclofenac (VOLTAREN) 75 MG EC tablet Take 1 tablet (75 mg total) by mouth 2 (two) times daily as needed. 60 tablet 3  . diclofenac (VOLTAREN) 75 MG EC tablet Take 1 tablet (75 mg) by mouth daily for 90 days. 90 tablet 1  . doxycycline (ADOXA) 100 MG tablet Take 1 tablet twice daily for 10 days. 20 tablet 0  . esomeprazole (NEXIUM) 40 MG capsule Take 40 mg by mouth daily at 12 noon.      . famotidine (PEPCID) 10 MG tablet Take 10 mg by mouth daily.      Marland Kitchen gabapentin (NEURONTIN) 100 MG capsule Take 3 capsules (300 mg total) by mouth daily as directed 90 capsule 3  . methocarbamol (ROBAXIN) 500 MG tablet Take 500 mg by mouth every 8 (eight) hours as needed for muscle spasms.      . methocarbamol (ROBAXIN) 500 MG tablet Take 1 tablet (500 mg total) by mouth 3 (three) times daily for 30 days. 90 tablet 3  . metoprolol tartrate (LOPRESSOR) 25 MG tablet TAKE 1 TABLET 2 HOURS PRIOR TO CT 1 tablet 0  . mupirocin ointment (BACTROBAN) 2 % Apply to affected area 2 times a day for 14 days 22 g 0  . pantoprazole (PROTONIX) 40 MG tablet Take 1 tablet (40 mg total) by mouth 2 (two) times daily. 180 tablet 0  . promethazine-dextromethorphan (PROMETHAZINE-DM) 6.25-15 MG/5ML syrup Take 5 mL by mouth every 6 hours as needed for 10 days 200 mL 0  . promethazine-dextromethorphan (PROMETHAZINE-DM) 6.25-15 MG/5ML syrup Take every 6 hours as needed for 5 days. 100 mL 0  . sertraline (ZOLOFT) 100 MG tablet Take 100 mg by mouth.    5  . sucralfate (CARAFATE) 1 g tablet Take 1 g by mouth 4 (four) times daily -  with meals and at bedtime.      . sulfamethoxazole-trimethoprim (BACTRIM DS) 800-160 MG tablet Take 1 tablet by mouth 2 times a day for 14 days 28 tablet 0  . terbinafine (LAMISIL) 250 MG tablet Please take one a day x 7days, repeat every 4 weeks x 4 months 28 tablet 0  . terbinafine  (LAMISIL) 250 MG tablet Take 1 tablet (250 mg total) by mouth daily. 30 tablet 2  . traMADol (ULTRAM) 50 MG tablet Take 1 tablet  by mouth once a day as needed (30 days) 15 tablet 0  . valACYclovir (VALTREX) 1000 MG tablet as needed.    2  . valACYclovir (VALTREX) 500 MG tablet Take 1 tablet (500 mg) by mouth daily. 30 tablet 1  . zolpidem (AMBIEN) 5 MG tablet Take 5 mg by mouth at bedtime as needed for sleep.    1  . zolpidem (AMBIEN) 5 MG tablet Take 1 tablet (5 mg total) by mouth at bedtime as needed (please try NOT to take every evening) 30 tablet 3      No current facility-administered medications for this visit.      Imaging Results (Last 48 hours)  No results found.     Review of Systems:   A ROS was performed including pertinent positives and negatives as documented in the HPI.   Physical Exam :   Constitutional: NAD and appears stated age  Neurological: Alert and oriented Psych: Appropriate affect and cooperative There were no vitals taken for this visit.    Comprehensive Musculoskeletal Exam:     Right hip with tenderness about the posterior buttocks as well as the ischial tuberosity and SI joint.  There is pain with positive FADIR.  3 degrees internal/external rotation of the right hip   Imaging:   MRI (right knee): Small posterior lateral meniscus tear with Baker's cyst present in the posterior knee     I personally reviewed and interpreted the radiographs.     Assessment:   63 y.o. female here for follow-up of her right hip.  At this time she has previously had an injection of the right SI joint which gave her approximately 3 days of relief.  At this time she is continuing to have persistent hip and groin based pain as well as posterior buttocks pain.  Given this I do believe an MRI would be helpful to assess her glutes as well as her femoral acetabular joint.  I did discuss that right now my leading differential would be hip instability producing persistent pain  throughout the anterior and posterior buttocks.  That being said I would like to obtain an MRI of the right hip so we can further assess this.  She has worked on physical therapy for several months now without relief   Plan :     -Plan for MRI right hip and follow-up discuss results    Procedure Note  Patient: Priscilla Underwood             Date of Birth: 06/14/1961           MRN: [id number]             Visit Date: 02/03/2024  Procedures: Visit Diagnoses: No diagnosis found.  Large Joint Inj: R hip joint on 02/03/2024 12:04 PM Indications: pain Details: 22 G 3.5 in needle, ultrasound-guided anterolateral approach  Arthrogram: No  Medications: 4 mL lidocaine 1 %; 80 mg triamcinolone acetonide 40 MG/ML Outcome: tolerated well, no immediate complications Procedure, treatment alternatives, risks and benefits explained, specific risks discussed. Consent was given by the patient. Immediately prior to procedure a time out was called to verify the correct patient, procedure, equipment, support staff and site/side marked as required. Patient was prepped and draped in the usual sterile fashion.              I personally saw and evaluated the patient, and participated in the management and treatment plan.

## 2024-02-04 ENCOUNTER — Ambulatory Visit (HOSPITAL_BASED_OUTPATIENT_CLINIC_OR_DEPARTMENT_OTHER): Payer: BC Managed Care – PPO | Admitting: Physical Therapy

## 2024-02-09 ENCOUNTER — Encounter (HOSPITAL_BASED_OUTPATIENT_CLINIC_OR_DEPARTMENT_OTHER): Payer: Self-pay | Admitting: Orthopaedic Surgery

## 2024-02-10 ENCOUNTER — Encounter (HOSPITAL_BASED_OUTPATIENT_CLINIC_OR_DEPARTMENT_OTHER): Payer: BC Managed Care – PPO | Admitting: Physical Therapy

## 2024-02-11 ENCOUNTER — Encounter (HOSPITAL_BASED_OUTPATIENT_CLINIC_OR_DEPARTMENT_OTHER): Payer: Self-pay | Admitting: Orthopaedic Surgery

## 2024-02-12 NOTE — Telephone Encounter (Signed)
 I emailed Courtney with [facility] imaging the order and demo. Waiting on auth from [employer]. Once that is approved please send to [facility] imaging.

## 2024-02-13 ENCOUNTER — Encounter (HOSPITAL_BASED_OUTPATIENT_CLINIC_OR_DEPARTMENT_OTHER): Payer: BC Managed Care – PPO | Admitting: Physical Therapy

## 2024-02-17 ENCOUNTER — Encounter (HOSPITAL_BASED_OUTPATIENT_CLINIC_OR_DEPARTMENT_OTHER): Payer: BC Managed Care – PPO | Admitting: Physical Therapy

## 2024-02-17 ENCOUNTER — Other Ambulatory Visit: Payer: BC Managed Care – PPO

## 2024-02-20 ENCOUNTER — Encounter (HOSPITAL_BASED_OUTPATIENT_CLINIC_OR_DEPARTMENT_OTHER): Payer: BC Managed Care – PPO | Admitting: Physical Therapy

## 2024-02-23 ENCOUNTER — Encounter (HOSPITAL_BASED_OUTPATIENT_CLINIC_OR_DEPARTMENT_OTHER): Payer: BC Managed Care – PPO | Admitting: Physical Therapy

## 2024-02-23 NOTE — Telephone Encounter (Signed)
 Order ID: [id number]       Authorized   Approval Valid Through: 02/12/2024 - 03/12/2024   Teams Terri to see if can send to courtney since I am not able to fax or have a stamp

## 2024-02-25 DIAGNOSIS — S76311A Strain of muscle, fascia and tendon of the posterior muscle group at thigh level, right thigh, initial encounter: Secondary | ICD-10-CM | POA: Diagnosis not present

## 2024-02-25 DIAGNOSIS — S73191A Other sprain of right hip, initial encounter: Secondary | ICD-10-CM | POA: Diagnosis not present

## 2024-02-25 DIAGNOSIS — M1611 Unilateral primary osteoarthritis, right hip: Secondary | ICD-10-CM | POA: Diagnosis not present

## 2024-02-25 DIAGNOSIS — R600 Localized edema: Secondary | ICD-10-CM | POA: Diagnosis not present

## 2024-02-25 DIAGNOSIS — M160 Bilateral primary osteoarthritis of hip: Secondary | ICD-10-CM | POA: Diagnosis not present

## 2024-02-25 DIAGNOSIS — M6289 Other specified disorders of muscle: Secondary | ICD-10-CM | POA: Diagnosis not present

## 2024-02-27 ENCOUNTER — Encounter (HOSPITAL_BASED_OUTPATIENT_CLINIC_OR_DEPARTMENT_OTHER): Payer: BC Managed Care – PPO | Admitting: Physical Therapy

## 2024-03-01 ENCOUNTER — Telehealth (HOSPITAL_BASED_OUTPATIENT_CLINIC_OR_DEPARTMENT_OTHER): Payer: Self-pay | Admitting: Orthopaedic Surgery

## 2024-03-01 DIAGNOSIS — Z79899 Other long term (current) drug therapy: Secondary | ICD-10-CM | POA: Diagnosis not present

## 2024-03-01 DIAGNOSIS — M79672 Pain in left foot: Secondary | ICD-10-CM | POA: Diagnosis not present

## 2024-03-01 DIAGNOSIS — M1991 Primary osteoarthritis, unspecified site: Secondary | ICD-10-CM | POA: Diagnosis not present

## 2024-03-01 DIAGNOSIS — M797 Fibromyalgia: Secondary | ICD-10-CM | POA: Diagnosis not present

## 2024-03-01 NOTE — Telephone Encounter (Signed)
 Lvm and advised that yes we have results but to please bring disc to appointment for Dr. Steward Drone to review

## 2024-03-01 NOTE — Telephone Encounter (Signed)
 Patient wants to know if Dr. Leonard Schwartz has her MRI results for [facility] or she cx her appointment for tomorrow

## 2024-03-02 ENCOUNTER — Encounter (HOSPITAL_BASED_OUTPATIENT_CLINIC_OR_DEPARTMENT_OTHER): Payer: Self-pay | Admitting: Orthopaedic Surgery

## 2024-03-02 ENCOUNTER — Ambulatory Visit (HOSPITAL_BASED_OUTPATIENT_CLINIC_OR_DEPARTMENT_OTHER): Payer: BC Managed Care – PPO | Admitting: Orthopaedic Surgery

## 2024-03-02 DIAGNOSIS — M25551 Pain in right hip: Secondary | ICD-10-CM | POA: Diagnosis not present

## 2024-03-02 MED ORDER — LIDOCAINE HCL 1 % IJ SOLN
4.0000 mL | INTRAMUSCULAR | Status: AC | PRN
  Administered 2024-03-02: 4 mL

## 2024-03-02 MED ORDER — TRIAMCINOLONE ACETONIDE 40 MG/ML IJ SUSP
80.0000 mg | INTRAMUSCULAR | Status: AC | PRN
  Administered 2024-03-02: 80 mg via INTRA_ARTICULAR

## 2024-03-02 NOTE — Progress Notes (Signed)
 Chief Complaint: Right hip pain        History of Present Illness:    03/02/2024: Presents today for follow-up of her right hip.  Unfortunately she did not get any relief from her femoral acetabular injection.  She is here today for further MRI discussion   Surgical History:   None   PMH/PSH/Family History/Social History/Meds/Allergies:         Past Medical History:  Diagnosis Date  . Arthritis      osteo arthritis  . Depression    . Fatty liver    . Fibromyalgia    . GERD (gastroesophageal reflux disease)    . PONV (postoperative nausea and vomiting)               Past Surgical History:  Procedure Laterality Date  . 24 HOUR PH STUDY N/A 12/14/2019    Procedure: 24 HOUR PH STUDY;  Surgeon: Napoleon Form, MD;  [facility];  Service: Endoscopy;  Laterality: N/A;  . BUNIONECTOMY   2004  . CARPAL TUNNEL RELEASE Left 06/15/2015    Procedure: LEFT CARPAL TUNNEL RELEASE;  Surgeon: Dominica Severin, MD;  Location: [facility];  Service: Orthopedics;  Laterality: Left;  . CARPAL TUNNEL RELEASE Right 08/31/2015    Procedure: RIGHT LIMITED OPEN CARPAL TUNNEL RELEASE;  Surgeon: Dominica Severin, MD;  Location: [facility];  Service: Orthopedics;  Laterality: Right;  . ESOPHAGEAL MANOMETRY N/A 12/14/2019    Procedure: ESOPHAGEAL MANOMETRY (EM);  Surgeon: Napoleon Form, MD;  [facility];  Service: Endoscopy;  Laterality: N/A;  DR. Orvan Falconer  . LIPOMA EXCISION Right 05/28/2018    Procedure: EXCISION LIPOMA OF SCALP;  Surgeon: Osborn Coho, MD;  Location: [facility];  Service: ENT;  Laterality: Right;  . SHOULDER SURGERY   2010, 2008  . TARSAL TUNNEL RELEASE Left 2012  . TUBAL LIGATION   1985        Social History         Socioeconomic History  . Marital status: Married      Spouse name: Not on file  . Number of children: Not on file  . Years of education: Not on file  .  Highest education level: Not on file  Occupational History  . Not on file  Tobacco Use  . Smoking status: Former      Types: Cigarettes  . Smokeless tobacco: Never  . Tobacco comments:      Quit 35 years ago  Vaping Use  . Vaping status: Never Used  Substance and Sexual Activity  . Alcohol use: No  . Drug use: No  . Sexual activity: Yes      Birth control/protection: Post-menopausal  Other Topics Concern  . Not on file  Social History Narrative  . Not on file    Social Drivers of Health        Financial Resource Strain: Low Risk  (11/20/2023)    Received from [facility]    Overall Financial Resource Strain (CARDIA)    . Difficulty of Paying Living Expenses: Not hard at all  Food Insecurity: No Food Insecurity (11/20/2023)    Received from [facility]    Hunger Vital Sign    . Worried About Programme researcher, broadcasting/film/video in the Last Year: Never true    .  Ran Out of Food in the Last Year: Never true  Transportation Needs: No Transportation Needs (11/20/2023)    Received from [facility] - Transportation    . Lack of Transportation (Medical): No    . Lack of Transportation (Non-Medical): No  Physical Activity: Sufficiently Active (11/20/2023)    Received from [facility]    Exercise Vital Sign    . Days of Exercise per Week: 3 days    . Minutes of Exercise per Session: 60 min  Stress: No Stress Concern Present (11/20/2023)    Received from [facility] - Occupational Stress Questionnaire    . Feeling of Stress : Not at all  Social Connections: Socially Integrated (11/20/2023)    Received from [facility]    Social Network    . How would you rate your social network (family, work, friends)?: Good participation with social networks         Family History  Problem Relation Age of Onset  . Breast cancer Sister 38  . Breast cancer Paternal Aunt 27  . Lung cancer Mother    . Heart disease Father    . Heart  failure Maternal Grandmother    . Lung cancer Maternal Grandfather    . Pancreatic cancer Paternal Grandmother    . Colon cancer Neg Hx    . Esophageal cancer Neg Hx    . Rectal cancer Neg Hx    . Stomach cancer Neg Hx          Allergies      Allergies  Allergen Reactions  . Ciprofloxacin Rash  . Avelox [Moxifloxacin Hcl In Nacl] Rash            Current Outpatient Medications  Medication Sig Dispense Refill  . albuterol (VENTOLIN HFA) 108 (90 Base) MCG/ACT inhaler Inhale 2 puffs into the lungs every 6 hours as needed 8.5 g 0  . amoxicillin-clavulanate (AUGMENTIN) 875-125 MG tablet Take 1 tablet by mouth every 12 (twelve) hours for 10 days. 20 tablet 0  . Azelastine HCl (ASTEPRO) 0.15 % SOLN Use 2 sprays in each nostril once daily. 30 mL 1  . Azelastine HCl (ASTEPRO) 0.15 % SOLN Use 2 sprays in each nostril once a day. 30 mL 1  . azithromycin (ZITHROMAX) 250 MG tablet Take 2 tablets by mouth on the first day, then take 1 tablet daily for 4 days. 6 tablet 0  . buPROPion (WELLBUTRIN XL) 150 MG 24 hr tablet TAKE 1 TABLET BY MOUTH IN THE MORNING 90 tablet 0  . buPROPion (WELLBUTRIN XL) 150 MG 24 hr tablet Take 1 tablet by mouth once daily in the morning 90 tablet 1  . buPROPion (WELLBUTRIN XL) 150 MG 24 hr tablet Take 1 tablet by mouth once daily in the morning 90 tablet 2  . buPROPion (WELLBUTRIN XL) 150 MG 24 hr tablet Take 1 tablet (150 mg) by mouth every morning. 30 tablet 1  . desvenlafaxine (PRISTIQ) 100 MG 24 hr tablet desvenlafaxine ER 100 mg tablet,extended release 24 hr  Take 1 tablet every day by oral route.      Marland Kitchen desvenlafaxine (PRISTIQ) 100 MG 24 hr tablet Take 1 tablet (100 mg total) by mouth daily. 90 tablet 1  . desvenlafaxine (PRISTIQ) 50 MG 24 hr tablet Take 1 tablet (50 mg total) by mouth daily. 30 tablet 2  . desvenlafaxine (PRISTIQ) 50 MG 24 hr tablet Take 1 tablet (50 mg total) by  mouth daily. 90 tablet 2  . diclofenac (VOLTAREN) 75 MG EC tablet Take 1 tablet (75  mg total) by mouth 2 (two) times daily as needed. 60 tablet 3  . diclofenac (VOLTAREN) 75 MG EC tablet Take 1 tablet (75 mg) by mouth daily for 90 days. 90 tablet 1  . doxycycline (ADOXA) 100 MG tablet Take 1 tablet twice daily for 10 days. 20 tablet 0  . esomeprazole (NEXIUM) 40 MG capsule Take 40 mg by mouth daily at 12 noon.      . famotidine (PEPCID) 10 MG tablet Take 10 mg by mouth daily.      Marland Kitchen gabapentin (NEURONTIN) 100 MG capsule Take 3 capsules (300 mg total) by mouth daily as directed 90 capsule 3  . methocarbamol (ROBAXIN) 500 MG tablet Take 500 mg by mouth every 8 (eight) hours as needed for muscle spasms.      . methocarbamol (ROBAXIN) 500 MG tablet Take 1 tablet (500 mg total) by mouth 3 (three) times daily for 30 days. 90 tablet 3  . metoprolol tartrate (LOPRESSOR) 25 MG tablet TAKE 1 TABLET 2 HOURS PRIOR TO CT 1 tablet 0  . mupirocin ointment (BACTROBAN) 2 % Apply to affected area 2 times a day for 14 days 22 g 0  . pantoprazole (PROTONIX) 40 MG tablet Take 1 tablet (40 mg total) by mouth 2 (two) times daily. 180 tablet 0  . promethazine-dextromethorphan (PROMETHAZINE-DM) 6.25-15 MG/5ML syrup Take 5 mL by mouth every 6 hours as needed for 10 days 200 mL 0  . promethazine-dextromethorphan (PROMETHAZINE-DM) 6.25-15 MG/5ML syrup Take every 6 hours as needed for 5 days. 100 mL 0  . sertraline (ZOLOFT) 100 MG tablet Take 100 mg by mouth.    5  . sucralfate (CARAFATE) 1 g tablet Take 1 g by mouth 4 (four) times daily -  with meals and at bedtime.      . sulfamethoxazole-trimethoprim (BACTRIM DS) 800-160 MG tablet Take 1 tablet by mouth 2 times a day for 14 days 28 tablet 0  . terbinafine (LAMISIL) 250 MG tablet Please take one a day x 7days, repeat every 4 weeks x 4 months 28 tablet 0  . terbinafine (LAMISIL) 250 MG tablet Take 1 tablet (250 mg total) by mouth daily. 30 tablet 2  . traMADol (ULTRAM) 50 MG tablet Take 1 tablet  by mouth once a day as needed (30 days) 15 tablet 0  .  valACYclovir (VALTREX) 1000 MG tablet as needed.    2  . valACYclovir (VALTREX) 500 MG tablet Take 1 tablet (500 mg) by mouth daily. 30 tablet 1  . zolpidem (AMBIEN) 5 MG tablet Take 5 mg by mouth at bedtime as needed for sleep.    1  . zolpidem (AMBIEN) 5 MG tablet Take 1 tablet (5 mg total) by mouth at bedtime as needed (please try NOT to take every evening) 30 tablet 3      No current facility-administered medications for this visit.      Imaging Results (Last 48 hours)  No results found.     Review of Systems:   A ROS was performed including pertinent positives and negatives as documented in the HPI.   Physical Exam :   Constitutional: NAD and appears stated age Neurological: Alert and oriented Psych: Appropriate affect and cooperative There were no vitals taken for this visit.    Comprehensive Musculoskeletal Exam:     Right hip with tenderness about the posterior buttocks as well as the  ischial tuberosity and SI joint.  There is pain with positive FADIR.  3 degrees internal/external rotation of the right hip   Imaging:    Right hip MRI: Small undersurface tear of the gluteus medius minimus junction   I personally reviewed and interpreted the radiographs.     Assessment:   63 y.o. female here for follow-up of her right hip.  At this time she has previously had an injection of the right SI joint which gave her approximately 3 days of relief.  At this time she is continuing to have persistent hip and groin based pain as well as posterior buttocks pain.  I did discuss that her MRI does show evidence of a small gluteus medius undersurface tear and at this point given the fact that she did not have any relief from a femoral acetabular injection I would like to provide a greater trochanteric injections that we can rule out this as her source of underlying pain.  I will plan to see her back in 2 weeks for reassessment Plan :     -Clinic 2 weeks for reassessment    Procedure  Note  Patient: Priscilla Underwood             Date of Birth: Mar 28, 1961           MRN: [id number]             Visit Date: 03/02/2024  Procedures: Visit Diagnoses: No diagnosis found.  Large Joint Inj: R greater trochanter on 03/02/2024 12:20 PM Indications: pain Details: 22 G 3.5 in needle, ultrasound-guided anterolateral approach  Arthrogram: No  Medications: 4 mL lidocaine 1 %; 80 mg triamcinolone acetonide 40 MG/ML Outcome: tolerated well, no immediate complications Procedure, treatment alternatives, risks and benefits explained, specific risks discussed. Consent was given by the patient. Immediately prior to procedure a time out was called to verify the correct patient, procedure, equipment, support staff and site/side marked as required. Patient was prepped and draped in the usual sterile fashion.             I personally saw and evaluated the patient, and participated in the management and treatment plan.

## 2024-03-16 ENCOUNTER — Ambulatory Visit (HOSPITAL_BASED_OUTPATIENT_CLINIC_OR_DEPARTMENT_OTHER): Admitting: Orthopaedic Surgery

## 2024-03-17 ENCOUNTER — Emergency Department (HOSPITAL_COMMUNITY)

## 2024-03-17 ENCOUNTER — Emergency Department (HOSPITAL_COMMUNITY)
Admission: EM | Admit: 2024-03-17 | Discharge: 2024-03-17 | Disposition: A | Discharge status: Home / Self Care | Attending: Emergency Medicine | Admitting: Emergency Medicine

## 2024-03-17 ENCOUNTER — Encounter (HOSPITAL_COMMUNITY): Payer: Self-pay

## 2024-03-17 ENCOUNTER — Other Ambulatory Visit: Payer: Self-pay

## 2024-03-17 DIAGNOSIS — R079 Chest pain, unspecified: Secondary | ICD-10-CM | POA: Diagnosis not present

## 2024-03-17 DIAGNOSIS — N281 Cyst of kidney, acquired: Secondary | ICD-10-CM | POA: Diagnosis not present

## 2024-03-17 DIAGNOSIS — K219 Gastro-esophageal reflux disease without esophagitis: Secondary | ICD-10-CM | POA: Diagnosis not present

## 2024-03-17 DIAGNOSIS — R1013 Epigastric pain: Secondary | ICD-10-CM

## 2024-03-17 DIAGNOSIS — I499 Cardiac arrhythmia, unspecified: Secondary | ICD-10-CM | POA: Diagnosis not present

## 2024-03-17 DIAGNOSIS — R0689 Other abnormalities of breathing: Secondary | ICD-10-CM | POA: Insufficient documentation

## 2024-03-17 DIAGNOSIS — R112 Nausea with vomiting, unspecified: Secondary | ICD-10-CM | POA: Diagnosis present

## 2024-03-17 DIAGNOSIS — I4891 Unspecified atrial fibrillation: Secondary | ICD-10-CM | POA: Diagnosis not present

## 2024-03-17 DIAGNOSIS — R000 Tachycardia, unspecified: Secondary | ICD-10-CM | POA: Diagnosis not present

## 2024-03-17 DIAGNOSIS — R0902 Hypoxemia: Secondary | ICD-10-CM | POA: Diagnosis not present

## 2024-03-17 DIAGNOSIS — A419 Sepsis, unspecified organism: Secondary | ICD-10-CM | POA: Diagnosis not present

## 2024-03-17 DIAGNOSIS — R197 Diarrhea, unspecified: Secondary | ICD-10-CM | POA: Insufficient documentation

## 2024-03-17 DIAGNOSIS — K449 Diaphragmatic hernia without obstruction or gangrene: Secondary | ICD-10-CM | POA: Diagnosis not present

## 2024-03-17 DIAGNOSIS — R0602 Shortness of breath: Secondary | ICD-10-CM | POA: Diagnosis not present

## 2024-03-17 DIAGNOSIS — K5730 Diverticulosis of large intestine without perforation or abscess without bleeding: Secondary | ICD-10-CM | POA: Diagnosis not present

## 2024-03-17 LAB — COMPREHENSIVE METABOLIC PANEL WITH GFR
ALT: 24 U/L (ref 0–44)
AST: 24 U/L (ref 15–41)
Albumin: 3.6 g/dL (ref 3.5–5.0)
Alkaline Phosphatase: 46 U/L (ref 38–126)
Anion gap: 12 (ref 5–15)
BUN: 16 mg/dL (ref 8–23)
CO2: 22 mmol/L (ref 22–32)
Calcium: 9 mg/dL (ref 8.9–10.3)
Chloride: 106 mmol/L (ref 98–111)
Creatinine, Ser: 0.79 mg/dL (ref 0.44–1.00)
GFR, Estimated: >60 mL/min (ref >60)
Glucose, Bld: 158 mg/dL — ABNORMAL HIGH (ref 70–99)
Potassium: 3.7 mmol/L (ref 3.5–5.1)
Sodium: 140 mmol/L (ref 135–145)
Total Bilirubin: 0.9 mg/dL (ref 0.0–1.2)
Total Protein: 6.3 g/dL — ABNORMAL LOW (ref 6.5–8.1)

## 2024-03-17 LAB — CBC WITH DIFFERENTIAL/PLATELET
Abs Immature Granulocytes: 0.02 10*3/uL (ref 0.00–0.07)
Basophils Absolute: 0 10*3/uL (ref 0.0–0.1)
Basophils Relative: 0 %
Eosinophils Absolute: 0.1 10*3/uL (ref 0.0–0.5)
Eosinophils Relative: 1 %
HCT: 44.5 % (ref 36.0–46.0)
Hemoglobin: 14.6 g/dL (ref 12.0–15.0)
Immature Granulocytes: 0 %
Lymphocytes Relative: 5 %
Lymphs Abs: 0.4 10*3/uL — ABNORMAL LOW (ref 0.7–4.0)
MCH: 31.7 pg (ref 26.0–34.0)
MCHC: 32.8 g/dL (ref 30.0–36.0)
MCV: 96.5 fL (ref 80.0–100.0)
Monocytes Absolute: 0.3 10*3/uL (ref 0.1–1.0)
Monocytes Relative: 5 %
Neutro Abs: 6.1 10*3/uL (ref 1.7–7.7)
Neutrophils Relative %: 89 %
Platelets: 177 10*3/uL (ref 150–400)
RBC: 4.61 MIL/uL (ref 3.87–5.11)
RDW: 13.6 % (ref 11.5–15.5)
WBC: 6.9 10*3/uL (ref 4.0–10.5)
nRBC: 0 % (ref 0.0–0.2)

## 2024-03-17 LAB — URINALYSIS, ROUTINE W REFLEX MICROSCOPIC
Bilirubin Urine: NEGATIVE
Glucose, UA: NEGATIVE mg/dL
Hgb urine dipstick: NEGATIVE
Ketones, ur: 5 mg/dL — AB
Leukocytes,Ua: NEGATIVE
Nitrite: NEGATIVE
Protein, ur: NEGATIVE mg/dL
Specific Gravity, Urine: >1.046 — ABNORMAL HIGH (ref 1.005–1.030)
pH: 5 (ref 5.0–8.0)

## 2024-03-17 LAB — TSH: TSH: 2.73 u[IU]/mL (ref 0.350–4.500)

## 2024-03-17 LAB — LIPASE, BLOOD: Lipase: 24 U/L (ref 11–51)

## 2024-03-17 LAB — TROPONIN I (HIGH SENSITIVITY): Troponin I (High Sensitivity): 15 ng/L (ref <18)

## 2024-03-17 LAB — BRAIN NATRIURETIC PEPTIDE: B Natriuretic Peptide: 42.9 pg/mL (ref 0.0–100.0)

## 2024-03-17 LAB — I-STAT CG4 LACTIC ACID, ED: Lactic Acid, Venous: 1.5 mmol/L (ref 0.5–1.9)

## 2024-03-17 MED ORDER — IOHEXOL 350 MG/ML SOLN
65.0000 mL | Freq: Once | INTRAVENOUS | Status: AC | PRN
  Administered 2024-03-17: 65 mL via INTRAVENOUS

## 2024-03-17 MED ORDER — LACTATED RINGERS IV BOLUS
500.0000 mL | Freq: Once | INTRAVENOUS | Status: AC
  Administered 2024-03-17: 500 mL via INTRAVENOUS

## 2024-03-17 MED ORDER — SODIUM CHLORIDE 0.9 % IV BOLUS
1000.0000 mL | Freq: Once | INTRAVENOUS | Status: AC
  Administered 2024-03-17: 1000 mL via INTRAVENOUS

## 2024-03-17 MED ORDER — ONDANSETRON HCL 4 MG/2ML IJ SOLN
4.0000 mg | Freq: Once | INTRAMUSCULAR | Status: AC
  Administered 2024-03-17: 4 mg via INTRAVENOUS
  Filled 2024-03-17: qty 2

## 2024-03-17 MED ORDER — SODIUM CHLORIDE 0.9 % IV BOLUS
500.0000 mL | Freq: Once | INTRAVENOUS | Status: AC
  Administered 2024-03-17: 500 mL via INTRAVENOUS

## 2024-03-17 MED ORDER — NOREPINEPHRINE 4 MG/250ML-% IV SOLN
0.0000 ug/min | INTRAVENOUS | Status: DC
  Administered 2024-03-17: 2 ug/min via INTRAVENOUS
  Filled 2024-03-17: qty 250

## 2024-03-17 MED ORDER — ACETAMINOPHEN 500 MG PO TABS
1000.0000 mg | ORAL_TABLET | Freq: Once | ORAL | Status: AC
  Administered 2024-03-17: 1000 mg via ORAL
  Filled 2024-03-17: qty 2

## 2024-03-17 MED ORDER — LACTATED RINGERS IV SOLN: INTRAVENOUS | Status: DC

## 2024-03-17 MED ORDER — PEDIALYTE PO SOLN
1000.0000 mL | Freq: Once | ORAL | Status: AC
  Administered 2024-03-17: 1000 mL via ORAL

## 2024-03-17 MED ORDER — ACETAMINOPHEN 325 MG PO TABS
650.0000 mg | ORAL_TABLET | Freq: Once | ORAL | Status: AC
  Administered 2024-03-17: 650 mg via ORAL
  Filled 2024-03-17: qty 2

## 2024-03-17 NOTE — ED Provider Notes (Signed)
 Rate recent from previous provider, please see his note for complete H&P.  63 year old female presenting with complaint of abdominal pain with associate nausea vomiting diarrhea that started since last night.  She report she ate food at the church earlier in the day.  She was also noted to be tachycardic with A-fib RVR.  This is new onset A-fib.  She received antiemetic, IV fluid and the symptoms did not improve.  Patient was monitored in the ER with continuing hydration both with IV and by p.o.  Her labs overall reassuring and CT scan of abdomen pelvis independently viewed interpreted by me showing no acute finding.  Agree with radiology interpretation.  Chest x-ray is normal.  She started to feel little bit better however she remains hypotensive.  She is no longer tachycardic and heart rate normalized.  She is now in sinus.  Her Chad2vasc score is 1. Pt also has a fever.  Tylenol  given.  No nuchal rigidity to suggest meningitis.     Blood pressure remains low with last reading of 81/50.  Patient have received a total of 2 and half liters of fluid.  Will start pressors and consult PCCM for admission.  1:16 PM Appreciate consultation from PCCM who agrees to see and will likely admit pt.  Sepsis work up initiated.  Will hold ABX as sxs suggestive of viral cause.    .Critical Care  Performed by: Debbra Fairy, PA-C Authorized by: Debbra Fairy, PA-C   Critical care provider statement:    Critical care time (minutes):  30   Critical care was time spent personally by me on the following activities:  Development of treatment plan with patient or surrogate, discussions with consultants, evaluation of patient's response to treatment, examination of patient, ordering and review of laboratory studies, ordering and review of radiographic studies, ordering and performing treatments and interventions, pulse oximetry, re-evaluation of patient's condition and review of old charts  BP (!) 81/50   Pulse 95   Temp  99.8 F (37.7 C) (Oral)   Resp (!) 23   SpO2 99%   Results for orders placed or performed during the hospital encounter of 03/17/24  Comprehensive metabolic panel   Collection Time: 03/17/24  3:21 AM  Result Value Ref Range   Sodium 140 135 - 145 mmol/L   Potassium 3.7 3.5 - 5.1 mmol/L   Chloride 106 98 - 111 mmol/L   CO2 22 22 - 32 mmol/L   Glucose, Bld 158 (H) 70 - 99 mg/dL   BUN 16 8 - 23 mg/dL   Creatinine, Ser 1.61 0.44 - 1.00 mg/dL   Calcium 9.0 8.9 - 09.6 mg/dL   Total Protein 6.3 (L) 6.5 - 8.1 g/dL   Albumin 3.6 3.5 - 5.0 g/dL   AST 24 15 - 41 U/L   ALT 24 0 - 44 U/L   Alkaline Phosphatase 46 38 - 126 U/L   Total Bilirubin 0.9 0.0 - 1.2 mg/dL   GFR, Estimated >04 >54 mL/min   Anion gap 12 5 - 15  Lipase, blood   Collection Time: 03/17/24  3:21 AM  Result Value Ref Range   Lipase 24 11 - 51 U/L  Troponin I (High Sensitivity)   Collection Time: 03/17/24  3:21 AM  Result Value Ref Range   Troponin I (High Sensitivity) 15 <18 ng/L  Brain natriuretic peptide   Collection Time: 03/17/24  6:21 AM  Result Value Ref Range   B Natriuretic Peptide 42.9 0.0 - 100.0 pg/mL  TSH   Collection Time: 03/17/24  6:21 AM  Result Value Ref Range   TSH 2.730 0.350 - 4.500 uIU/mL  CBC with Differential/Platelet   Collection Time: 03/17/24  6:21 AM  Result Value Ref Range   WBC 6.9 4.0 - 10.5 K/uL   RBC 4.61 3.87 - 5.11 MIL/uL   Hemoglobin 14.6 12.0 - 15.0 g/dL   HCT 27.2 53.6 - 64.4 %   MCV 96.5 80.0 - 100.0 fL   MCH 31.7 26.0 - 34.0 pg   MCHC 32.8 30.0 - 36.0 g/dL   RDW 03.4 74.2 - 59.5 %   Platelets 177 150 - 400 K/uL   nRBC 0.0 0.0 - 0.2 %   Neutrophils Relative % 89 %   Neutro Abs 6.1 1.7 - 7.7 K/uL   Lymphocytes Relative 5 %   Lymphs Abs 0.4 (L) 0.7 - 4.0 K/uL   Monocytes Relative 5 %   Monocytes Absolute 0.3 0.1 - 1.0 K/uL   Eosinophils Relative 1 %   Eosinophils Absolute 0.1 0.0 - 0.5 K/uL   Basophils Relative 0 %   Basophils Absolute 0.0 0.0 - 0.1 K/uL    Immature Granulocytes 0 %   Abs Immature Granulocytes 0.02 0.00 - 0.07 K/uL  Urinalysis, Routine w reflex microscopic -Urine, Clean Catch   Collection Time: 03/17/24 10:00 AM  Result Value Ref Range   Color, Urine YELLOW YELLOW   APPearance CLEAR CLEAR   Specific Gravity, Urine >1.046 (H) 1.005 - 1.030   pH 5.0 5.0 - 8.0   Glucose, UA NEGATIVE NEGATIVE mg/dL   Hgb urine dipstick NEGATIVE NEGATIVE   Bilirubin Urine NEGATIVE NEGATIVE   Ketones, ur 5 (A) NEGATIVE mg/dL   Protein, ur NEGATIVE NEGATIVE mg/dL   Nitrite NEGATIVE NEGATIVE   Leukocytes,Ua NEGATIVE NEGATIVE   CT ABDOMEN PELVIS W CONTRAST Result Date: 03/17/2024 CLINICAL DATA:  Abdominal pain. EXAM: CT ABDOMEN AND PELVIS WITH CONTRAST TECHNIQUE: Multidetector CT imaging of the abdomen and pelvis was performed using the standard protocol following bolus administration of intravenous contrast. RADIATION DOSE REDUCTION: This exam was performed according to the departmental dose-optimization program which includes automated exposure control, adjustment of the mA and/or kV according to patient size and/or use of iterative reconstruction technique. CONTRAST:  65mL OMNIPAQUE  IOHEXOL  350 MG/ML SOLN COMPARISON:  CT abdomen pelvis dated 03/18/2018. FINDINGS: Lower chest: The visualized lung bases are clear. No intra-abdominal free or free fluid. Hepatobiliary: Probable mild fatty liver. No biliary dilatation. The gallbladder is unremarkable. Pancreas: Unremarkable. No pancreatic ductal dilatation or surrounding inflammatory changes. Spleen: Normal in size without focal abnormality. Adrenals/Urinary Tract: The adrenal glands are unremarkable small right renal upper pole cyst. There is no hydronephrosis on either side. There is symmetric enhancement and excretion of contrast by both kidneys. The visualized ureters and urinary bladder appear unremarkable. Stomach/Bowel: There is sigmoid diverticulosis there is a small hiatal hernia. There is sigmoid  diverticulosis. There is no bowel obstruction or active inflammation. The appendix is normal. Vascular/Lymphatic: Mild aortoiliac atherosclerotic disease. The IVC is unremarkable. No portal venous gas. There is no adenopathy. Reproductive: The uterus is anteverted. No suspicious adnexal masses. Bilateral tubal ligation clips. Other: None Musculoskeletal: Degenerative changes of the spine and scoliosis. No acute osseous pathology. IMPRESSION: 1. No acute intra-abdominal or pelvic pathology. 2. Sigmoid diverticulosis. No bowel obstruction. Normal appendix. Electronically Signed   By: Angus Bark M.D.   On: 03/17/2024 10:26   DG Chest Portable 1 View Result Date: 03/17/2024 CLINICAL DATA:  Shortness of breath,  chest pain and tachycardia. Atrial fibrillation. EXAM: PORTABLE CHEST 1 VIEW COMPARISON:  04/12/2019 FINDINGS: Stable cardiomediastinal contours. No pleural fluid, interstitial edema or airspace disease. Mild scarring within the left lower lobe. Visualized osseous structures are notable for mild thoracic scoliosis. IMPRESSION: No acute cardiopulmonary abnormalities. Electronically Signed   By: Kimberley Penman M.D.   On: 03/17/2024 06:42       Debbra Fairy, PA-C 03/24/24 2316    Burnette Carte, MD 03/25/24 [phone]

## 2024-03-17 NOTE — ED Provider Notes (Signed)
 8:00 PM Patient awake, alert, smiling states that she is ready to go home.  I have already discussed the patient's case with her critical care colleagues who had previously planned to admit the patient.  She has been off of Levophed for hours, has continued to improve is hemodynamically stable comfortable discharge, voices understanding of return precautions.   Gerhard Munch, MD 03/17/24 2001

## 2024-03-17 NOTE — ED Notes (Signed)
 Sr Agarwala at bedside

## 2024-03-17 NOTE — ED Provider Notes (Signed)
 [place] EMERGENCY DEPARTMENT AT [facility] Provider Note   CSN: [id number] Arrival date & time: 03/17/24  4098     History  Chief Complaint  Patient presents with   Emesis   Abdominal Pain   Diarrhea    Priscilla Underwood is a 63 y.o. female with medical history significant for fibromyalgia, GERD, depression, arthritis.  The patient presents to the ED for evaluation of abdominal pain, nausea, vomiting and diarrhea.  The patient reports that yesterday she went to a church function where she consumed food.  States that no one else at this function developed any kind of GI related symptoms that she is aware of.  She reports that last night around 11 PM she developed abdominal pain associated with nausea, vomiting and diarrhea.  She reports that she has been up all night with nausea, vomiting and diarrhea.  She reports 7 episodes of nausea and vomiting, 6 episodes of diarrhea.  Denies blood in vomit or blood in stool.  Also endorsing epigastric abdominal pain that does not radiate.  She denies any chest pain, shortness of breath but she does endorse lightheadedness, weakness on standing.  She arrives tachycardic with a pulse rate bounding between 130 and 145.  She appears to be in atrial fibrillation on the monitor.  She denies a history of atrial fibrillation.  She denies history of anticoagulation.  Denies leg swelling.  Attending Dr. Eudelia Bunch called to the bedside.   Emesis Associated symptoms: abdominal pain and diarrhea   Abdominal Pain Associated symptoms: diarrhea, nausea and vomiting   Associated symptoms: no chest pain and no shortness of breath   Diarrhea Associated symptoms: abdominal pain and vomiting        Home Medications Prior to Admission medications   Medication Sig Start Date End Date Taking? Authorizing Provider  albuterol (VENTOLIN HFA) 108 (90 Base) MCG/ACT inhaler Inhale 2 puffs into the lungs every 6 hours as needed 03/21/21      amoxicillin-clavulanate (AUGMENTIN) 875-125 MG tablet Take 1 tablet by mouth every 12 (twelve) hours for 10 days. 12/19/22     Azelastine HCl (ASTEPRO) 0.15 % SOLN Use 2 sprays in each nostril once daily. 09/08/22     Azelastine HCl (ASTEPRO) 0.15 % SOLN Use 2 sprays in each nostril once a day. 12/19/22     azithromycin (ZITHROMAX) 250 MG tablet Take 2 tablets by mouth on the first day, then take 1 tablet daily for 4 days. 12/25/22     buPROPion (WELLBUTRIN XL) 150 MG 24 hr tablet TAKE 1 TABLET BY MOUTH IN THE MORNING 09/06/20 09/06/21  Jonathon Bellows, NP  buPROPion (WELLBUTRIN XL) 150 MG 24 hr tablet Take 1 tablet by mouth once daily in the morning 10/06/22     buPROPion (WELLBUTRIN XL) 150 MG 24 hr tablet Take 1 tablet by mouth once daily in the morning 12/29/22     buPROPion (WELLBUTRIN XL) 150 MG 24 hr tablet Take 1 tablet (150 mg) by mouth every morning. 06/01/23     desvenlafaxine (PRISTIQ) 100 MG 24 hr tablet desvenlafaxine ER 100 mg tablet,extended release 24 hr  Take 1 tablet every day by oral route.    [provider]  desvenlafaxine (PRISTIQ) 100 MG 24 hr tablet Take 1 tablet (100 mg total) by mouth daily. 06/19/22     desvenlafaxine (PRISTIQ) 50 MG 24 hr tablet Take 1 tablet (50 mg total) by mouth daily. 06/01/23     desvenlafaxine (PRISTIQ) 50 MG 24 hr tablet Take  1 tablet (50 mg total) by mouth daily. 12/29/22     diclofenac (VOLTAREN) 75 MG EC tablet Take 1 tablet (75 mg total) by mouth 2 (two) times daily as needed. 11/24/22     diclofenac (VOLTAREN) 75 MG EC tablet Take 1 tablet (75 mg) by mouth daily for 90 days. 06/09/23     doxycycline (ADOXA) 100 MG tablet Take 1 tablet twice daily for 10 days. 09/08/22     esomeprazole (NEXIUM) 40 MG capsule Take 40 mg by mouth daily at 12 noon.    [provider]  famotidine (PEPCID) 10 MG tablet Take 10 mg by mouth daily.    [provider]  gabapentin (NEURONTIN) 100 MG capsule Take 3 capsules (300 mg total) by mouth  daily as directed 06/09/23     methocarbamol (ROBAXIN) 500 MG tablet Take 500 mg by mouth every 8 (eight) hours as needed for muscle spasms.    [provider]  methocarbamol (ROBAXIN) 500 MG tablet Take 1 tablet (500 mg total) by mouth 3 (three) times daily for 30 days. 06/09/23     metoprolol tartrate (LOPRESSOR) 25 MG tablet TAKE 1 TABLET 2 HOURS PRIOR TO CT 11/28/19   Jodelle Red, MD  mupirocin ointment (BACTROBAN) 2 % Apply to affected area 2 times a day for 14 days 10/04/21     pantoprazole (PROTONIX) 40 MG tablet Take 1 tablet (40 mg total) by mouth 2 (two) times daily. 04/20/23     promethazine-dextromethorphan (PROMETHAZINE-DM) 6.25-15 MG/5ML syrup Take 5 mL by mouth every 6 hours as needed for 10 days 01/23/22     promethazine-dextromethorphan (PROMETHAZINE-DM) 6.25-15 MG/5ML syrup Take every 6 hours as needed for 5 days. 09/08/22     sertraline (ZOLOFT) 100 MG tablet Take 100 mg by mouth.  01/01/17   [provider]  sucralfate (CARAFATE) 1 g tablet Take 1 g by mouth 4 (four) times daily -  with meals and at bedtime.    [provider]  sulfamethoxazole-trimethoprim (BACTRIM DS) 800-160 MG tablet Take 1 tablet by mouth 2 times a day for 14 days 10/04/21     terbinafine (LAMISIL) 250 MG tablet Please take one a day x 7days, repeat every 4 weeks x 4 months 09/10/22   Lenn Sink, DPM  terbinafine (LAMISIL) 250 MG tablet Take 1 tablet (250 mg total) by mouth daily. 02/11/23     traMADol (ULTRAM) 50 MG tablet Take 1 tablet  by mouth once a day as needed (30 days) 03/04/22     valACYclovir (VALTREX) 1000 MG tablet as needed.  01/20/17   [provider]  valACYclovir (VALTREX) 500 MG tablet Take 1 tablet (500 mg) by mouth daily. 07/20/23     zolpidem (AMBIEN) 5 MG tablet Take 5 mg by mouth at bedtime as needed for sleep.  01/01/17   [provider]  zolpidem (AMBIEN) 5 MG tablet Take 1 tablet (5 mg total) by mouth at bedtime as needed (please  try NOT to take every evening) 12/25/22         Allergies    Ciprofloxacin and Avelox [moxifloxacin hcl in nacl]    Review of Systems   Review of Systems  Respiratory:  Negative for shortness of breath.   Cardiovascular:  Negative for chest pain.  Gastrointestinal:  Positive for abdominal pain, diarrhea, nausea and vomiting.  Neurological:  Positive for weakness and light-headedness.  All other systems reviewed and are negative.   Physical Exam Updated Vital Signs BP Marland Kitchen)  130/113 (BP Location: Right Arm)   Pulse (!) 153   Temp 97.8 F (36.6 C) (Oral)   Resp 15   SpO2 98%  Physical Exam Vitals and nursing note reviewed.  Constitutional:      General: She is not in acute distress.    Appearance: She is well-developed.  HENT:     Head: Normocephalic and atraumatic.  Eyes:     Conjunctiva/sclera: Conjunctivae normal.  Cardiovascular:     Rate and Rhythm: Tachycardia present. Rhythm irregular.     Heart sounds: No murmur heard. Pulmonary:     Effort: Pulmonary effort is normal. No respiratory distress.     Breath sounds: Normal breath sounds.  Abdominal:     Palpations: Abdomen is soft.     Tenderness: There is abdominal tenderness.     Comments: Epigastric TTP.  No rebound or guarding.  Musculoskeletal:        General: No swelling.     Cervical back: Neck supple.  Skin:    General: Skin is warm and dry.     Capillary Refill: Capillary refill takes less than 2 seconds.  Neurological:     Mental Status: She is alert and oriented to person, place, and time. Mental status is at baseline.     Comments: 5 out of 5 strength bilateral upper extremities.  5 out of 5 strength bilateral lower extremities.  Patient follows commands.  Alert and oriented.  Psychiatric:        Mood and Affect: Mood normal.     ED Results / Procedures / Treatments   Labs (all labs ordered are listed, but only abnormal results are displayed) Labs Reviewed  CBC WITH DIFFERENTIAL/PLATELET   COMPREHENSIVE METABOLIC PANEL WITH GFR  LIPASE, BLOOD  URINALYSIS, ROUTINE W REFLEX MICROSCOPIC  BRAIN NATRIURETIC PEPTIDE  TSH  CBC WITH DIFFERENTIAL/PLATELET  TROPONIN I (HIGH SENSITIVITY)    EKG None  Radiology No results found.  Procedures .Critical Care  Performed by: Al Decant, PA-C Authorized by: Al Decant, PA-C   Critical care provider statement:    Critical care time (minutes):  50   Critical care time was exclusive of:  Separately billable procedures and treating other patients   Critical care was necessary to treat or prevent imminent or life-threatening deterioration of the following conditions:  Cardiac failure   Critical care was time spent personally by me on the following activities:  Blood draw for specimens, development of treatment plan with patient or surrogate, discussions with consultants, discussions with primary provider, evaluation of patient's response to treatment, examination of patient, interpretation of cardiac output measurements, obtaining history from patient or surrogate, ordering and performing treatments and interventions, ordering and review of laboratory studies, ordering and review of radiographic studies, pulse oximetry, re-evaluation of patient's condition and review of old charts   I assumed direction of critical care for this patient from another provider in my specialty: no      Medications Ordered in ED Medications  sodium chloride 0.9 % bolus 1,000 mL (1,000 mLs Intravenous New Bag/Given 03/17/24 0606)  ondansetron (ZOFRAN) injection 4 mg (4 mg Intravenous Given 03/17/24 7829)    ED Course/ Medical Decision Making/ A&P   { Medical Decision Making Amount and/or Complexity of Data Reviewed Labs: ordered. Radiology: ordered.  Risk Prescription drug management.   63 year old female presents for evaluation.  Please see HPI for further details.  On examination patient is afebrile, tachycardic and irregular  rhythm.  Appears to be in A-fib.  Her lung sounds are clear bilaterally, she is nonhypoxic.  Abdomen has tenderness in the epigastric region without rebound or guarding.  Neurological examinations at baseline, patient follows commands appropriately, alert and oriented x 4.  Patient appears to be in A-fib RVR.  Patient has no history of A-fib RVR, does not take anticoagulation.  Will provide patient 1 L of fluid for rate control, Zofran for nausea and vomiting.  Will collect CBC, CMP, lipase, CT abdomen pelvis with contrast, troponins, BNP, EKG, TSH.  Will also collect chest x-ray.   At end of shift, patient work up not complete. Signed out to Fayrene Helper PA-C pending results of labwork and imaging. Plan of management discussed. Anticipate patient will be admitted to hospital.   Final Clinical Impression(s) / ED Diagnoses Final diagnoses:  Nausea vomiting and diarrhea  Atrial fibrillation with RVR (HCC)  Epigastric pain    Rx / DC Orders ED Discharge Orders     None         Clent Ridges 03/17/24 1610    Nira Conn, MD 03/17/24 0800

## 2024-03-17 NOTE — ED Notes (Signed)
 Pt ambulatory to and from restroom with steady gait

## 2024-03-17 NOTE — Consult Note (Signed)
   NAME:  Priscilla Underwood, MRN:  [id number], DOB:  May 26, 1961, LOS: 0 ADMISSION DATE:  03/17/2024, CONSULTATION DATE: 03/17/2024 REFERRING MD:  [facility] -ED [facility], CHIEF COMPLAINT: Hypotension.  History of Present Illness:  63 year old woman who presents with unremitting nausea, vomiting and diarrhea since last night after eating at a church dinner.  She received IV fluids but was persistently hypotensive and started on vasopressors.  There was an episode of atrial fibrillation while hypotensive which resolved spontaneously.  She is otherwise previously healthy with no significant past medical history.  Pertinent  Medical History   Past Medical History:  Diagnosis Date   Arthritis    osteo arthritis   Depression    Fatty liver    Fibromyalgia    GERD (gastroesophageal reflux disease)    PONV (postoperative nausea and vomiting)      Significant Hospital Events: Including procedures, antibiotic start and stop dates in addition to other pertinent events   4/10 -presented to ED.  Interim History / Subjective:  By the time that I saw her, she had stopped having diarrhea and vomiting.  She was able to hold liquids down.  She had urinated once.  Objective   Blood pressure 116/81, pulse 88, temperature 99.3 F (37.4 C), resp. rate 20, SpO2 98%.        Intake/Output Summary (Last 24 hours) at 03/17/2024 1753 Last data filed at 03/17/2024 0943 Gross per 24 hour  Intake 1500 ml  Output --  Net 1500 ml   There were no vitals filed for this visit.  Examination: General: Appears comfortable.  Color normal. HENT: Membranes are moist Lungs: Chest clear. Cardiovascular: Sounds are unremarkable.  She is in sinus rhythm. Abdomen: Abdomen is soft. Extremities: No edema. Neuro: No focal deficits. GU: Deferred.  Ancillary test personally reviewed:  Mild hyperglycemia on otherwise unremarkable lab work.  Assessment & Plan:  Self-limited gastrointestinal illness likely related to  foodborne illness. Has now been off vasopressors for nearly 4 hours. She has been able to drink nearly a liter of Pedialyte without nausea. Self terminating atrial fibrillation likely driven by high adrenergic tone.  -Will stop IV fluids and ambulate.  I think that she can be discharged home but I will defer to the emergency physician.  She does not need to come to the ICU if she does need to be admitted for observation. -She is presently back in sinus rhythm.  Have advised that the patient follow-up with her primary care physician.  Will be reasonable to obtain an echocardiogram and base a 1 week event monitor.  CHA2DS2-VASc score is low at this time.  Lynnell Catalan, MD [facility] ICU Physician [facility]  Pager: [phone] Or Epic Secure Chat After hours: [phone].  03/17/2024, 5:59 PM

## 2024-03-17 NOTE — ED Notes (Signed)
 Levo stopped per Dr. Denese Killings

## 2024-03-17 NOTE — ED Triage Notes (Signed)
 Pt BIB [facility] from home. EMS called out for n/v/d and epigastric pain that started around 11pm last night. Pt states she has had 5 episodes of diarrhea and multiple episodes of emesis. Pt reports she did eat a group meal at church last night. EMS reports 110-150P. No cardiac hx. Pt endorses feeling dizzy upon ambulation and abdominal pain, Denies chest pain. GCS 15  EMS 100/80BP 96% RA 191CBG 18R 700 LR 4mg  zofran 20 LAC

## 2024-03-21 DIAGNOSIS — I4891 Unspecified atrial fibrillation: Secondary | ICD-10-CM | POA: Diagnosis not present

## 2024-03-22 LAB — CULTURE, BLOOD (ROUTINE X 2)
Culture: NO GROWTH
Culture: NO GROWTH

## 2024-03-30 ENCOUNTER — Ambulatory Visit (HOSPITAL_BASED_OUTPATIENT_CLINIC_OR_DEPARTMENT_OTHER): Admitting: Orthopaedic Surgery

## 2024-04-07 DIAGNOSIS — M25511 Pain in right shoulder: Secondary | ICD-10-CM | POA: Diagnosis not present

## 2024-04-19 DIAGNOSIS — J0101 Acute recurrent maxillary sinusitis: Secondary | ICD-10-CM | POA: Diagnosis not present

## 2024-05-09 ENCOUNTER — Encounter (HOSPITAL_BASED_OUTPATIENT_CLINIC_OR_DEPARTMENT_OTHER): Payer: Self-pay | Admitting: Orthopaedic Surgery

## 2024-05-09 ENCOUNTER — Encounter (HOSPITAL_BASED_OUTPATIENT_CLINIC_OR_DEPARTMENT_OTHER): Payer: Self-pay | Admitting: Physical Therapy

## 2024-05-09 ENCOUNTER — Other Ambulatory Visit (HOSPITAL_BASED_OUTPATIENT_CLINIC_OR_DEPARTMENT_OTHER): Payer: Self-pay | Admitting: Orthopaedic Surgery

## 2024-05-09 DIAGNOSIS — G8929 Other chronic pain: Secondary | ICD-10-CM

## 2024-05-09 DIAGNOSIS — M25551 Pain in right hip: Secondary | ICD-10-CM

## 2024-05-10 ENCOUNTER — Other Ambulatory Visit: Payer: Self-pay

## 2024-05-10 ENCOUNTER — Ambulatory Visit: Admitting: Sports Medicine

## 2024-05-10 ENCOUNTER — Encounter: Payer: Self-pay | Admitting: Sports Medicine

## 2024-05-10 DIAGNOSIS — G8929 Other chronic pain: Secondary | ICD-10-CM

## 2024-05-10 DIAGNOSIS — M7918 Myalgia, other site: Secondary | ICD-10-CM | POA: Diagnosis not present

## 2024-05-10 DIAGNOSIS — M533 Sacrococcygeal disorders, not elsewhere classified: Secondary | ICD-10-CM | POA: Diagnosis not present

## 2024-05-10 NOTE — Progress Notes (Signed)
 Priscilla Underwood - 63 y.o. female MRN [id number]  Date of birth: 29-Jun-1961  Office Visit Note: Visit Date: 05/10/2024 PCP: Priscilla Mount, DO (Inactive) Referred by: Priscilla Harada, MD  Subjective: Chief Complaint  Patient presents with   Lower Back - Pain    Right SI joint Injection   HPI: Priscilla Underwood is a pleasant 63 y.o. female who presents today for acute on chronic right sided low back/buttock and posterior hip pain.  We have performed 2 previous SI joint injections which gave her really good relief with the first and moderate leaf with the second.  She also has seen Priscilla Underwood and had both an intra-articular hip injection and a greater trochanteric injection which were not as helpful.  She also has done dry needling and physical therapy with Priscilla Underwood and is interested in repeating this as well.  She does take oral diclofenac  75 mg once daily for general joint pain which she takes chronically.  Pertinent ROS were reviewed with the patient and found to be negative unless otherwise specified above in HPI.   Assessment & Plan: Visit Diagnoses:  1. Chronic right SI joint pain   2. Right buttock pain    Plan: Impression is acute on chronic right posterior SI joint and hip/buttock pain -she has had a positive response in the past to an SI joint injection and her exam does suggest this is her main pathology today.  Through shared decision-making, we did repeat ultrasound-guided right SI joint injection, patient tolerated well.  Advised on postinjection protocol.  I would like her to really pay attention over the next few days up to 2 weeks to see the degree of relief she receives (0-100%) and the exact location of her pain improvement for both diagnostic and therapeutic purposes.  She will get started back in formalized physical therapy with Priscilla Underwood as well as some dry needling over the buttock and the piriformis region.  She will follow-up with me/Bokshan as  needed.  Follow-up: Return if symptoms worsen or fail to improve.   Meds & Orders: No orders of the defined types were placed in this encounter.   Orders Placed This Encounter  Procedures   US  Guided Needle Placement - No Linked Charges     Procedures: U/S-guided SI-joint injection, Right   After discussion of risk/benefits/indications, informed verbal consent was obtained. A timeout was then performed. The patient was positioned in a prone position on exam room table with a pillow placed under the pelvis for mild hip flexion. The SI joint area was cleaned and prepped with betadine and alcohol swabs. Sterile ultrasound gel was applied and the ultrasound transducer was placed in an anatomic axial plane over the PSIS, then moved distally over the SI-joint. Using ultrasound guidance, a 22-gauge, 3.5" needle was inserted from a medial to lateral approach utilizing an in-plane approach and directed into the SI-joint. The SI-joint was then injected with a mixture of 4:2 lidocaine :depomedrol with visualization of the injectate flow into the SI-joint under ultrasound visualization. The patient tolerated the procedure well without immediate complications.       Clinical History: No specialty comments available.  She reports that she has quit smoking. Her smoking use included cigarettes. She has never used smokeless tobacco. No results for input(s): "HGBA1C", "LABURIC" in the last 8760 hours.  Objective:    Physical Exam  Gen: Well-appearing, in no acute distress; non-toxic CV: Well-perfused. Warm.  Resp: Breathing unlabored on room air; no wheezing. Psych:  Fluid speech in conversation; appropriate affect; normal thought process  Ortho Exam - SI/Hip: The right hip moves fluidly with internal and external logroll.  Positive Fortin's point test and pain around the right SI joint with compression testing.   Imaging:  Outside report of Right hip MRI: - Small undersurface tear of the gluteus  medius minimus junction  Past Medical/Family/Surgical/Social History: Medications & Allergies reviewed per EMR, new medications updated. Patient Active Problem List   Diagnosis Date Noted   Anxiety 06/24/2021   Depressive disorder 06/24/2021   Gastroesophageal reflux disease    Globus sensation    Belching    Lipoma of scalp 05/28/2018   Past Medical History:  Diagnosis Date   Arthritis    osteo arthritis   Depression    Fatty liver    Fibromyalgia    GERD (gastroesophageal reflux disease)    PONV (postoperative nausea and vomiting)    Family History  Problem Relation Age of Onset   Breast cancer Sister 42   Breast cancer Paternal Aunt 24   Lung cancer Mother    Heart disease Father    Heart failure Maternal Grandmother    Lung cancer Maternal Grandfather    Pancreatic cancer Paternal Grandmother    Colon cancer Neg Hx    Esophageal cancer Neg Hx    Rectal cancer Neg Hx    Stomach cancer Neg Hx    Past Surgical History:  Procedure Laterality Date   67 HOUR PH STUDY N/A 12/14/2019   Procedure: 24 HOUR PH STUDY;  Surgeon: Sergio Dandy, MD;  Location: [facility];  Service: Endoscopy;  Laterality: N/A;   BUNIONECTOMY  2004   CARPAL TUNNEL RELEASE Left 06/15/2015   Procedure: LEFT CARPAL TUNNEL RELEASE;  Surgeon: Ronn Cohn, MD;  Location: [facility];  Service: Orthopedics;  Laterality: Left;   CARPAL TUNNEL RELEASE Right 08/31/2015   Procedure: RIGHT LIMITED OPEN CARPAL TUNNEL RELEASE;  Surgeon: Ronn Cohn, MD;  Location: [facility];  Service: Orthopedics;  Laterality: Right;   ESOPHAGEAL MANOMETRY N/A 12/14/2019   Procedure: ESOPHAGEAL MANOMETRY (EM);  Surgeon: Sergio Dandy, MD;  Location: [facility];  Service: Endoscopy;  Laterality: N/A;  DR. Savannah Curlin   LIPOMA EXCISION Right 05/28/2018   Procedure: EXCISION LIPOMA OF SCALP;  Surgeon: Ammon Bales, MD;  Location: [facility];  Service: ENT;  Laterality:  Right;   SHOULDER SURGERY  2010, 2008   TARSAL TUNNEL RELEASE Left 2012   TUBAL LIGATION  1985   Social History   Occupational History   Not on file  Tobacco Use   Smoking status: Former    Types: Cigarettes   Smokeless tobacco: Never   Tobacco comments:    Quit 35 years ago  Vaping Use   Vaping status: Never Used  Substance and Sexual Activity   Alcohol use: No   Drug use: No   Sexual activity: Yes    Birth control/protection: Post-menopausal

## 2024-05-11 ENCOUNTER — Other Ambulatory Visit (HOSPITAL_BASED_OUTPATIENT_CLINIC_OR_DEPARTMENT_OTHER): Payer: Self-pay | Admitting: Orthopaedic Surgery

## 2024-05-11 DIAGNOSIS — G8929 Other chronic pain: Secondary | ICD-10-CM

## 2024-06-08 ENCOUNTER — Encounter (HOSPITAL_BASED_OUTPATIENT_CLINIC_OR_DEPARTMENT_OTHER): Payer: Self-pay | Admitting: Physical Therapy

## 2024-06-17 DIAGNOSIS — F330 Major depressive disorder, recurrent, mild: Secondary | ICD-10-CM | POA: Diagnosis not present

## 2024-06-17 DIAGNOSIS — F4323 Adjustment disorder with mixed anxiety and depressed mood: Secondary | ICD-10-CM | POA: Diagnosis not present

## 2024-06-20 ENCOUNTER — Ambulatory Visit: Admitting: Rehabilitative and Restorative Service Providers"

## 2024-06-20 DIAGNOSIS — M778 Other enthesopathies, not elsewhere classified: Secondary | ICD-10-CM | POA: Diagnosis not present

## 2024-06-20 DIAGNOSIS — M19072 Primary osteoarthritis, left ankle and foot: Secondary | ICD-10-CM | POA: Diagnosis not present

## 2024-06-20 DIAGNOSIS — M79672 Pain in left foot: Secondary | ICD-10-CM | POA: Diagnosis not present

## 2024-06-21 DIAGNOSIS — Z79899 Other long term (current) drug therapy: Secondary | ICD-10-CM | POA: Diagnosis not present

## 2024-06-21 DIAGNOSIS — M1991 Primary osteoarthritis, unspecified site: Secondary | ICD-10-CM | POA: Diagnosis not present

## 2024-06-21 DIAGNOSIS — M797 Fibromyalgia: Secondary | ICD-10-CM | POA: Diagnosis not present

## 2024-06-21 DIAGNOSIS — M79642 Pain in left hand: Secondary | ICD-10-CM | POA: Diagnosis not present

## 2024-06-27 DIAGNOSIS — Z6827 Body mass index (BMI) 27.0-27.9, adult: Secondary | ICD-10-CM | POA: Diagnosis not present

## 2024-06-27 DIAGNOSIS — F329 Major depressive disorder, single episode, unspecified: Secondary | ICD-10-CM | POA: Diagnosis not present

## 2024-06-27 DIAGNOSIS — Z0000 Encounter for general adult medical examination without abnormal findings: Secondary | ICD-10-CM | POA: Diagnosis not present

## 2024-06-27 DIAGNOSIS — K219 Gastro-esophageal reflux disease without esophagitis: Secondary | ICD-10-CM | POA: Diagnosis not present

## 2024-06-27 DIAGNOSIS — Z13220 Encounter for screening for lipoid disorders: Secondary | ICD-10-CM | POA: Diagnosis not present

## 2024-07-11 ENCOUNTER — Encounter (HOSPITAL_BASED_OUTPATIENT_CLINIC_OR_DEPARTMENT_OTHER): Payer: Self-pay | Admitting: Physical Therapy

## 2024-07-11 ENCOUNTER — Ambulatory Visit (HOSPITAL_BASED_OUTPATIENT_CLINIC_OR_DEPARTMENT_OTHER): Attending: Orthopaedic Surgery | Admitting: Physical Therapy

## 2024-07-11 DIAGNOSIS — G8929 Other chronic pain: Secondary | ICD-10-CM | POA: Diagnosis not present

## 2024-07-11 DIAGNOSIS — M533 Sacrococcygeal disorders, not elsewhere classified: Secondary | ICD-10-CM | POA: Diagnosis not present

## 2024-07-11 DIAGNOSIS — R293 Abnormal posture: Secondary | ICD-10-CM | POA: Insufficient documentation

## 2024-07-11 DIAGNOSIS — M545 Low back pain, unspecified: Secondary | ICD-10-CM | POA: Insufficient documentation

## 2024-07-11 DIAGNOSIS — M5459 Other low back pain: Secondary | ICD-10-CM

## 2024-07-11 NOTE — Therapy (Signed)
 OUTPATIENT PHYSICAL THERAPY EVALUATION   Patient Name: Priscilla Underwood MRN: [id number] DOB:02-07-1961, 63 y.o., female Today's Date: 07/12/2024  END OF SESSION:  PT End of Session - 07/11/24 1019     Visit Number 1    Number of Visits 12    Date for PT Re-Evaluation 08/27/24    Authorization Type BCBS    PT Start Time 1018    PT Stop Time 1100    PT Time Calculation (min) 42 min    Activity Tolerance Patient tolerated treatment well    Behavior During Therapy WFL for tasks assessed/performed          Past Medical History:  Diagnosis Date   Arthritis    osteo arthritis   Carpal tunnel syndrome    Depression    Fatty liver    Fibromyalgia    Fibromyalgia    GERD (gastroesophageal reflux disease)    Low back pain    Osteoarthritis    Osteoarthritis    Pain in joint    Polyarthralgia    PONV (postoperative nausea and vomiting)    Past Surgical History:  Procedure Laterality Date   1 HOUR PH STUDY N/A 12/14/2019   Procedure: 24 HOUR PH STUDY;  Surgeon: Shila Gustav GAILS, MD;  Location: [facility];  Service: Endoscopy;  Laterality: N/A;   BUNIONECTOMY  2004   CARPAL TUNNEL RELEASE Left 06/15/2015   Procedure: LEFT CARPAL TUNNEL RELEASE;  Surgeon: Elsie Mussel, MD;  Location: [facility];  Service: Orthopedics;  Laterality: Left;   CARPAL TUNNEL RELEASE Right 08/31/2015   Procedure: RIGHT LIMITED OPEN CARPAL TUNNEL RELEASE;  Surgeon: Elsie Mussel, MD;  Location: [facility];  Service: Orthopedics;  Laterality: Right;   ESOPHAGEAL MANOMETRY N/A 12/14/2019   Procedure: ESOPHAGEAL MANOMETRY (EM);  Surgeon: Shila Gustav GAILS, MD;  Location: [facility];  Service: Endoscopy;  Laterality: N/A;  DR. EDA   LIPOMA EXCISION Right 05/28/2018   Procedure: EXCISION LIPOMA OF SCALP;  Surgeon: Mable Lenis, MD;  Location: [facility];  Service: ENT;  Laterality: Right;   SHOULDER SURGERY  2010, 2008   TARSAL TUNNEL RELEASE Left  2012   TUBAL LIGATION  1985   Patient Active Problem List   Diagnosis Date Noted   Anxiety 06/24/2021   Depressive disorder 06/24/2021   Gastroesophageal reflux disease    Globus sensation    Belching    Lipoma of scalp 05/28/2018     REFERRING PROVIDER:  Genelle Standing, MD     REFERRING DIAG:  M53.3,G89.29 (ICD-10-CM) - Chronic right SI joint pain      Rationale for Evaluation and Treatment: Rehabilitation  THERAPY DIAG:  Other low back pain  Abnormal posture  ONSET DATE: in the last 2 weeks   SUBJECTIVE:  SUBJECTIVE STATEMENT: In the last 2 weeks I have felt a twinge in the Lt SIJ even though it's always been on the Right. Rt butt cheek is always sore. I do my stretches and go to the gym. Stretching Rt HS hurts the SIJ.   PERTINENT HISTORY:  Chronic buttock pain  PAIN:  Are you having pain? Yes: NPRS scale: 2-3 seated short periods Pain location: SIJ Pain description: twinge, sore Aggravating factors: driving Relieving factors: stand, stretch  PRECAUTIONS:  None  RED FLAGS: None   WEIGHT BEARING RESTRICTIONS:  No  FALLS:  Has patient fallen in last 6 months? No  OCCUPATION:  Retired but goes to [employer] often to [employer] granddaughter  PLOF:  Independent  PATIENT GOALS:  Decrease pain  OBJECTIVE:  Note: Objective measures were completed at Evaluation unless otherwise noted.  DIAGNOSTIC FINDINGS:  Past MRI in March 2025 Dr Genelle: small gluteus medius undersurface tear on Rt side  PATIENT SURVEYS:  LEFS  Extreme difficulty/unable (0), Quite a bit of difficulty (1), Moderate difficulty (2), Little difficulty (3), No difficulty (4) Survey date:    Any of your usual work, housework or school activities 2  2. Usual hobbies, recreational or sporting  activities 3  3. Getting into/out of the bath 4  4. Walking between rooms 4  5. Putting on socks/shoes 3  6. Squatting  2  7. Lifting an object, like a bag of groceries from the floor 2  8. Performing light activities around your home 3  9. Performing heavy activities around your home 2  10. Getting into/out of a car 2  11. Walking 2 blocks 4  12. Walking 1 mile 2  13. Going up/down 10 stairs (1 flight) 2  14. Standing for 1 hour 2  15.  sitting for 1 hour 0  16. Running on even ground 0  17. Running on uneven ground 0  18. Making sharp turns while running fast 0  19. Hopping  0  20. Rolling over in bed 4  Score total:  41/80     POSTURE:   Eval: standing Rt SIJ elevation, Rt ASIS elevated vs Lt  Supine: LLD- Rt shorter  GAIT:  Comments: EVAL- Rt trendelenburg   Body Part #1 Hip  PALPATION: EVAL: Limited Rt SIJ mobility                                                                                                                              TREATMENT DATE:   07/11/24 EVAL:  Current workout 4-5/week: elliiptical or bike <3 miles with constant buttock pain, does stretchig and some resistance PA spring mobs Rt SIJ STM/roll to Rt piriformis & HS Eccentric resisted prone HS curl Supine ab set with ball bw knees- close rib cage without activating HS   PATIENT EDUCATION:  Education details: Anatomy of condition, POC, HEP, exercise form/rationale Person educated: Patient Education method: Explanation, Demonstration, Tactile cues, Verbal cues, and Handouts Education  comprehension: verbalized understanding, returned demonstration, verbal cues required, tactile cues required, and needs further education  HOME EXERCISE PROGRAM: Ab set with iso add   ASSESSMENT:  CLINICAL IMPRESSION: Patient is a 63 y.o. F who was seen today for physical therapy evaluation and treatment for subacute on chronic SIJD and pain. Known presence of glut med tear and tightness through  buttock with returning concordant pain. Will message Dr Genelle about f/u with the patient. Will continue to train lumbopelvic stability to decrease spasm and overuse of piriformis and reach goals.       REHAB POTENTIAL: Good  CLINICAL DECISION MAKING: Stable/uncomplicated  EVALUATION COMPLEXITY: Low   GOALS: Goals reviewed with patient? Yes  SHORT TERM GOALS: Target date: 07/30/24  Independent with HEP as it has been established in the short term for piriformis stretching without increasing SIJ pain Baseline: Goal status: INITIAL   LONG TERM GOALS: Target date: POC date  LEFS to improve by MDC Baseline:  Goal status: INITIAL  2.  Squat/lift grandchild without increase in pain Baseline:  Goal status: INITIAL  3.  Tolerate driving in the car for at least 30 min without increase in pain Baseline:  Goal status: INITIAL     PLAN:  PT FREQUENCY: 1-2x/week  PT DURATION: POC date  PLANNED INTERVENTIONS: 97164- PT Re-evaluation, 97750- Physical Performance Testing, 97110-Therapeutic exercises, 97530- Therapeutic activity, [id number]- Neuromuscular re-education, 97535- Self Care, 02859- Manual therapy, [id number]- Gait training, [phone]- Aquatic Therapy, [phone] (1-2 muscles), 20561 (3+ muscles)- Dry Needling, Patient/Family education, Balance training, Stair training, Taping, Joint mobilization, Spinal mobilization, and Cryotherapy.  PLAN FOR NEXT SESSION: recheck alignment    Harlene Cordon, PT, DPT 07/12/2024, 4:09 PM

## 2024-07-12 ENCOUNTER — Other Ambulatory Visit: Payer: Self-pay

## 2024-07-12 ENCOUNTER — Encounter (HOSPITAL_BASED_OUTPATIENT_CLINIC_OR_DEPARTMENT_OTHER): Payer: Self-pay | Admitting: Physical Therapy

## 2024-07-14 ENCOUNTER — Ambulatory Visit (HOSPITAL_BASED_OUTPATIENT_CLINIC_OR_DEPARTMENT_OTHER): Admitting: Physical Therapy

## 2024-07-14 ENCOUNTER — Encounter (HOSPITAL_BASED_OUTPATIENT_CLINIC_OR_DEPARTMENT_OTHER): Payer: Self-pay | Admitting: Physical Therapy

## 2024-07-14 DIAGNOSIS — G8929 Other chronic pain: Secondary | ICD-10-CM | POA: Diagnosis not present

## 2024-07-14 DIAGNOSIS — R293 Abnormal posture: Secondary | ICD-10-CM

## 2024-07-14 DIAGNOSIS — M545 Low back pain, unspecified: Secondary | ICD-10-CM | POA: Diagnosis not present

## 2024-07-14 DIAGNOSIS — M533 Sacrococcygeal disorders, not elsewhere classified: Secondary | ICD-10-CM | POA: Diagnosis not present

## 2024-07-14 DIAGNOSIS — M5459 Other low back pain: Secondary | ICD-10-CM

## 2024-07-14 NOTE — Therapy (Signed)
 OUTPATIENT PHYSICAL THERAPY EVALUATION   Patient Name: Priscilla Underwood MRN: [id number] DOB:[birth year], 63 y.o., female Today's Date: 07/14/2024  END OF SESSION:  PT End of Session - 07/14/24 1016     Visit Number 2    Number of Visits 12    Date for PT Re-Evaluation 08/27/24    Authorization Type BCBS    PT Start Time 1015    PT Stop Time 1059    PT Time Calculation (min) 44 min    Activity Tolerance Patient tolerated treatment well    Behavior During Therapy WFL for tasks assessed/performed          Past Medical History:  Diagnosis Date   Arthritis    osteo arthritis   Carpal tunnel syndrome    Depression    Fatty liver    Fibromyalgia    Fibromyalgia    GERD (gastroesophageal reflux disease)    Low back pain    Osteoarthritis    Osteoarthritis    Pain in joint    Polyarthralgia    PONV (postoperative nausea and vomiting)    Past Surgical History:  Procedure Laterality Date   17 HOUR PH STUDY N/A 12/14/2019   Procedure: 24 HOUR PH STUDY;  Surgeon: Shila Gustav GAILS, MD;  Location: [facility];  Service: Endoscopy;  Laterality: N/A;   BUNIONECTOMY  2004   CARPAL TUNNEL RELEASE Left 06/15/2015   Procedure: LEFT CARPAL TUNNEL RELEASE;  Surgeon: Elsie Mussel, MD;  Location: [facility];  Service: Orthopedics;  Laterality: Left;   CARPAL TUNNEL RELEASE Right 08/31/2015   Procedure: RIGHT LIMITED OPEN CARPAL TUNNEL RELEASE;  Surgeon: Elsie Mussel, MD;  Location: [facility];  Service: Orthopedics;  Laterality: Right;   ESOPHAGEAL MANOMETRY N/A 12/14/2019   Procedure: ESOPHAGEAL MANOMETRY (EM);  Surgeon: Shila Gustav GAILS, MD;  Location: [facility];  Service: Endoscopy;  Laterality: N/A;  DR. EDA   LIPOMA EXCISION Right 05/28/2018   Procedure: EXCISION LIPOMA OF SCALP;  Surgeon: Mable Lenis, MD;  Location: [facility];  Service: ENT;  Laterality: Right;   SHOULDER SURGERY  2010, 2008   TARSAL TUNNEL RELEASE Left  2012   TUBAL LIGATION  1985   Patient Active Problem List   Diagnosis Date Noted   Anxiety 06/24/2021   Depressive disorder 06/24/2021   Gastroesophageal reflux disease    Globus sensation    Belching    Lipoma of scalp 05/28/2018     REFERRING PROVIDER:  Genelle Standing, MD     REFERRING DIAG:  M53.3,G89.29 (ICD-10-CM) - Chronic right SI joint pain      Rationale for Evaluation and Treatment: Rehabilitation  THERAPY DIAG:  Other low back pain  Abnormal posture  ONSET DATE: in the last 2 weeks   SUBJECTIVE:  SUBJECTIVE STATEMENT: Driving was terrible as usual. Felt good after treatment though.   PERTINENT HISTORY:  Chronic buttock pain  PAIN:  Are you having pain? Yes: NPRS scale: 2-3 seated short periods Pain location: SIJ Pain description: twinge, sore Aggravating factors: driving Relieving factors: stand, stretch  PRECAUTIONS:  None  RED FLAGS: None   WEIGHT BEARING RESTRICTIONS:  No  FALLS:  Has patient fallen in last 6 months? No  OCCUPATION:  Retired but goes to [employer] often to [employer] granddaughter  PLOF:  Independent  PATIENT GOALS:  Decrease pain  OBJECTIVE:  Note: Objective measures were completed at Evaluation unless otherwise noted.  DIAGNOSTIC FINDINGS:  Past MRI in March 2025 Dr Genelle: small gluteus medius undersurface tear on Rt side  PATIENT SURVEYS:  LEFS  Extreme difficulty/unable (0), Quite a bit of difficulty (1), Moderate difficulty (2), Little difficulty (3), No difficulty (4) Survey date:    Any of your usual work, housework or school activities 2  2. Usual hobbies, recreational or sporting activities 3  3. Getting into/out of the bath 4  4. Walking between rooms 4  5. Putting on socks/shoes 3  6. Squatting  2  7. Lifting an  object, like a bag of groceries from the floor 2  8. Performing light activities around your home 3  9. Performing heavy activities around your home 2  10. Getting into/out of a car 2  11. Walking 2 blocks 4  12. Walking 1 mile 2  13. Going up/down 10 stairs (1 flight) 2  14. Standing for 1 hour 2  15.  sitting for 1 hour 0  16. Running on even ground 0  17. Running on uneven ground 0  18. Making sharp turns while running fast 0  19. Hopping  0  20. Rolling over in bed 4  Score total:  41/80     POSTURE:   Eval: standing Rt SIJ elevation, Rt ASIS elevated vs Lt, Lt knee extending past 0 with distal femur ER  Supine: LLD- Rt shorter  GAIT:  Comments: EVAL- Rt trendelenburg   Body Part #1 Hip  PALPATION: EVAL: Limited Rt SIJ mobility                                                                                                                              TREATMENT DATE:   Treatment                            8/7: Blank lines following charge title = not provided on this treatment date.   Manual:  TPDN YES Trigger Point Dry Needling  Subsequent Treatment: Instructions provided previously at initial dry needling treatment.  From previous POC  Patient Verbal Consent Given: Yes Education Handout Provided: Previously Provided Muscles Treated: Lt TFL, glut max/med, piriformis, biceps femoris  Electrical Stimulation Performed: No Treatment Response/Outcome: twitch with decreased spasm STM to all musculature  following TPDN LAD RLE through abd 25-45 deg There-ex: Standing overhead reach of right arm with deep breathing Hamstring stretching There-Act: Single layer heel lift to left shoe- walking with and without Self Care:  Nuro-Re-ed:  Gait Training:    07/11/24 EVAL:  Current workout 4-5/week: elliiptical or bike <3 miles with constant buttock pain, does stretchig and some resistance PA spring mobs Rt SIJ STM/roll to Rt piriformis & HS Eccentric resisted  prone HS curl Supine ab set with ball bw knees- close rib cage without activating HS   PATIENT EDUCATION:  Education details: Teacher, music of condition, POC, HEP, exercise form/rationale Person educated: Patient Education method: Explanation, Demonstration, Tactile cues, Verbal cues, and Handouts Education comprehension: verbalized understanding, returned demonstration, verbal cues required, tactile cues required, and needs further education  HOME EXERCISE PROGRAM: Ab set with iso add Rt overhead reach with breathing   ASSESSMENT:  CLINICAL IMPRESSION: Similar postural anomalies through pelvis noted today as last visit but less extreme. Added single layer heel lift to left shoe following treatment. Minimal vaulting noted over Rt LE with lift but will consider adding second layer depending on return of HS tightness.      REHAB POTENTIAL: Good  CLINICAL DECISION MAKING: Stable/uncomplicated  EVALUATION COMPLEXITY: Low   GOALS: Goals reviewed with patient? Yes  SHORT TERM GOALS: Target date: 07/30/24  Independent with HEP as it has been established in the short term for piriformis stretching without increasing SIJ pain Baseline: Goal status: INITIAL   LONG TERM GOALS: Target date: POC date  LEFS to improve by MDC Baseline:  Goal status: INITIAL  2.  Squat/lift grandchild without increase in pain Baseline:  Goal status: INITIAL  3.  Tolerate driving in the car for at least 30 min without increase in pain Baseline:  Goal status: INITIAL     PLAN:  PT FREQUENCY: 1-2x/week  PT DURATION: POC date  PLANNED INTERVENTIONS: 97164- PT Re-evaluation, 97750- Physical Performance Testing, 97110-Therapeutic exercises, 97530- Therapeutic activity, [id number]- Neuromuscular re-education, 97535- Self Care, 02859- Manual therapy, [id number]- Gait training, [phone]- Aquatic Therapy, [phone] (1-2 muscles), 20561 (3+ muscles)- Dry Needling, Patient/Family education, Balance training, Stair  training, Taping, Joint mobilization, Spinal mobilization, and Cryotherapy.  PLAN FOR NEXT SESSION: recheck alignment    Harlene Cordon, PT, DPT 07/14/2024, 11:03 AM

## 2024-07-15 ENCOUNTER — Encounter (HOSPITAL_BASED_OUTPATIENT_CLINIC_OR_DEPARTMENT_OTHER): Payer: Self-pay | Admitting: Physical Therapy

## 2024-07-18 ENCOUNTER — Encounter (HOSPITAL_BASED_OUTPATIENT_CLINIC_OR_DEPARTMENT_OTHER): Payer: Self-pay | Admitting: Physical Therapy

## 2024-07-18 ENCOUNTER — Ambulatory Visit (HOSPITAL_BASED_OUTPATIENT_CLINIC_OR_DEPARTMENT_OTHER): Admitting: Physical Therapy

## 2024-07-18 DIAGNOSIS — G8929 Other chronic pain: Secondary | ICD-10-CM | POA: Diagnosis not present

## 2024-07-18 DIAGNOSIS — M5459 Other low back pain: Secondary | ICD-10-CM

## 2024-07-18 DIAGNOSIS — R293 Abnormal posture: Secondary | ICD-10-CM

## 2024-07-18 DIAGNOSIS — M545 Low back pain, unspecified: Secondary | ICD-10-CM | POA: Diagnosis not present

## 2024-07-18 DIAGNOSIS — M533 Sacrococcygeal disorders, not elsewhere classified: Secondary | ICD-10-CM | POA: Diagnosis not present

## 2024-07-18 NOTE — Therapy (Signed)
 OUTPATIENT PHYSICAL THERAPY EVALUATION   Patient Name: Priscilla Underwood MRN: [id number] DOB:[birth year], 63 y.o., female Today's Date: 07/18/2024  END OF SESSION:  PT End of Session - 07/18/24 1403     Visit Number 3    Number of Visits 12    Date for PT Re-Evaluation 08/27/24    Authorization Type BCBS    PT Start Time 1402    PT Stop Time 1445    PT Time Calculation (min) 43 min    Activity Tolerance Patient tolerated treatment well    Behavior During Therapy WFL for tasks assessed/performed          Past Medical History:  Diagnosis Date   Arthritis    osteo arthritis   Carpal tunnel syndrome    Depression    Fatty liver    Fibromyalgia    Fibromyalgia    GERD (gastroesophageal reflux disease)    Low back pain    Osteoarthritis    Osteoarthritis    Pain in joint    Polyarthralgia    PONV (postoperative nausea and vomiting)    Past Surgical History:  Procedure Laterality Date   12 HOUR PH STUDY N/A 12/14/2019   Procedure: 24 HOUR PH STUDY;  Surgeon: Shila Gustav GAILS, MD;  Location: [facility];  Service: Endoscopy;  Laterality: N/A;   BUNIONECTOMY  2004   CARPAL TUNNEL RELEASE Left 06/15/2015   Procedure: LEFT CARPAL TUNNEL RELEASE;  Surgeon: Elsie Mussel, MD;  Location: [facility];  Service: Orthopedics;  Laterality: Left;   CARPAL TUNNEL RELEASE Right 08/31/2015   Procedure: RIGHT LIMITED OPEN CARPAL TUNNEL RELEASE;  Surgeon: Elsie Mussel, MD;  Location: [facility];  Service: Orthopedics;  Laterality: Right;   ESOPHAGEAL MANOMETRY N/A 12/14/2019   Procedure: ESOPHAGEAL MANOMETRY (EM);  Surgeon: Shila Gustav GAILS, MD;  Location: [facility];  Service: Endoscopy;  Laterality: N/A;  DR. EDA   LIPOMA EXCISION Right 05/28/2018   Procedure: EXCISION LIPOMA OF SCALP;  Surgeon: Mable Lenis, MD;  Location: [facility];  Service: ENT;  Laterality: Right;   SHOULDER SURGERY  2010, 2008   TARSAL TUNNEL RELEASE Left  2012   TUBAL LIGATION  1985   Patient Active Problem List   Diagnosis Date Noted   Anxiety 06/24/2021   Depressive disorder 06/24/2021   Gastroesophageal reflux disease    Globus sensation    Belching    Lipoma of scalp 05/28/2018     REFERRING PROVIDER:  Genelle Standing, MD     REFERRING DIAG:  M53.3,G89.29 (ICD-10-CM) - Chronic right SI joint pain      Rationale for Evaluation and Treatment: Rehabilitation  THERAPY DIAG:  Other low back pain  Abnormal posture  ONSET DATE: in the last 2 weeks   SUBJECTIVE:  SUBJECTIVE STATEMENT: Rt hamstrings were helped with DN, SIJ region somewhat improved. Lt hamstrings are tight.   PERTINENT HISTORY:  Chronic buttock pain  PAIN:  Are you having pain? Yes: NPRS scale: 2-3 seated short periods Pain location: SIJ Pain description: twinge, sore Aggravating factors: driving Relieving factors: stand, stretch  PRECAUTIONS:  None  RED FLAGS: None   WEIGHT BEARING RESTRICTIONS:  No  FALLS:  Has patient fallen in last 6 months? No  OCCUPATION:  Retired but goes to [employer] often to [employer] granddaughter  PLOF:  Independent  PATIENT GOALS:  Decrease pain  OBJECTIVE:  Note: Objective measures were completed at Evaluation unless otherwise noted.  DIAGNOSTIC FINDINGS:  Past MRI in March 2025 Dr Genelle: small gluteus medius undersurface tear on Rt side  PATIENT SURVEYS:  LEFS  Extreme difficulty/unable (0), Quite a bit of difficulty (1), Moderate difficulty (2), Little difficulty (3), No difficulty (4) Survey date:    Any of your usual work, housework or school activities 2  2. Usual hobbies, recreational or sporting activities 3  3. Getting into/out of the bath 4  4. Walking between rooms 4  5. Putting on socks/shoes 3  6.  Squatting  2  7. Lifting an object, like a bag of groceries from the floor 2  8. Performing light activities around your home 3  9. Performing heavy activities around your home 2  10. Getting into/out of a car 2  11. Walking 2 blocks 4  12. Walking 1 mile 2  13. Going up/down 10 stairs (1 flight) 2  14. Standing for 1 hour 2  15.  sitting for 1 hour 0  16. Running on even ground 0  17. Running on uneven ground 0  18. Making sharp turns while running fast 0  19. Hopping  0  20. Rolling over in bed 4  Score total:  41/80     POSTURE:   Eval: standing Rt SIJ elevation, Rt ASIS elevated vs Lt, Lt knee extending past 0 with distal femur ER  Supine: LLD- Rt shorter  GAIT:  Comments: EVAL- Rt trendelenburg   Body Part #1 Hip  PALPATION: EVAL: Limited Rt SIJ mobility                                                                                                                              TREATMENT DATE:  Treatment                            8/11: Blank lines following charge title = not provided on this treatment date.   Manual:  TPDN YES Trigger Point Dry Needling  Subsequent Treatment: Instructions provided previously at initial dry needling treatment.   Patient Verbal Consent Given: Yes Education Handout Provided: Previously Provided Muscles Treated: bilateral biceps femoris, Rt glut max/med Electrical Stimulation Performed: No Treatment Response/Outcome: twitch with decreased tightness, soreness as expected IASTM bilateral hamstrings  There-ex: Active hamstring stretch Supine piriformis stretch Standing self correction (Hesch) for Rt anterior ilium There-Act:  Self Care:  Nuro-Re-ed: Ab set+ball roll up with adductor activation Prone superman Gait Training:     Treatment                            8/7: Blank lines following charge title = not provided on this treatment date.   Manual:  TPDN YES Trigger Point Dry Needling  Subsequent Treatment:  Instructions provided previously at initial dry needling treatment.  From previous POC  Patient Verbal Consent Given: Yes Education Handout Provided: Previously Provided Muscles Treated: Lt TFL, glut max/med, piriformis, biceps femoris  Electrical Stimulation Performed: No Treatment Response/Outcome: twitch with decreased spasm STM to all musculature following TPDN LAD RLE through abd 25-45 deg There-ex: Standing overhead reach of right arm with deep breathing Hamstring stretching There-Act: Single layer heel lift to left shoe- walking with and without Self Care:  Nuro-Re-ed:  Gait Training:    07/11/24 EVAL:  Current workout 4-5/week: elliiptical or bike <3 miles with constant buttock pain, does stretchig and some resistance PA spring mobs Rt SIJ STM/roll to Rt piriformis & HS Eccentric resisted prone HS curl Supine ab set with ball bw knees- close rib cage without activating HS   PATIENT EDUCATION:  Education details: Teacher, music of condition, POC, HEP, exercise form/rationale Person educated: Patient Education method: Explanation, Demonstration, Tactile cues, Verbal cues, and Handouts Education comprehension: verbalized understanding, returned demonstration, verbal cues required, tactile cues required, and needs further education  HOME EXERCISE PROGRAM: Ab set with iso add Rt overhead reach with breathing   ASSESSMENT:  CLINICAL IMPRESSION: Limited posterior rotation of right innominate in movement through child pose. Appropriate pull felt in self correction position and will do this BID for 6 days and then 1/day after.     REHAB POTENTIAL: Good  CLINICAL DECISION MAKING: Stable/uncomplicated  EVALUATION COMPLEXITY: Low   GOALS: Goals reviewed with patient? Yes  SHORT TERM GOALS: Target date: 07/30/24  Independent with HEP as it has been established in the short term for piriformis stretching without increasing SIJ pain Baseline: Goal status:  INITIAL   LONG TERM GOALS: Target date: POC date  LEFS to improve by MDC Baseline:  Goal status: INITIAL  2.  Squat/lift grandchild without increase in pain Baseline:  Goal status: INITIAL  3.  Tolerate driving in the car for at least 30 min without increase in pain Baseline:  Goal status: INITIAL     PLAN:  PT FREQUENCY: 1-2x/week  PT DURATION: POC date  PLANNED INTERVENTIONS: 97164- PT Re-evaluation, 97750- Physical Performance Testing, 97110-Therapeutic exercises, 97530- Therapeutic activity, [id number]- Neuromuscular re-education, 97535- Self Care, 02859- Manual therapy, [id number]- Gait training, [phone]- Aquatic Therapy, [phone] (1-2 muscles), [id number] (3+ muscles)- Dry Needling, Patient/Family education, Balance training, Stair training, Taping, Joint mobilization, Spinal mobilization, and Cryotherapy.  PLAN FOR NEXT SESSION: recheck alignment    Harlene Cordon, PT, DPT 07/18/2024, 4:43 PM

## 2024-07-20 ENCOUNTER — Ambulatory Visit (HOSPITAL_BASED_OUTPATIENT_CLINIC_OR_DEPARTMENT_OTHER): Admitting: Physical Therapy

## 2024-07-20 ENCOUNTER — Encounter (HOSPITAL_BASED_OUTPATIENT_CLINIC_OR_DEPARTMENT_OTHER): Payer: Self-pay | Admitting: Physical Therapy

## 2024-07-20 DIAGNOSIS — M533 Sacrococcygeal disorders, not elsewhere classified: Secondary | ICD-10-CM | POA: Diagnosis not present

## 2024-07-20 DIAGNOSIS — G8929 Other chronic pain: Secondary | ICD-10-CM | POA: Diagnosis not present

## 2024-07-20 DIAGNOSIS — R293 Abnormal posture: Secondary | ICD-10-CM | POA: Diagnosis not present

## 2024-07-20 DIAGNOSIS — M545 Low back pain, unspecified: Secondary | ICD-10-CM | POA: Diagnosis not present

## 2024-07-20 DIAGNOSIS — M5459 Other low back pain: Secondary | ICD-10-CM

## 2024-07-20 NOTE — Therapy (Signed)
 OUTPATIENT PHYSICAL THERAPY EVALUATION   Patient Name: Priscilla Underwood MRN: [id number] DOB:[birth year], 63 y.o., female Today's Date: 07/20/2024  END OF SESSION:  PT End of Session - 07/20/24 0937     Visit Number 4    Number of Visits 12    Date for PT Re-Evaluation 08/27/24    Authorization Type BCBS    PT Start Time [id number]    PT Stop Time 1015    PT Time Calculation (min) 39 min    Activity Tolerance Patient tolerated treatment well    Behavior During Therapy WFL for tasks assessed/performed          Past Medical History:  Diagnosis Date   Arthritis    osteo arthritis   Carpal tunnel syndrome    Depression    Fatty liver    Fibromyalgia    Fibromyalgia    GERD (gastroesophageal reflux disease)    Low back pain    Osteoarthritis    Osteoarthritis    Pain in joint    Polyarthralgia    PONV (postoperative nausea and vomiting)    Past Surgical History:  Procedure Laterality Date   44 HOUR PH STUDY N/A 12/14/2019   Procedure: 24 HOUR PH STUDY;  Surgeon: Shila Gustav GAILS, MD;  Location: [facility];  Service: Endoscopy;  Laterality: N/A;   BUNIONECTOMY  2004   CARPAL TUNNEL RELEASE Left 06/15/2015   Procedure: LEFT CARPAL TUNNEL RELEASE;  Surgeon: Elsie Mussel, MD;  Location: [facility];  Service: Orthopedics;  Laterality: Left;   CARPAL TUNNEL RELEASE Right 08/31/2015   Procedure: RIGHT LIMITED OPEN CARPAL TUNNEL RELEASE;  Surgeon: Elsie Mussel, MD;  Location: [facility];  Service: Orthopedics;  Laterality: Right;   ESOPHAGEAL MANOMETRY N/A 12/14/2019   Procedure: ESOPHAGEAL MANOMETRY (EM);  Surgeon: Shila Gustav GAILS, MD;  Location: [facility];  Service: Endoscopy;  Laterality: N/A;  DR. EDA   LIPOMA EXCISION Right 05/28/2018   Procedure: EXCISION LIPOMA OF SCALP;  Surgeon: Mable Lenis, MD;  [facility];  Service: ENT;  Laterality: Right;   SHOULDER SURGERY  2010, 2008   TARSAL TUNNEL RELEASE Left  2012   TUBAL LIGATION  1985   Patient Active Problem List   Diagnosis Date Noted   Anxiety 06/24/2021   Depressive disorder 06/24/2021   Gastroesophageal reflux disease    Globus sensation    Belching    Lipoma of scalp 05/28/2018     REFERRING PROVIDER:  Genelle Standing, MD     REFERRING DIAG:  M53.3,G89.29 (ICD-10-CM) - Chronic right SI joint pain      Rationale for Evaluation and Treatment: Rehabilitation  THERAPY DIAG:  Other low back pain  Abnormal posture  ONSET DATE: in the last 2 weeks   SUBJECTIVE:  SUBJECTIVE STATEMENT: Rt hamstrings were helped with DN, SIJ region somewhat improved. Lt hamstrings are tight.   PERTINENT HISTORY:  Chronic buttock pain  PAIN:  Are you having pain? Yes: NPRS scale: 2-3 seated short periods Pain location: SIJ Pain description: twinge, sore Aggravating factors: driving Relieving factors: stand, stretch  PRECAUTIONS:  None  RED FLAGS: None   WEIGHT BEARING RESTRICTIONS:  No  FALLS:  Has patient fallen in last 6 months? No  OCCUPATION:  Retired but goes to [employer] often to [employer] granddaughter  PLOF:  Independent  PATIENT GOALS:  Decrease pain  OBJECTIVE:  Note: Objective measures were completed at Evaluation unless otherwise noted.  DIAGNOSTIC FINDINGS:  Past MRI in March 2025 Dr Genelle: small gluteus medius undersurface tear on Rt side  PATIENT SURVEYS:  LEFS  Extreme difficulty/unable (0), Quite a bit of difficulty (1), Moderate difficulty (2), Little difficulty (3), No difficulty (4) Survey date:    Any of your usual work, housework or school activities 2  2. Usual hobbies, recreational or sporting activities 3  3. Getting into/out of the bath 4  4. Walking between rooms 4  5. Putting on socks/shoes 3  6.  Squatting  2  7. Lifting an object, like a bag of groceries from the floor 2  8. Performing light activities around your home 3  9. Performing heavy activities around your home 2  10. Getting into/out of a car 2  11. Walking 2 blocks 4  12. Walking 1 mile 2  13. Going up/down 10 stairs (1 flight) 2  14. Standing for 1 hour 2  15.  sitting for 1 hour 0  16. Running on even ground 0  17. Running on uneven ground 0  18. Making sharp turns while running fast 0  19. Hopping  0  20. Rolling over in bed 4  Score total:  41/80     POSTURE:   Eval: standing Rt SIJ elevation, Rt ASIS elevated vs Lt, Lt knee extending past 0 with distal femur ER  Supine: LLD- Rt shorter  GAIT:  Comments: EVAL- Rt trendelenburg   Body Part #1 Hip  PALPATION: EVAL: Limited Rt SIJ mobility  MMT (lb via HHD) Rt/Lt 8/13   Hip flexion 25.3 p!/23.6   Hip extension- qped knee ext 23.1/24.0   Hip abduction 26.1/26.7   Hip adduction    Hip internal rotation    Hip external rotation    Knee flexion 18.5 p!/20   Knee extension 45/43.5    (Blank rows = not tested)                                                                                                                               TREATMENT DATE:  Treatment                            8/13: Blank lines following charge title =  not provided on this treatment date.   Manual:  TPDN YES Trigger Point Dry Needling  Subsequent Treatment: Instructions provided previously at initial dry needling treatment.   Patient Verbal Consent Given: Yes Education Handout Provided: Previously Provided Muscles Treated: bilateral lumbar paraspinals L 3, 4, 5; Lt biceps femoris in distal 1/3 Electrical Stimulation Performed: No Treatment Response/Outcome: twitch with decreased muscular tension PA central and Rt-sided mobs L3, 4, 5 There-ex: Prone upper body extension with short hold for lumbar stability.  Discussed lumbopelvic program to be continued at home  & hold on correction motion.  There-Act:  Self Care:  Nuro-Re-ed:  Gait Training:    Treatment                            8/11: Blank lines following charge title = not provided on this treatment date.   Manual:  TPDN YES Trigger Point Dry Needling  Subsequent Treatment: Instructions provided previously at initial dry needling treatment.   Patient Verbal Consent Given: Yes Education Handout Provided: Previously Provided Muscles Treated: bilateral biceps femoris, Rt glut max/med Electrical Stimulation Performed: No Treatment Response/Outcome: twitch with decreased tightness, soreness as expected IASTM bilateral hamstrings There-ex: Active hamstring stretch Supine piriformis stretch Standing self correction (Hesch) for Rt anterior ilium There-Act:  Self Care:  Nuro-Re-ed: Ab set+ball roll up with adductor activation Prone superman Gait Training:     Treatment                            8/7: Blank lines following charge title = not provided on this treatment date.   Manual:  TPDN YES Trigger Point Dry Needling  Subsequent Treatment: Instructions provided previously at initial dry needling treatment.  From previous POC  Patient Verbal Consent Given: Yes Education Handout Provided: Previously Provided Muscles Treated: Lt TFL, glut max/med, piriformis, biceps femoris  Electrical Stimulation Performed: No Treatment Response/Outcome: twitch with decreased spasm STM to all musculature following TPDN LAD RLE through abd 25-45 deg There-ex: Standing overhead reach of right arm with deep breathing Hamstring stretching There-Act: Single layer heel lift to left shoe- walking with and without Self Care:  Nuro-Re-ed:  Gait Training:    07/11/24 EVAL:  Current workout 4-5/week: elliiptical or bike <3 miles with constant buttock pain, does stretchig and some resistance PA spring mobs Rt SIJ STM/roll to Rt piriformis & HS Eccentric resisted prone HS curl Supine ab  set with ball bw knees- close rib cage without activating HS   PATIENT EDUCATION:  Education details: Anatomy of condition, POC, HEP, exercise form/rationale Person educated: Patient Education method: Explanation, Demonstration, Tactile cues, Verbal cues, and Handouts Education comprehension: verbalized understanding, returned demonstration, verbal cues required, tactile cues required, and needs further education  HOME EXERCISE PROGRAM: Ab set with iso add Rt overhead reach with breathing   ASSESSMENT:  CLINICAL IMPRESSION: Message sent to providers today (Dr Genelle & [place]).  Strength measurements today demonstrate good equality in bilateral hips and LE but pain with hip flexion and knee flexion. Pain is moving distally and s/s are consistent with input from lumbar spine. Past imaging shows Grade 1 anterolisthesis at L4 on L5 and we discussed how this could be contributing considering symptoms. Will be following up with Dr Genelle on 8/22.     REHAB POTENTIAL: Good  CLINICAL DECISION MAKING: Stable/uncomplicated  EVALUATION COMPLEXITY: Low   GOALS: Goals reviewed with patient? Yes  SHORT TERM  GOALS: Target date: 07/30/24  Independent with HEP as it has been established in the short term for piriformis stretching without increasing SIJ pain Baseline: Goal status: INITIAL   LONG TERM GOALS: Target date: POC date  LEFS to improve by MDC Baseline:  Goal status: INITIAL  2.  Squat/lift grandchild without increase in pain Baseline:  Goal status: INITIAL  3.  Tolerate driving in the car for at least 30 min without increase in pain Baseline:  Goal status: INITIAL     PLAN:  PT FREQUENCY: 1-2x/week  PT DURATION: POC date  PLANNED INTERVENTIONS: 97164- PT Re-evaluation, 97750- Physical Performance Testing, 97110-Therapeutic exercises, 97530- Therapeutic activity, [id number]- Neuromuscular re-education, 97535- Self Care, 02859- Manual therapy, [id number]- Gait training,  [phone]- Aquatic Therapy, [phone] (1-2 muscles), 20561 (3+ muscles)- Dry Needling, Patient/Family education, Balance training, Stair training, Taping, Joint mobilization, Spinal mobilization, and Cryotherapy.  PLAN FOR NEXT SESSION: recheck alignment    Harlene Cordon, PT, DPT 07/20/2024, 11:37 AM

## 2024-07-24 ENCOUNTER — Encounter (HOSPITAL_BASED_OUTPATIENT_CLINIC_OR_DEPARTMENT_OTHER): Payer: Self-pay | Admitting: Physical Therapy

## 2024-07-26 NOTE — Progress Notes (Unsigned)
 Cardiology Office Note:    Date:  07/27/2024   ID:  Priscilla Underwood, DOB 1961-03-31, MRN [id number]  PCP:  Frederik Charleston, MD  Cardiologist:  Lonni LITTIE Nanas, MD  Electrophysiologist:  None   Referring MD: Crecencio Chiquita POUR, FNP   Chief Complaint  Patient presents with   Atrial Fibrillation    History of Present Illness:    Priscilla Underwood is a 63 y.o. female with a hx of fibromyalgia who is referred by Chiquita Crecencio, NP for evaluation of atrial fibrillation.  She presented to ED 03/17/24 with nausea/vomiting/diarrhea.  She was found in A-fib with RVR and hypotensive.  She received IV fluids and briefly required pressors.  A-fib resolved spontaneously.  She was previously seen by cardiology in the 11/2019 for chest pain.  Echo 11/22/2019 showed LVEF 60 to 65%, normal RV, aneurysmal interatrial septum, no significant valve disease.  Coronary CTA 12/2019 showed normal coronary arteries.  She reports she was very symptomatic when in A-fib.  Has had some palpitations since that time but has not felt like she has been back in A-fib.  Palpitations occur couple times per month.  She is drinking 10 cups of coffee per day.  States that palpitations will feel like fluttering in chest, last about 30 seconds or so.  She denies any chest pain, dyspnea, lightheadedness, syncope, or lower extremity edema.  She exercises regularly by going to the gym and does elliptical and rides bike and does resistance training.  She smoked as a teenager.  Father had CABG at 21 and redo CABG at 47.   Past Medical History:  Diagnosis Date   Arthritis    osteo arthritis   Carpal tunnel syndrome    Depression    Fatty liver    Fibromyalgia    Fibromyalgia    GERD (gastroesophageal reflux disease)    Low back pain    Osteoarthritis    Osteoarthritis    Pain in joint    Polyarthralgia    PONV (postoperative nausea and vomiting)     Past Surgical History:  Procedure Laterality Date   38 HOUR PH STUDY  N/A 12/14/2019   Procedure: 24 HOUR PH STUDY;  Surgeon: Shila Gustav GAILS, MD;  Location: [facility];  Service: Endoscopy;  Laterality: N/A;   BUNIONECTOMY  2004   CARPAL TUNNEL RELEASE Left 06/15/2015   Procedure: LEFT CARPAL TUNNEL RELEASE;  Surgeon: Elsie Mussel, MD;  Location: [facility];  Service: Orthopedics;  Laterality: Left;   CARPAL TUNNEL RELEASE Right 08/31/2015   Procedure: RIGHT LIMITED OPEN CARPAL TUNNEL RELEASE;  Surgeon: Elsie Mussel, MD;  Location: [facility];  Service: Orthopedics;  Laterality: Right;   ESOPHAGEAL MANOMETRY N/A 12/14/2019   Procedure: ESOPHAGEAL MANOMETRY (EM);  Surgeon: Shila Gustav GAILS, MD;  Location: [facility];  Service: Endoscopy;  Laterality: N/A;  DR. EDA   LIPOMA EXCISION Right 05/28/2018   Procedure: EXCISION LIPOMA OF SCALP;  Surgeon: Mable Lenis, MD;  Location: [facility];  Service: ENT;  Laterality: Right;   SHOULDER SURGERY  2010, 2008   TARSAL TUNNEL RELEASE Left 2012   TUBAL LIGATION  1985    Current Medications: Current Meds  Medication Sig   buPROPion  (WELLBUTRIN  XL) 150 MG 24 hr tablet Take 1 tablet (150 mg) by mouth every morning.   desvenlafaxine  (PRISTIQ ) 50 MG 24 hr tablet Take 1 tablet (50 mg total) by mouth daily.   diclofenac  (VOLTAREN ) 75 MG EC tablet Take 1 tablet (  75 mg) by mouth daily for 90 days.   famotidine (PEPCID) 10 MG tablet Take 10 mg by mouth daily.   methocarbamol  (ROBAXIN ) 500 MG tablet Take 1 tablet (500 mg total) by mouth 3 (three) times daily for 30 days.   pantoprazole  (PROTONIX ) 40 MG tablet Take 1 tablet (40 mg total) by mouth 2 (two) times daily.   [DISCONTINUED] gabapentin  (NEURONTIN ) 100 MG capsule Take 3 capsules (300 mg total) by mouth daily as directed (Patient taking differently: Take 100 mg by mouth daily.)     Allergies:   Ciprofloxacin and Avelox [moxifloxacin hcl in nacl]   Social History   Socioeconomic History   Marital status:  Married    Spouse name: Not on file   Number of children: Not on file   Years of education: Not on file   Highest education level: Not on file  Occupational History   Not on file  Tobacco Use   Smoking status: Former    Types: Cigarettes   Smokeless tobacco: Never   Tobacco comments:    Quit 35 years ago  Vaping Use   Vaping status: Never Used  Substance and Sexual Activity   Alcohol use: No   Drug use: No   Sexual activity: Yes    Birth control/protection: Post-menopausal  Other Topics Concern   Not on file  Social History Narrative   Not on file   Social Drivers of Health   Financial Resource Strain: Low Risk  (11/20/2023)   Received from [facility]   Overall Financial Resource Strain (CARDIA)    Difficulty of Paying Living Expenses: Not hard at all  Food Insecurity: No Food Insecurity (11/20/2023)   Received [facility]   Hunger Vital Sign    Within the past 12 months, you worried that your food would run out before you got the money to buy more.: Never true    Within the past 12 months, the food you bought just didn't last and you didn't have money to get more.: Never true  Transportation Needs: No Transportation Needs (11/20/2023)   Received from [facility] - Transportation    Lack of Transportation (Medical): No    Lack of Transportation (Non-Medical): No  Physical Activity: Sufficiently Active (11/20/2023)   Received from [facility]   Exercise Vital Sign    On average, how many days per week do you engage in moderate to strenuous exercise (like a brisk walk)?: 3 days    On average, how many minutes do you engage in exercise at this level?: 60 min  Stress: No Stress Concern Present (11/20/2023)   Received from [facility] - Occupational Stress Questionnaire    Feeling of Stress : Not at all  Social Connections: Socially Integrated (11/20/2023)   Received from [facility]   Social Network     How would you rate your social network (family, work, friends)?: Good participation with social networks     Family History: The patient's family history includes Breast cancer (age of onset: 69) in her sister; Breast cancer (age of onset: 3) in her paternal aunt; Heart disease in her father; Heart failure in her maternal grandmother; Lung cancer in her maternal grandfather and mother; Pancreatic cancer in her paternal grandmother. There is no history of Colon cancer, Esophageal cancer, Rectal cancer, or Stomach cancer.  ROS:   Please see the history of present illness.     All other systems reviewed and  are negative.  EKGs/Labs/Other Studies Reviewed:    The following studies were reviewed today:   EKG:   07/27/2024: Sinus bradycardia, rate 56, no ST abnormalities  Recent Labs: 03/17/2024: ALT 24; B Natriuretic Peptide 42.9; BUN 16; Creatinine, Ser 0.79; Hemoglobin 14.6; Platelets 177; Potassium 3.7; Sodium 140; TSH 2.730  Recent Lipid Panel No results found for: CHOL, TRIG, HDL, CHOLHDL, VLDL, LDLCALC, LDLDIRECT  Physical Exam:    VS:  BP 128/88   Pulse (!) 56   Ht 5' 5 (1.651 m)   Wt 164 lb 9.6 oz (74.7 kg)   SpO2 93%   BMI 27.39 kg/m     Wt Readings from Last 3 Encounters:  07/27/24 164 lb 9.6 oz (74.7 kg)  11/17/19 183 lb (83 kg)  11/10/19 180 lb (81.6 kg)     GEN:  Well nourished, well developed in no acute distress HEENT: Normal NECK: No JVD; No carotid bruits LYMPHATICS: No lymphadenopathy CARDIAC: RRR, no murmurs, rubs, gallops RESPIRATORY:  Clear to auscultation without rales, wheezing or rhonchi  ABDOMEN: Soft, non-tender, non-distended MUSCULOSKELETAL:  No edema; No deformity  SKIN: Warm and dry NEUROLOGIC:  Alert and oriented x 3 PSYCHIATRIC:  Normal affect   ASSESSMENT:    1. Atrial fibrillation, unspecified type (HCC)   2. Chest pain of uncertain etiology    PLAN:    Atrial fibrillation:  She presented to ED 03/17/24 with  nausea/vomiting/diarrhea.  She was found in A-fib with RVR and hypotensive.  She received IV fluids and briefly required pressors.  A-fib resolved spontaneously.   - CHA2DS2-VASc 1 (female), and suspect episode likely triggered by acute illness, will hold off on anticoagulation at this time - Zio patch x 2 weeks to evaluate for further A-fib - Echocardiogram to evaluate for structural heart disease - Recommend decreasing caffeine intake, currently drinking 10 cups of coffee daily  Chest pain: Echo 11/22/2019 showed LVEF 60 to 65%, normal RV, aneurysmal interatrial septum, no significant valve disease.  Coronary CTA 12/2019 showed normal coronary arteries. - Reports no recent chest pain  RTC in 6 months   Medication Adjustments/Labs and Tests Ordered: Current medicines are reviewed at length with the patient today.  Concerns regarding medicines are outlined above.  Orders Placed This Encounter  Procedures   LONG TERM MONITOR (3-14 DAYS)   EKG 12-Lead   ECHOCARDIOGRAM COMPLETE   No orders of the defined types were placed in this encounter.   Patient Instructions  Medication Instructions:  Your physician recommends that you continue on your current medications as directed. Please refer to the Current Medication list given to you today.  *If you need a refill on your cardiac medications before your next appointment, please call your pharmacy*  Lab Work: NONE ordered at this time of appointment   Testing/Procedures: Your physician has requested that you have an echocardiogram. Echocardiography is a painless test that uses sound waves to create images of your heart. It provides your doctor with information about the size and shape of your heart and how well your heart's chambers and valves are working. This procedure takes approximately one hour. There are no restrictions for this procedure. Please do NOT wear cologne, perfume, aftershave, or lotions (deodorant is allowed). Please arrive  15 minutes prior to your appointment time.  Please note: We ask at that you not bring children with you during ultrasound (echo/ vascular) testing. Due to room size and safety concerns, children are not allowed in the ultrasound rooms during exams. Our front office  staff cannot provide observation of children in our lobby area while testing is being conducted. An adult accompanying a patient to their appointment will only be allowed in the ultrasound room at the discretion of the ultrasound technician under special circumstances. We apologize for any inconvenience.   ZIO XT- Long Term Monitor Instructions  Your physician has requested you wear a ZIO patch monitor for 14 days.  This is a single patch monitor. [employer] supplies one patch monitor per enrollment. Additional stickers are not available. Please do not apply patch if you will be having a Nuclear Stress Test,  Echocardiogram, Cardiac CT, MRI, or Chest Xray during the period you would be wearing the  monitor. The patch cannot be worn during these tests. You cannot remove and re-apply the  ZIO XT patch monitor.  Your ZIO patch monitor will be mailed 3 day USPS to your address on file. It may take 3-5 days  to receive your monitor after you have been enrolled.  Once you have received your monitor, please review the enclosed instructions. Your monitor  has already been registered assigning a specific monitor serial # to you.  Billing and Patient Assistance Program Information  We have supplied [employer] with any of your insurance information on file for billing purposes. [employer] offers a sliding scale Patient Assistance Program for patients that do not have  insurance, or whose insurance does not completely cover the cost of the ZIO monitor.  You must apply for the Patient Assistance Program to qualify for this discounted rate.  To apply, please call [employer] at [phone], select option 4, select option 2, ask to apply for  Patient  Assistance Program. Meredeth will ask your household income, and how many people  are in your household. They will quote your out-of-pocket cost based on that information.  [employer] will also be able to set up a 74-month, interest-free payment plan if needed.  Applying the monitor   Shave hair from upper left chest.  Hold abrader disc by orange tab. Rub abrader in 40 strokes over the upper left chest as  indicated in your monitor instructions.  Clean area with 4 enclosed alcohol pads. Let dry.  Apply patch as indicated in monitor instructions. Patch will be placed under collarbone on left  side of chest with arrow pointing upward.  Rub patch adhesive wings for 2 minutes. Remove white label marked 1. Remove the white  label marked 2. Rub patch adhesive wings for 2 additional minutes.  While looking in a mirror, press and release button in center of patch. A small green light will  flash 3-4 times. This will be your only indicator that the monitor has been turned on.  Do not shower for the first 24 hours. You may shower after the first 24 hours.  Press the button if you feel a symptom. You will hear a small click. Record Date, Time and  Symptom in the Patient Logbook.  When you are ready to remove the patch, follow instructions on the last 2 pages of Patient  Logbook. Stick patch monitor onto the last page of Patient Logbook.  Place Patient Logbook in the blue and white box. Use locking tab on box and tape box closed  securely. The blue and white box has prepaid postage on it. Please place it in the mailbox as  soon as possible. Your physician should have your test results approximately 7 days after the  monitor has been mailed back to [facility].  Call [employer]  Care at [phone] if you have questions regarding  your ZIO XT patch monitor. Call them immediately if you see an orange light blinking on your  monitor.  If your monitor falls off in less than 4 days,  contact our Monitor department at [phone].  If your monitor becomes loose or falls off after 4 days call [employer] at [phone] for  suggestions on securing your monitor   Follow-Up: At [facility], you and your health needs are our priority.  As part of our continuing mission to provide you with exceptional heart care, our providers are all part of one team.  This team includes your primary Cardiologist (physician) and Advanced Practice Providers or APPs (Physician Assistants and Nurse Practitioners) who all work together to provide you with the care you need, when you need it.  Your next appointment:   6 month(s)  Provider:   Lonni LITTIE Nanas, MD    We recommend signing up for the patient portal called MyChart.  Sign up information is provided on this After Visit Summary.  MyChart is used to connect with patients for Virtual Visits (Telemedicine).  Patients are able to view lab/test results, encounter notes, upcoming appointments, etc.  Non-urgent messages can be sent to your provider as well.   To learn more about what you can do with MyChart, go to [email or web address].          Signed, Lonni LITTIE Nanas, MD  07/27/2024 6:04 PM    [facility]

## 2024-07-27 ENCOUNTER — Other Ambulatory Visit (HOSPITAL_BASED_OUTPATIENT_CLINIC_OR_DEPARTMENT_OTHER): Payer: Self-pay | Admitting: Obstetrics and Gynecology

## 2024-07-27 ENCOUNTER — Encounter (HOSPITAL_BASED_OUTPATIENT_CLINIC_OR_DEPARTMENT_OTHER): Payer: Self-pay | Admitting: Orthopaedic Surgery

## 2024-07-27 ENCOUNTER — Ambulatory Visit (INDEPENDENT_AMBULATORY_CARE_PROVIDER_SITE_OTHER)

## 2024-07-27 ENCOUNTER — Encounter: Payer: Self-pay | Admitting: Cardiology

## 2024-07-27 ENCOUNTER — Ambulatory Visit: Attending: Cardiology | Admitting: Cardiology

## 2024-07-27 ENCOUNTER — Other Ambulatory Visit: Payer: Self-pay | Admitting: Obstetrics and Gynecology

## 2024-07-27 VITALS — BP 128/88 | HR 56 | Ht 65.0 in | Wt 164.6 lb

## 2024-07-27 DIAGNOSIS — R0602 Shortness of breath: Secondary | ICD-10-CM

## 2024-07-27 DIAGNOSIS — I4891 Unspecified atrial fibrillation: Secondary | ICD-10-CM

## 2024-07-27 DIAGNOSIS — R079 Chest pain, unspecified: Secondary | ICD-10-CM | POA: Diagnosis not present

## 2024-07-27 DIAGNOSIS — Z01419 Encounter for gynecological examination (general) (routine) without abnormal findings: Secondary | ICD-10-CM | POA: Diagnosis not present

## 2024-07-27 DIAGNOSIS — H938X3 Other specified disorders of ear, bilateral: Secondary | ICD-10-CM | POA: Diagnosis not present

## 2024-07-27 DIAGNOSIS — Z1231 Encounter for screening mammogram for malignant neoplasm of breast: Secondary | ICD-10-CM

## 2024-07-27 DIAGNOSIS — Z9189 Other specified personal risk factors, not elsewhere classified: Secondary | ICD-10-CM

## 2024-07-27 NOTE — Progress Notes (Unsigned)
 Enrolled patient for a 14 day Zio XT  monitor to be mailed to patients home

## 2024-07-27 NOTE — Patient Instructions (Signed)
 Medication Instructions:  Your physician recommends that you continue on your current medications as directed. Please refer to the Current Medication list given to you today.  *If you need a refill on your cardiac medications before your next appointment, please call your pharmacy*  Lab Work: NONE ordered at this time of appointment   Testing/Procedures: Your physician has requested that you have an echocardiogram. Echocardiography is a painless test that uses sound waves to create images of your heart. It provides your doctor with information about the size and shape of your heart and how well your heart's chambers and valves are working. This procedure takes approximately one hour. There are no restrictions for this procedure. Please do NOT wear cologne, perfume, aftershave, or lotions (deodorant is allowed). Please arrive 15 minutes prior to your appointment time.  Please note: We ask at that you not bring children with you during ultrasound (echo/ vascular) testing. Due to room size and safety concerns, children are not allowed in the ultrasound rooms during exams. Our front office staff cannot provide observation of children in our lobby area while testing is being conducted. An adult accompanying a patient to their appointment will only be allowed in the ultrasound room at the discretion of the ultrasound technician under special circumstances. We apologize for any inconvenience.   ZIO XT- Long Term Monitor Instructions  Your physician has requested you wear a ZIO patch monitor for 14 days.  This is a single patch monitor. [employer] supplies one patch monitor per enrollment. Additional stickers are not available. Please do not apply patch if you will be having a Nuclear Stress Test,  Echocardiogram, Cardiac CT, MRI, or Chest Xray during the period you would be wearing the  monitor. The patch cannot be worn during these tests. You cannot remove and re-apply the  ZIO XT patch monitor.  Your  ZIO patch monitor will be mailed 3 day USPS to your address on file. It may take 3-5 days  to receive your monitor after you have been enrolled.  Once you have received your monitor, please review the enclosed instructions. Your monitor  has already been registered assigning a specific monitor serial # to you.  Billing and Patient Assistance Program Information  We have supplied [employer] with any of your insurance information on file for billing purposes. [employer] offers a sliding scale Patient Assistance Program for patients that do not have  insurance, or whose insurance does not completely cover the cost of the ZIO monitor.  You must apply for the Patient Assistance Program to qualify for this discounted rate.  To apply, please call [employer] at [phone], select option 4, select option 2, ask to apply for  Patient Assistance Program. Meredeth will ask your household income, and how many people  are in your household. They will quote your out-of-pocket cost based on that information.  [employer] will also be able to set up a 52-month, interest-free payment plan if needed.  Applying the monitor   Shave hair from upper left chest.  Hold abrader disc by orange tab. Rub abrader in 40 strokes over the upper left chest as  indicated in your monitor instructions.  Clean area with 4 enclosed alcohol pads. Let dry.  Apply patch as indicated in monitor instructions. Patch will be placed under collarbone on left  side of chest with arrow pointing upward.  Rub patch adhesive wings for 2 minutes. Remove white label marked 1. Remove the white  label marked 2. Rub patch adhesive wings for 2  additional minutes.  While looking in a mirror, press and release button in center of patch. A small green light will  flash 3-4 times. This will be your only indicator that the monitor has been turned on.  Do not shower for the first 24 hours. You may shower after the first 24 hours.  Press the button if you feel  a symptom. You will hear a small click. Record Date, Time and  Symptom in the Patient Logbook.  When you are ready to remove the patch, follow instructions on the last 2 pages of Patient  Logbook. Stick patch monitor onto the last page of Patient Logbook.  Place Patient Logbook in the blue and white box. Use locking tab on box and tape box closed  securely. The blue and white box has prepaid postage on it. Please place it in the mailbox as  soon as possible. Your physician should have your test results approximately 7 days after the  monitor has been mailed back to [facility].  Call [facility] Customer Care at [phone] if you have questions regarding  your ZIO XT patch monitor. Call them immediately if you see an orange light blinking on your  monitor.  If your monitor falls off in less than 4 days, contact our Monitor department at [phone].  If your monitor becomes loose or falls off after 4 days call [employer] at [phone] for  suggestions on securing your monitor   Follow-Up: At [facility], you and your health needs are our priority.  As part of our continuing mission to provide you with exceptional heart care, our providers are all part of one team.  This team includes your primary Cardiologist (physician) and Advanced Practice Providers or APPs (Physician Assistants and Nurse Practitioners) who all work together to provide you with the care you need, when you need it.  Your next appointment:   6 month(s)  Provider:   Lonni LITTIE Nanas, MD    We recommend signing up for the patient portal called MyChart.  Sign up information is provided on this After Visit Summary.  MyChart is used to connect with patients for Virtual Visits (Telemedicine).  Patients are able to view lab/test results, encounter notes, upcoming appointments, etc.  Non-urgent messages can be sent to your provider as well.   To learn more about what you can do with MyChart, go to  [email or web address].

## 2024-07-29 ENCOUNTER — Ambulatory Visit (HOSPITAL_BASED_OUTPATIENT_CLINIC_OR_DEPARTMENT_OTHER): Payer: Self-pay | Admitting: Orthopaedic Surgery

## 2024-07-29 ENCOUNTER — Ambulatory Visit (HOSPITAL_BASED_OUTPATIENT_CLINIC_OR_DEPARTMENT_OTHER): Admitting: Orthopaedic Surgery

## 2024-07-29 DIAGNOSIS — M533 Sacrococcygeal disorders, not elsewhere classified: Secondary | ICD-10-CM

## 2024-07-29 DIAGNOSIS — G8929 Other chronic pain: Secondary | ICD-10-CM | POA: Diagnosis not present

## 2024-07-29 NOTE — Progress Notes (Signed)
 Chief Complaint: Right hip pain        History of Present Illness:    07/29/2024: Presents today for follow-up of her right hip.  She unfortunately has not gotten any relief from her glenohumeral injection as well as trochanteric injection.  She did get some relief from her SI injection   Surgical History:   None   PMH/PSH/Family History/Social History/Meds/Allergies:         Past Medical History:  Diagnosis Date   Arthritis      osteo arthritis   Depression     Fatty liver     Fibromyalgia     GERD (gastroesophageal reflux disease)     PONV (postoperative nausea and vomiting)               Past Surgical History:  Procedure Laterality Date   33 HOUR PH STUDY N/A 12/14/2019    Procedure: 24 HOUR PH STUDY;  Surgeon: Shila Gustav GAILS, MD;  Location: [facility];  Service: Endoscopy;  Laterality: N/A;   BUNIONECTOMY   2004   CARPAL TUNNEL RELEASE Left 06/15/2015    Procedure: LEFT CARPAL TUNNEL RELEASE;  Surgeon: Elsie Mussel, MD;  Location: [facility];  Service: Orthopedics;  Laterality: Left;   CARPAL TUNNEL RELEASE Right 08/31/2015    Procedure: RIGHT LIMITED OPEN CARPAL TUNNEL RELEASE;  Surgeon: Elsie Mussel, MD;  Location: [facility];  Service: Orthopedics;  Laterality: Right;   ESOPHAGEAL MANOMETRY N/A 12/14/2019    Procedure: ESOPHAGEAL MANOMETRY (EM);  Surgeon: Shila Gustav GAILS, MD;  Location: [facility];  Service: Endoscopy;  Laterality: N/A;  DR. EDA   LIPOMA EXCISION Right 05/28/2018    Procedure: EXCISION LIPOMA OF SCALP;  Surgeon: Mable Lenis, MD;  Location: [facility];  Service: ENT;  Laterality: Right;   SHOULDER SURGERY   2010, 2008   TARSAL TUNNEL RELEASE Left 2012   TUBAL LIGATION   1985        Social History         Socioeconomic History   Marital status: Married      Spouse name: Not on file   Number of children: Not on file   Years of education: Not on  file   Highest education level: Not on file  Occupational History   Not on file  Tobacco Use   Smoking status: Former      Types: Cigarettes   Smokeless tobacco: Never   Tobacco comments:      Quit 35 years ago  Vaping Use   Vaping status: Never Used  Substance and Sexual Activity   Alcohol use: No   Drug use: No   Sexual activity: Yes      Birth control/protection: Post-menopausal  Other Topics Concern   Not on file  Social History Narrative   Not on file    Social Drivers of Health        Financial Resource Strain: Low Risk  (11/20/2023)    Received from [facility]    Overall Financial Resource Strain (CARDIA)     Difficulty of Paying Living Expenses: Not hard at all  Food Insecurity: No Food Insecurity (11/20/2023)    Received from [facility]    Hunger Vital Sign     Worried About Running Out of Food in the  Last Year: Never true     Ran Out of Food in the Last Year: Never true  Transportation Needs: No Transportation Needs (11/20/2023)    Received from [facility]    PRAPARE - Transportation     Lack of Transportation (Medical): No     Lack of Transportation (Non-Medical): No  Physical Activity: Sufficiently Active (11/20/2023)    Received from [facility]    Exercise Vital Sign     Days of Exercise per Week: 3 days     Minutes of Exercise per Session: 60 min  Stress: No Stress Concern Present (11/20/2023)    Received from [facility] - Occupational Stress Questionnaire     Feeling of Stress : Not at all  Social Connections: Socially Integrated (11/20/2023)    Received from [facility]    Social Network     How would you rate your social network (family, work, friends)?: Good participation with social networks         Family History  Problem Relation Age of Onset   Breast cancer Sister 61   Breast cancer Paternal Aunt 57   Lung cancer Mother     Heart disease Father     Heart failure Maternal  Grandmother     Lung cancer Maternal Grandfather     Pancreatic cancer Paternal Grandmother     Colon cancer Neg Hx     Esophageal cancer Neg Hx     Rectal cancer Neg Hx     Stomach cancer Neg Hx          Allergies      Allergies  Allergen Reactions   Ciprofloxacin Rash   Avelox [Moxifloxacin Hcl In Nacl] Rash            Current Outpatient Medications  Medication Sig Dispense Refill   albuterol  (VENTOLIN  HFA) 108 (90 Base) MCG/ACT inhaler Inhale 2 puffs into the lungs every 6 hours as needed 8.5 g 0   amoxicillin -clavulanate (AUGMENTIN ) 875-125 MG tablet Take 1 tablet by mouth every 12 (twelve) hours for 10 days. 20 tablet 0   Azelastine  HCl (ASTEPRO ) 0.15 % SOLN Use 2 sprays in each nostril once daily. 30 mL 1   Azelastine  HCl (ASTEPRO ) 0.15 % SOLN Use 2 sprays in each nostril once a day. 30 mL 1   azithromycin  (ZITHROMAX ) 250 MG tablet Take 2 tablets by mouth on the first day, then take 1 tablet daily for 4 days. 6 tablet 0   buPROPion  (WELLBUTRIN  XL) 150 MG 24 hr tablet TAKE 1 TABLET BY MOUTH IN THE MORNING 90 tablet 0   buPROPion  (WELLBUTRIN  XL) 150 MG 24 hr tablet Take 1 tablet by mouth once daily in the morning 90 tablet 1   buPROPion  (WELLBUTRIN  XL) 150 MG 24 hr tablet Take 1 tablet by mouth once daily in the morning 90 tablet 2   buPROPion  (WELLBUTRIN  XL) 150 MG 24 hr tablet Take 1 tablet (150 mg) by mouth every morning. 30 tablet 1   desvenlafaxine  (PRISTIQ ) 100 MG 24 hr tablet desvenlafaxine  ER 100 mg tablet,extended release 24 hr  Take 1 tablet every day by oral route.       desvenlafaxine  (PRISTIQ ) 100 MG 24 hr tablet Take 1 tablet (100 mg total) by mouth daily. 90 tablet 1   desvenlafaxine  (PRISTIQ ) 50 MG 24 hr tablet Take 1 tablet (50 mg total) by mouth daily. 30 tablet 2   desvenlafaxine  (PRISTIQ ) 50 MG 24 hr  tablet Take 1 tablet (50 mg total) by mouth daily. 90 tablet 2   diclofenac  (VOLTAREN ) 75 MG EC tablet Take 1 tablet (75 mg total) by mouth 2 (two) times daily  as needed. 60 tablet 3   diclofenac  (VOLTAREN ) 75 MG EC tablet Take 1 tablet (75 mg) by mouth daily for 90 days. 90 tablet 1   doxycycline  (ADOXA) 100 MG tablet Take 1 tablet twice daily for 10 days. 20 tablet 0   esomeprazole  (NEXIUM ) 40 MG capsule Take 40 mg by mouth daily at 12 noon.       famotidine (PEPCID) 10 MG tablet Take 10 mg by mouth daily.       gabapentin  (NEURONTIN ) 100 MG capsule Take 3 capsules (300 mg total) by mouth daily as directed 90 capsule 3   methocarbamol  (ROBAXIN ) 500 MG tablet Take 500 mg by mouth every 8 (eight) hours as needed for muscle spasms.       methocarbamol  (ROBAXIN ) 500 MG tablet Take 1 tablet (500 mg total) by mouth 3 (three) times daily for 30 days. 90 tablet 3   metoprolol  tartrate (LOPRESSOR ) 25 MG tablet TAKE 1 TABLET 2 HOURS PRIOR TO CT 1 tablet 0   mupirocin  ointment (BACTROBAN ) 2 % Apply to affected area 2 times a day for 14 days 22 g 0   pantoprazole  (PROTONIX ) 40 MG tablet Take 1 tablet (40 mg total) by mouth 2 (two) times daily. 180 tablet 0   promethazine -dextromethorphan (PROMETHAZINE -DM) 6.25-15 MG/5ML syrup Take 5 mL by mouth every 6 hours as needed for 10 days 200 mL 0   promethazine -dextromethorphan (PROMETHAZINE -DM) 6.25-15 MG/5ML syrup Take 5mls every 6 hours as needed for 5 days. 100 mL 0   sertraline (ZOLOFT) 100 MG tablet Take 100 mg by mouth.    5   sucralfate (CARAFATE) 1 g tablet Take 1 g by mouth 4 (four) times daily -  with meals and at bedtime.       sulfamethoxazole -trimethoprim  (BACTRIM  DS) 800-160 MG tablet Take 1 tablet by mouth 2 times a day for 14 days 28 tablet 0   terbinafine  (LAMISIL ) 250 MG tablet Please take one a day x 7days, repeat every 4 weeks x 4 months 28 tablet 0   terbinafine  (LAMISIL ) 250 MG tablet Take 1 tablet (250 mg total) by mouth daily. 30 tablet 2   traMADol  (ULTRAM ) 50 MG tablet Take 1 tablet  by mouth once a day as needed (30 days) 15 tablet 0   valACYclovir  (VALTREX ) 1000 MG tablet as needed.    2    valACYclovir  (VALTREX ) 500 MG tablet Take 1 tablet (500 mg) by mouth daily. 30 tablet 1   zolpidem  (AMBIEN ) 5 MG tablet Take 5 mg by mouth at bedtime as needed for sleep.    1   zolpidem  (AMBIEN ) 5 MG tablet Take 1 tablet (5 mg total) by mouth at bedtime as needed (please try NOT to take every evening) 30 tablet 3      No current facility-administered medications for this visit.      Imaging Results (Last 48 hours)  No results found.     Review of Systems:   A ROS was performed including pertinent positives and negatives as documented in the HPI.   Physical Exam :   Constitutional: NAD and appears stated age Neurological: Alert and oriented Psych: Appropriate affect and cooperative There were no vitals taken for this visit.    Comprehensive Musculoskeletal Exam:     Right hip with tenderness  about the posterior buttocks as well as the ischial tuberosity and SI joint.  There is pain with positive FADIR.  3 degrees internal/external rotation of the right hip   Imaging:    Right hip MRI: Small undersurface tear of the gluteus medius minimus junction   I personally reviewed and interpreted the radiographs.     Assessment:   63 y.o. female here for follow-up of her right hip.  I did discuss that overall she does have somewhat of a complex hip.  At today's visit my differential diagnosis is favoring more of an ischiofemoral impingement in the setting of a possible piriformis syndrome.  At this time for diagnostic purposes I would like to refer her to Dr. Burnetta to perform an ultrasound-guided injection around the piriformis.  I did describe that if this does provide longer term relief we could consider piriformis release again for ischiofemoral impingement. Plan :     - Return to clinic 4 weeks for assessment of her hip pain following piriformis injection         I personally saw and evaluated the patient, and participated in the management and treatment plan.

## 2024-08-01 ENCOUNTER — Encounter (HOSPITAL_BASED_OUTPATIENT_CLINIC_OR_DEPARTMENT_OTHER): Payer: Self-pay

## 2024-08-01 ENCOUNTER — Ambulatory Visit (HOSPITAL_BASED_OUTPATIENT_CLINIC_OR_DEPARTMENT_OTHER): Admitting: Physical Therapy

## 2024-08-02 ENCOUNTER — Other Ambulatory Visit: Payer: Self-pay

## 2024-08-02 ENCOUNTER — Encounter: Payer: Self-pay | Admitting: Sports Medicine

## 2024-08-02 ENCOUNTER — Ambulatory Visit (INDEPENDENT_AMBULATORY_CARE_PROVIDER_SITE_OTHER): Admitting: Sports Medicine

## 2024-08-02 DIAGNOSIS — G5701 Lesion of sciatic nerve, right lower limb: Secondary | ICD-10-CM

## 2024-08-02 DIAGNOSIS — M7918 Myalgia, other site: Secondary | ICD-10-CM

## 2024-08-02 DIAGNOSIS — M5410 Radiculopathy, site unspecified: Secondary | ICD-10-CM

## 2024-08-02 NOTE — Progress Notes (Signed)
 Priscilla Underwood - 63 y.o. female MRN [id number]  Date of birth: [birth year]  Office Visit Note: Visit Date: 08/02/2024 PCP: Frederik Charleston, MD Referred by: Genelle Standing, MD  Medical Resident, Sports Medicine Fellow - Attending Physician Addendum:   I have independently interviewed and examined the patient myself. I have discussed the above with the original author and agree with their documentation. My edits for correction/addition/clarification have been made, see any changes above and below.   In summary, very pleasant 64 year old female who presents with acute on chronic right buttock pain with associated numbness and tingling down the posterior right leg to the level of the ankle/foot.  Her exact pathology is still in flux, as she has had good partial relief in the short-term from prior ultrasound-guided SI joint injections, but now more so having pain within the piriformis but radicular leg symptoms down the leg.  She has seen both Dr. Genelle and myself.  For both diagnostic and hopefully therapeutic purposes, we did perform an ultrasound-guided piriformis tendon sheath injection with associated sciatic nerve hydrodissection just inferior to the piriformis.  She will really pay attention over the next few weeks the degree of relief she receives from this and whether this improves her numbness and tingling symptoms going down the leg.  If she has persistent radicular symptoms down the leg, I would turn our attention more so to the lumbar spine with further evaluation and possible diagnostic injection.  Okay for over-the-counter anti-inflammatories as needed.  I am happy to see her back as needed.  Lonell Sprang, DO Primary Care Sports Medicine Physician  [facility] - Orthopedics   Subjective: Chief Complaint  Patient presents with   Right Hip - Pain   HPI: Priscilla Underwood is a pleasant 63 y.o. female who presents today for further evaluation of chronic R > L buttock  pain. Here to consider planned injection for right piriformis as well per the recommendation of Dr. Standing Genelle.  Patient says she has been having chronic gluteal pain for multiple years and recently has also developed recurrent radiculopathy down into her right foot. She was evaluated by Dr. Genelle on 07/29/24 for continuing pain and recommended piriformis injection for his differential diagnosis of ischiofemoral impingement vs piriformis syndrome.  Plan today for injection that is hopefully both diagnostic and therapeutic.  She will follow-up with Dr. Genelle on in roughly 4 weeks for reassessment after completion of piriformis injection by ourselves.  Pertinent ROS were reviewed with the patient and found to be negative unless otherwise specified above in HPI.   Assessment & Plan: Visit Diagnoses:  1. Right buttock pain   2. Piriformis syndrome of right side   3. Radicular syndrome of right leg    Plan: Completed ultrasound-guided right piriformis steroid injection with partial hydrodissection of sciatic nerve using additional volume of D5.  Patient tolerated procedure well.  See procedure note  below for additional details.  Patient follows with Dr. Genelle.  Plan for patient to return for follow-up appointment with him in 4 weeks to assess efficacy of injection and discuss next steps in treatment plan.  Patient understands treatment plan has no further questions or concerns at this time.  Follow-up: Return if symptoms worsen or fail to improve.   Meds & Orders: No orders of the defined types were placed in this encounter.   Orders Placed This Encounter  Procedures   US  Guided Needle Placement - No Linked Charges     Procedures:  Procedure: US -guided piriformis injection, Right After discussion on risks/benefits/indications and informed verbal consent was obtained, a timeout was performed. Patient was lying prone on exam table.The posterior hip and buttock was cleaned with Chloraprep  and multiple alcohol swabs. Then utilizing ultrasound guidance, the patient's piriformis muscle and sheath was identified in a long-axis plane.The area of soft tissue was first anesthesized with 4 cc of lidocaine  1%. Then, using an in-plane lateral to medial approach using ultrasound-guidance the piriformis muscle sheath was injected with a combination of  2:2:1 lidocaine :bupivicaine:betamethasone  with an addition 5 ml of D50 (for hydrodissection) with visualization of spread through the muscular sheath. Patient tolerated well without immediate complications.  Band-Aid was applied.    Clinical History: No specialty comments available.  She reports that she has quit smoking. Her smoking use included cigarettes. She has never used smokeless tobacco. No results for input(s): HGBA1C, LABURIC in the last 8760 hours.  Objective:    Physical Exam  Gen: Well-appearing, in no acute distress; non-toxic CV: Well-perfused. Warm.  Resp: Breathing unlabored on room air; no wheezing. Psych: Fluid speech in conversation; appropriate affect; normal thought process  Ortho Exam - Tenderness to palpation over right piriformis musculature with reproduction of pain and radiculopathy.  No evidence of generalized edema, erythema, or ecchymoses.  Imaging:  Narrative & Impression  CLINICAL DATA:  Five year history of back pain   EXAM: MRI LUMBAR SPINE WITHOUT CONTRAST   TECHNIQUE: Multiplanar, multisequence MR imaging of the lumbar spine was performed. No intravenous contrast was administered.   COMPARISON:  None Available.   FINDINGS: Segmentation:  Standard.   Alignment: Trace retrolisthesis of L1 on L2. Grade 1 anterolisthesis of L3 on L4.   Vertebrae: No fracture, evidence of discitis, or bone lesion. Small Schmorl's node at L5.   Conus medullaris and cauda equina: Conus extends to the L2 level. Conus and cauda equina appear normal.   Paraspinal and other soft tissues: Negative.   Disc  levels:   T12-L1: Mild bilateral facet degenerative change. Minimal disc bulge. No spinal canal narrowing. No neural foraminal narrowing.   L1-L2: Eccentric left disc bulge. Mild bilateral facet degenerative change. Mild spinal canal narrowing. Moderate left and mild right neural foraminal narrowing.   L2-L3: Mild bilateral facet degenerative change. No significant disc bulge. No spinal canal narrowing. No neural foraminal narrowing.   L3-L4: Moderate bilateral facet degenerative change. Circumferential disc bulge. Moderate spinal canal narrowing. Mild left and moderate right neural foraminal narrowing.   L4-L5: Mild bilateral facet degenerative change. Circumferential disc bulge. Mild spinal canal narrowing. Mild bilateral neural foraminal narrowing.   L5-S1: Central disc protrusion. Moderate left and mild right facet degenerative change. Mild overall spinal canal narrowing. Mild bilateral neural foraminal narrowing.   IMPRESSION: Multilevel degenerative changes of the lumbar spine, most pronounced at L3-L4 where there is moderate spinal canal narrowing and moderate right neural foraminal narrowing.     Electronically Signed   By: Lyndall Gore M.D.   On: 09/08/2023 14:39    Past Medical/Family/Surgical/Social History: Medications & Allergies reviewed per EMR, new medications updated. Patient Active Problem List   Diagnosis Date Noted   Anxiety 06/24/2021   Depressive disorder 06/24/2021   Gastroesophageal reflux disease    Globus sensation    Belching    Lipoma of scalp 05/28/2018   Past Medical History:  Diagnosis Date   Arthritis    osteo arthritis   Carpal tunnel syndrome    Depression    Fatty liver  Fibromyalgia    Fibromyalgia    GERD (gastroesophageal reflux disease)    Low back pain    Osteoarthritis    Osteoarthritis    Pain in joint    Polyarthralgia    PONV (postoperative nausea and vomiting)    Family History  Problem Relation Age of  Onset   Breast cancer Sister 53   Breast cancer Paternal Aunt 31   Lung cancer Mother    Heart disease Father    Heart failure Maternal Grandmother    Lung cancer Maternal Grandfather    Pancreatic cancer Paternal Grandmother    Colon cancer Neg Hx    Esophageal cancer Neg Hx    Rectal cancer Neg Hx    Stomach cancer Neg Hx    Past Surgical History:  Procedure Laterality Date   77 HOUR PH STUDY N/A 12/14/2019   Procedure: 24 HOUR PH STUDY;  Surgeon: Shila Gustav GAILS, MD;  Location: [facility];  Service: Endoscopy;  Laterality: N/A;   BUNIONECTOMY  2004   CARPAL TUNNEL RELEASE Left 06/15/2015   Procedure: LEFT CARPAL TUNNEL RELEASE;  Surgeon: Elsie Mussel, MD;  Location: [facility];  Service: Orthopedics;  Laterality: Left;   CARPAL TUNNEL RELEASE Right 08/31/2015   Procedure: RIGHT LIMITED OPEN CARPAL TUNNEL RELEASE;  Surgeon: Elsie Mussel, MD;  Location: [facility];  Service: Orthopedics;  Laterality: Right;   ESOPHAGEAL MANOMETRY N/A 12/14/2019   Procedure: ESOPHAGEAL MANOMETRY (EM);  Surgeon: Shila Gustav GAILS, MD;  Location: [facility];  Service: Endoscopy;  Laterality: N/A;  DR. EDA   LIPOMA EXCISION Right 05/28/2018   Procedure: EXCISION LIPOMA OF SCALP;  Surgeon: Mable Lenis, MD;  Location: [facility];  Service: ENT;  Laterality: Right;   SHOULDER SURGERY  2010, 2008   TARSAL TUNNEL RELEASE Left 2012   TUBAL LIGATION  1985   Social History   Occupational History   Not on file  Tobacco Use   Smoking status: Former    Types: Cigarettes   Smokeless tobacco: Never   Tobacco comments:    Quit 35 years ago  Vaping Use   Vaping status: Never Used  Substance and Sexual Activity   Alcohol use: No   Drug use: No   Sexual activity: Yes    Birth control/protection: Post-menopausal

## 2024-08-23 ENCOUNTER — Encounter (HOSPITAL_BASED_OUTPATIENT_CLINIC_OR_DEPARTMENT_OTHER): Payer: Self-pay | Admitting: Physical Therapy

## 2024-08-23 ENCOUNTER — Ambulatory Visit (HOSPITAL_BASED_OUTPATIENT_CLINIC_OR_DEPARTMENT_OTHER): Payer: Self-pay | Attending: Orthopaedic Surgery | Admitting: Physical Therapy

## 2024-08-23 DIAGNOSIS — M5459 Other low back pain: Secondary | ICD-10-CM | POA: Insufficient documentation

## 2024-08-23 DIAGNOSIS — R293 Abnormal posture: Secondary | ICD-10-CM | POA: Diagnosis not present

## 2024-08-23 NOTE — Therapy (Signed)
 OUTPATIENT PHYSICAL THERAPY EVALUATION   Patient Name: Priscilla Underwood MRN: [id number] DOB:02/05/1961, 63 y.o., female Today's Date: 08/23/2024  END OF SESSION:  PT End of Session - 08/23/24 0924     Visit Number 5    Number of Visits 30    Date for PT Re-Evaluation 11/15/24    Authorization Type [employer]    PT Start Time 0930    PT Stop Time 1015    PT Time Calculation (min) 45 min    Activity Tolerance Patient tolerated treatment well    Behavior During Therapy WFL for tasks assessed/performed          Past Medical History:  Diagnosis Date   Arthritis    osteo arthritis   Carpal tunnel syndrome    Depression    Fatty liver    Fibromyalgia    Fibromyalgia    GERD (gastroesophageal reflux disease)    Low back pain    Osteoarthritis    Osteoarthritis    Pain in joint    Polyarthralgia    PONV (postoperative nausea and vomiting)    Past Surgical History:  Procedure Laterality Date   50 HOUR PH STUDY N/A 12/14/2019   Procedure: 24 HOUR PH STUDY;  Surgeon: Shila Gustav GAILS, MD;  Location: [facility];  Service: Endoscopy;  Laterality: N/A;   BUNIONECTOMY  2004   CARPAL TUNNEL RELEASE Left 06/15/2015   Procedure: LEFT CARPAL TUNNEL RELEASE;  Surgeon: Elsie Mussel, MD;  Location: [facility];  Service: Orthopedics;  Laterality: Left;   CARPAL TUNNEL RELEASE Right 08/31/2015   Procedure: RIGHT LIMITED OPEN CARPAL TUNNEL RELEASE;  Surgeon: Elsie Mussel, MD;  Location: [facility];  Service: Orthopedics;  Laterality: Right;   ESOPHAGEAL MANOMETRY N/A 12/14/2019   Procedure: ESOPHAGEAL MANOMETRY (EM);  Surgeon: Shila Gustav GAILS, MD;  Location: [facility];  Service: Endoscopy;  Laterality: N/A;  DR. EDA   LIPOMA EXCISION Right 05/28/2018   Procedure: EXCISION LIPOMA OF SCALP;  Surgeon: Mable Lenis, MD;  Location: [facility];  Service: ENT;  Laterality: Right;   SHOULDER SURGERY  2010, 2008   TARSAL TUNNEL RELEASE Left  2012   TUBAL LIGATION  1985   Patient Active Problem List   Diagnosis Date Noted   Anxiety 06/24/2021   Depressive disorder 06/24/2021   Gastroesophageal reflux disease    Globus sensation    Belching    Lipoma of scalp 05/28/2018     REFERRING PROVIDER:  Genelle Standing, MD     REFERRING DIAG:  M53.3,G89.29 (ICD-10-CM) - Chronic right SI joint pain      Rationale for Evaluation and Treatment: Rehabilitation  THERAPY DIAG:  Other low back pain  Abnormal posture  ONSET DATE: in the last 2 weeks   SUBJECTIVE:  SUBJECTIVE STATEMENT: Both sides of buttocks ache all the time. It seems to all be stemming from the Rt SIJ. Hamstrings are getting really tight even though I am doing my stretches. Denies any change from injection.   PERTINENT HISTORY:  Chronic buttock pain  PAIN:  Are you having pain? Yes: NPRS scale: 2-3 seated short periods Pain location: SIJ Pain description: twinge, sore Aggravating factors: driving Relieving factors: stand, stretch  PRECAUTIONS:  None  RED FLAGS: None   WEIGHT BEARING RESTRICTIONS:  No  FALLS:  Has patient fallen in last 6 months? No  OCCUPATION:  Retired but goes to [employer] often to [employer] granddaughter  PLOF:  Independent  PATIENT GOALS:  Decrease pain  OBJECTIVE:  Note: Objective measures were completed at Evaluation unless otherwise noted.  DIAGNOSTIC FINDINGS:  Past MRI in March 2025 Dr Genelle: small gluteus medius undersurface tear on Rt side  PATIENT SURVEYS:  LEFS  Extreme difficulty/unable (0), Quite a bit of difficulty (1), Moderate difficulty (2), Little difficulty (3), No difficulty (4) Survey date:    Any of your usual work, housework or school activities 2  2. Usual hobbies, recreational or sporting activities 3   3. Getting into/out of the bath 4  4. Walking between rooms 4  5. Putting on socks/shoes 3  6. Squatting  2  7. Lifting an object, like a bag of groceries from the floor 2  8. Performing light activities around your home 3  9. Performing heavy activities around your home 2  10. Getting into/out of a car 2  11. Walking 2 blocks 4  12. Walking 1 mile 2  13. Going up/down 10 stairs (1 flight) 2  14. Standing for 1 hour 2  15.  sitting for 1 hour 0  16. Running on even ground 0  17. Running on uneven ground 0  18. Making sharp turns while running fast 0  19. Hopping  0  20. Rolling over in bed 4  Score total:  41/80     POSTURE:   Eval: standing Rt SIJ elevation, Rt ASIS elevated vs Lt, Lt knee extending past 0 with distal femur ER  Supine: LLD- Rt shorter  GAIT:  Comments: EVAL- Rt trendelenburg   Body Part #1 Hip  PALPATION: EVAL: Limited Rt SIJ mobility  MMT (lb via HHD) Rt/Lt 8/13   Hip flexion 25.3 p!/23.6   Hip extension- qped knee ext 23.1/24.0   Hip abduction 26.1/26.7   Hip adduction    Hip internal rotation    Hip external rotation    Knee flexion 18.5 p!/20   Knee extension 45/43.5    (Blank rows = not tested)                                                                                                                               TREATMENT DATE:  Treatment  9/16:  Standing: Lt innom ant rotation, equal SIJ motion in forward flexion  Supine legs on wall- abdominal engagement with reduced glut activation/post pelvic tilt.  Qped feet on walls, on elbows, ab set without glut/post pelvic tilt Standing ab set without post pelvic tilt Rt sidelying rib lift + Lt knee lift from bolster Sit<>stand & dead lift/hip hinge with ab set & lengthening through glutes   Treatment                            8/13: Blank lines following charge title = not provided on this treatment date.   Manual:  TPDN YES Trigger Point Dry  Needling  Subsequent Treatment: Instructions provided previously at initial dry needling treatment.   Patient Verbal Consent Given: Yes Education Handout Provided: Previously Provided Muscles Treated: bilateral lumbar paraspinals L 3, 4, 5; Lt biceps femoris in distal 1/3 Electrical Stimulation Performed: No Treatment Response/Outcome: twitch with decreased muscular tension PA central and Rt-sided mobs L3, 4, 5 There-ex: Prone upper body extension with short hold for lumbar stability.  Discussed lumbopelvic program to be continued at home & hold on correction motion.  There-Act:  Self Care:  Nuro-Re-ed:  Gait Training:     PATIENT EDUCATION:  Education details: Teacher, music of condition, POC, HEP, exercise form/rationale Person educated: Patient Education method: Explanation, Demonstration, Tactile cues, Verbal cues, and Handouts Education comprehension: verbalized understanding, returned demonstration, verbal cues required, tactile cues required, and needs further education  HOME EXERCISE PROGRAM: Ab set with iso add Rt overhead reach with breathing   ASSESSMENT:  CLINICAL IMPRESSION: Notable inability to contract core without glut clench and posterior pelvic tilt. Worked today on coordinated contraction to decrease pressure into SIJs and provide stretch to deep hip external rotators. Lacking Lt glut activation vs right in prone and feels that the right glut is more dominant in standing activities. Pt will work to incorporate proper core and decreased glut clench and will extend POC to continue addressing movement dysfunctions.     REHAB POTENTIAL: Good  CLINICAL DECISION MAKING: Stable/uncomplicated  EVALUATION COMPLEXITY: Low   GOALS: Goals reviewed with patient? Yes  SHORT TERM GOALS: Target date: 07/30/24  Independent with HEP as it has been established in the short term for piriformis stretching without increasing SIJ pain Baseline: Goal status: ongoing     LONG TERM GOALS: Target date: POC date  LEFS to improve by MDC Baseline:  Goal status: INITIAL  2.  Squat/lift grandchild without increase in pain Baseline:  Goal status: INITIAL  3.  Tolerate driving in the car for at least 30 min without increase in pain Baseline:  Goal status: INITIAL     PLAN:  PT FREQUENCY: 1-2x/week  PT DURATION: POC date  PLANNED INTERVENTIONS: 97164- PT Re-evaluation, 97750- Physical Performance Testing, 97110-Therapeutic exercises, 97530- Therapeutic activity, [id number]- Neuromuscular re-education, 97535- Self Care, [id number]- Manual therapy, [id number]- Gait training, [phone]- Aquatic Therapy, [phone] (1-2 muscles), [id number] (3+ muscles)- Dry Needling, Patient/Family education, Balance training, Stair training, Taping, Joint mobilization, Spinal mobilization, and Cryotherapy.  PLAN FOR NEXT SESSION: recheck alignment   Harlene Cordon, PT, DPT 08/23/2024, 1:59 PM

## 2024-08-24 ENCOUNTER — Ambulatory Visit (HOSPITAL_BASED_OUTPATIENT_CLINIC_OR_DEPARTMENT_OTHER): Admitting: Orthopaedic Surgery

## 2024-08-24 DIAGNOSIS — M533 Sacrococcygeal disorders, not elsewhere classified: Secondary | ICD-10-CM

## 2024-08-24 DIAGNOSIS — G8929 Other chronic pain: Secondary | ICD-10-CM

## 2024-08-24 NOTE — Progress Notes (Signed)
 Chief Complaint: Right hip pain        History of Present Illness:    08/24/2024: Presents today for follow-up of her right hip.  Unfortunately she did not get any relief from her injection of the piriformis. Surgical History:   None   PMH/PSH/Family History/Social History/Meds/Allergies:         Past Medical History:  Diagnosis Date   Arthritis      osteo arthritis   Depression     Fatty liver     Fibromyalgia     GERD (gastroesophageal reflux disease)     PONV (postoperative nausea and vomiting)               Past Surgical History:  Procedure Laterality Date   10 HOUR PH STUDY N/A 12/14/2019    Procedure: 24 HOUR PH STUDY;  Surgeon: Shila Gustav GAILS, MD;  Location: [facility];  Service: Endoscopy;  Laterality: N/A;   BUNIONECTOMY   2004   CARPAL TUNNEL RELEASE Left 06/15/2015    Procedure: LEFT CARPAL TUNNEL RELEASE;  Surgeon: Elsie Mussel, MD;  Location: [facility];  Service: Orthopedics;  Laterality: Left;   CARPAL TUNNEL RELEASE Right 08/31/2015    Procedure: RIGHT LIMITED OPEN CARPAL TUNNEL RELEASE;  Surgeon: Elsie Mussel, MD;  Location: [facility];  Service: Orthopedics;  Laterality: Right;   ESOPHAGEAL MANOMETRY N/A 12/14/2019    Procedure: ESOPHAGEAL MANOMETRY (EM);  Surgeon: Shila Gustav GAILS, MD;  Location: [facility];  Service: Endoscopy;  Laterality: N/A;  DR. EDA   LIPOMA EXCISION Right 05/28/2018    Procedure: EXCISION LIPOMA OF SCALP;  Surgeon: Mable Lenis, MD;  Location: [facility];  Service: ENT;  Laterality: Right;   SHOULDER SURGERY   2010, 2008   TARSAL TUNNEL RELEASE Left 2012   TUBAL LIGATION   1985        Social History         Socioeconomic History   Marital status: Married      Spouse name: Not on file   Number of children: Not on file   Years of education: Not on file   Highest education level: Not on file  Occupational History   Not on file   Tobacco Use   Smoking status: Former      Types: Cigarettes   Smokeless tobacco: Never   Tobacco comments:      Quit 35 years ago  Vaping Use   Vaping status: Never Used  Substance and Sexual Activity   Alcohol use: No   Drug use: No   Sexual activity: Yes      Birth control/protection: Post-menopausal  Other Topics Concern   Not on file  Social History Narrative   Not on file    Social Drivers of Health        Financial Resource Strain: Low Risk  (11/20/2023)    Received from [facility]    Overall Financial Resource Strain (CARDIA)     Difficulty of Paying Living Expenses: Not hard at all  Food Insecurity: No Food Insecurity (11/20/2023)    Received [facility]    Hunger Vital Sign     Worried About Running Out of Food in the Last Year: Never true     Ran Out of Food in the Last  Year: Never true  Transportation Needs: No Transportation Needs (11/20/2023)    Received from [facility] - Transportation     Lack of Transportation (Medical): No     Lack of Transportation (Non-Medical): No  Physical Activity: Sufficiently Active (11/20/2023)    Received from [facility]    Exercise Vital Sign     Days of Exercise per Week: 3 days     Minutes of Exercise per Session: 60 min  Stress: No Stress Concern Present (11/20/2023)    Received from [facility] - Occupational Stress Questionnaire     Feeling of Stress : Not at all  Social Connections: Socially Integrated (11/20/2023)    Received from [facility]    Social Network     How would you rate your social network (family, work, friends)?: Good participation with social networks         Family History  Problem Relation Age of Onset   Breast cancer Sister 24   Breast cancer Paternal Aunt 61   Lung cancer Mother     Heart disease Father     Heart failure Maternal Grandmother     Lung cancer Maternal Grandfather     Pancreatic cancer Paternal  Grandmother     Colon cancer Neg Hx     Esophageal cancer Neg Hx     Rectal cancer Neg Hx     Stomach cancer Neg Hx          Allergies      Allergies  Allergen Reactions   Ciprofloxacin Rash   Avelox [Moxifloxacin Hcl In Nacl] Rash            Current Outpatient Medications  Medication Sig Dispense Refill   albuterol  (VENTOLIN  HFA) 108 (90 Base) MCG/ACT inhaler Inhale 2 puffs into the lungs every 6 hours as needed 8.5 g 0   amoxicillin -clavulanate (AUGMENTIN ) 875-125 MG tablet Take 1 tablet by mouth every 12 (twelve) hours for 10 days. 20 tablet 0   Azelastine  HCl (ASTEPRO ) 0.15 % SOLN Use 2 sprays in each nostril once daily. 30 mL 1   Azelastine  HCl (ASTEPRO ) 0.15 % SOLN Use 2 sprays in each nostril once a day. 30 mL 1   azithromycin  (ZITHROMAX ) 250 MG tablet Take 2 tablets by mouth on the first day, then take 1 tablet daily for 4 days. 6 tablet 0   buPROPion  (WELLBUTRIN  XL) 150 MG 24 hr tablet TAKE 1 TABLET BY MOUTH IN THE MORNING 90 tablet 0   buPROPion  (WELLBUTRIN  XL) 150 MG 24 hr tablet Take 1 tablet by mouth once daily in the morning 90 tablet 1   buPROPion  (WELLBUTRIN  XL) 150 MG 24 hr tablet Take 1 tablet by mouth once daily in the morning 90 tablet 2   buPROPion  (WELLBUTRIN  XL) 150 MG 24 hr tablet Take 1 tablet (150 mg) by mouth every morning. 30 tablet 1   desvenlafaxine  (PRISTIQ ) 100 MG 24 hr tablet desvenlafaxine  ER 100 mg tablet,extended release 24 hr  Take 1 tablet every day by oral route.       desvenlafaxine  (PRISTIQ ) 100 MG 24 hr tablet Take 1 tablet (100 mg total) by mouth daily. 90 tablet 1   desvenlafaxine  (PRISTIQ ) 50 MG 24 hr tablet Take 1 tablet (50 mg total) by mouth daily. 30 tablet 2   desvenlafaxine  (PRISTIQ ) 50 MG 24 hr tablet Take 1 tablet (50 mg total) by mouth daily. 90 tablet 2  diclofenac  (VOLTAREN ) 75 MG EC tablet Take 1 tablet (75 mg total) by mouth 2 (two) times daily as needed. 60 tablet 3   diclofenac  (VOLTAREN ) 75 MG EC tablet Take 1 tablet (75  mg) by mouth daily for 90 days. 90 tablet 1   doxycycline  (ADOXA) 100 MG tablet Take 1 tablet twice daily for 10 days. 20 tablet 0   esomeprazole  (NEXIUM ) 40 MG capsule Take 40 mg by mouth daily at 12 noon.       famotidine (PEPCID) 10 MG tablet Take 10 mg by mouth daily.       gabapentin  (NEURONTIN ) 100 MG capsule Take 3 capsules (300 mg total) by mouth daily as directed 90 capsule 3   methocarbamol  (ROBAXIN ) 500 MG tablet Take 500 mg by mouth every 8 (eight) hours as needed for muscle spasms.       methocarbamol  (ROBAXIN ) 500 MG tablet Take 1 tablet (500 mg total) by mouth 3 (three) times daily for 30 days. 90 tablet 3   metoprolol  tartrate (LOPRESSOR ) 25 MG tablet TAKE 1 TABLET 2 HOURS PRIOR TO CT 1 tablet 0   mupirocin  ointment (BACTROBAN ) 2 % Apply to affected area 2 times a day for 14 days 22 g 0   pantoprazole  (PROTONIX ) 40 MG tablet Take 1 tablet (40 mg total) by mouth 2 (two) times daily. 180 tablet 0   promethazine -dextromethorphan (PROMETHAZINE -DM) 6.25-15 MG/5ML syrup Take 5 mL by mouth every 6 hours as needed for 10 days 200 mL 0   promethazine -dextromethorphan (PROMETHAZINE -DM) 6.25-15 MG/5ML syrup Take 5mls every 6 hours as needed for 5 days. 100 mL 0   sertraline (ZOLOFT) 100 MG tablet Take 100 mg by mouth.    5   sucralfate (CARAFATE) 1 g tablet Take 1 g by mouth 4 (four) times daily -  with meals and at bedtime.       sulfamethoxazole -trimethoprim  (BACTRIM  DS) 800-160 MG tablet Take 1 tablet by mouth 2 times a day for 14 days 28 tablet 0   terbinafine  (LAMISIL ) 250 MG tablet Please take one a day x 7days, repeat every 4 weeks x 4 months 28 tablet 0   terbinafine  (LAMISIL ) 250 MG tablet Take 1 tablet (250 mg total) by mouth daily. 30 tablet 2   traMADol  (ULTRAM ) 50 MG tablet Take 1 tablet  by mouth once a day as needed (30 days) 15 tablet 0   valACYclovir  (VALTREX ) 1000 MG tablet as needed.    2   valACYclovir  (VALTREX ) 500 MG tablet Take 1 tablet (500 mg) by mouth daily. 30  tablet 1   zolpidem  (AMBIEN ) 5 MG tablet Take 5 mg by mouth at bedtime as needed for sleep.    1   zolpidem  (AMBIEN ) 5 MG tablet Take 1 tablet (5 mg total) by mouth at bedtime as needed (please try NOT to take every evening) 30 tablet 3      No current facility-administered medications for this visit.      Imaging Results (Last 48 hours)  No results found.     Review of Systems:   A ROS was performed including pertinent positives and negatives as documented in the HPI.   Physical Exam :   Constitutional: NAD and appears stated age Neurological: Alert and oriented Psych: Appropriate affect and cooperative There were no vitals taken for this visit.    Comprehensive Musculoskeletal Exam:     Right hip with tenderness about the posterior buttocks as well as the ischial tuberosity and SI joint.  There  is pain with positive FADIR.  3 degrees internal/external rotation of the right hip   Imaging:    Right hip MRI: Small undersurface tear of the gluteus medius minimus junction   I personally reviewed and interpreted the radiographs.     Assessment:   63 y.o. female here for follow-up of her right hip.  At this time I did discuss that we have injected just about every possible site of pain without any relief.  Unfortunately I do believe that this pain may be chronic in nature.  We did just briefly discuss the role for SI joint fusion although I did discuss that the outcomes are somewhat unreliable.  Given this ultimately I believe it would be in her best interest to continue to work in physical therapy as well as a referral to Dr. Lorilee for discussion of nonnarcotic pain management as this is her ultimate goal.  Lastly I did briefly counsel and discuss with her the possibility of pain reprocessing therapy as well.  I will plan to see her back as needed should she ever want to discuss further intervention Plan :     - Return to clinic as needed         I personally saw and evaluated  the patient, and participated in the management and treatment plan.

## 2024-09-01 ENCOUNTER — Ambulatory Visit (HOSPITAL_COMMUNITY)
Admission: RE | Admit: 2024-09-01 | Discharge: 2024-09-01 | Disposition: A | Discharge status: Home / Self Care | Source: Ambulatory Visit | Attending: Cardiology | Admitting: Cardiology

## 2024-09-01 DIAGNOSIS — R079 Chest pain, unspecified: Secondary | ICD-10-CM | POA: Insufficient documentation

## 2024-09-01 DIAGNOSIS — I4891 Unspecified atrial fibrillation: Secondary | ICD-10-CM | POA: Insufficient documentation

## 2024-09-01 LAB — ECHOCARDIOGRAM COMPLETE
Area-P 1/2: 2.69 cm2
MV M vel: 3.88 m/s
MV Peak grad: 60.2 mmHg
S' Lateral: 3.3 cm

## 2024-09-02 ENCOUNTER — Ambulatory Visit: Payer: Self-pay | Admitting: Cardiology

## 2024-09-06 ENCOUNTER — Encounter: Payer: Self-pay | Admitting: Cardiology

## 2024-09-06 ENCOUNTER — Encounter (HOSPITAL_BASED_OUTPATIENT_CLINIC_OR_DEPARTMENT_OTHER): Admitting: Physical Therapy

## 2024-09-06 ENCOUNTER — Encounter (HOSPITAL_BASED_OUTPATIENT_CLINIC_OR_DEPARTMENT_OTHER): Payer: Self-pay | Admitting: Physical Therapy

## 2024-09-07 ENCOUNTER — Encounter (HOSPITAL_BASED_OUTPATIENT_CLINIC_OR_DEPARTMENT_OTHER): Payer: Self-pay | Admitting: Physical Therapy

## 2024-09-08 ENCOUNTER — Telehealth: Payer: Self-pay | Admitting: Cardiology

## 2024-09-08 NOTE — Telephone Encounter (Signed)
  Per MyChart scheduling message:  I would like to be contacted either by the Doctor or the nurse to go over the monitor results once he has looked them over.

## 2024-09-12 ENCOUNTER — Encounter (HOSPITAL_BASED_OUTPATIENT_CLINIC_OR_DEPARTMENT_OTHER): Payer: Self-pay | Admitting: Physical Therapy

## 2024-09-12 ENCOUNTER — Ambulatory Visit (HOSPITAL_BASED_OUTPATIENT_CLINIC_OR_DEPARTMENT_OTHER): Attending: Orthopaedic Surgery | Admitting: Physical Therapy

## 2024-09-12 DIAGNOSIS — R293 Abnormal posture: Secondary | ICD-10-CM | POA: Diagnosis not present

## 2024-09-12 DIAGNOSIS — M5459 Other low back pain: Secondary | ICD-10-CM | POA: Insufficient documentation

## 2024-09-12 NOTE — Therapy (Unsigned)
 OUTPATIENT PHYSICAL THERAPY EVALUATION   Patient Name: Priscilla Underwood MRN: [id number] DOB:1961-09-06, 63 y.o., female Today's Date: 09/13/2024  END OF SESSION:  PT End of Session - 09/12/24 1406     Visit Number 6    Number of Visits 30    Date for Recertification  11/15/24    Authorization Type BCBS    PT Start Time 1405    PT Stop Time 1431    PT Time Calculation (min) 26 min    Activity Tolerance Patient tolerated treatment well    Behavior During Therapy WFL for tasks assessed/performed          Past Medical History:  Diagnosis Date   Arthritis    osteo arthritis   Carpal tunnel syndrome    Depression    Fatty liver    Fibromyalgia    Fibromyalgia    GERD (gastroesophageal reflux disease)    Low back pain    Osteoarthritis    Osteoarthritis    Pain in joint    Polyarthralgia    PONV (postoperative nausea and vomiting)    Past Surgical History:  Procedure Laterality Date   65 HOUR PH STUDY N/A 12/14/2019   Procedure: 24 HOUR PH STUDY;  Surgeon: Shila Gustav GAILS, MD;  Location: [facility];  Service: Endoscopy;  Laterality: N/A;   BUNIONECTOMY  2004   CARPAL TUNNEL RELEASE Left 06/15/2015   Procedure: LEFT CARPAL TUNNEL RELEASE;  Surgeon: Elsie Mussel, MD;  Location: [facility];  Service: Orthopedics;  Laterality: Left;   CARPAL TUNNEL RELEASE Right 08/31/2015   Procedure: RIGHT LIMITED OPEN CARPAL TUNNEL RELEASE;  Surgeon: Elsie Mussel, MD;  Location: [facility];  Service: Orthopedics;  Laterality: Right;   ESOPHAGEAL MANOMETRY N/A 12/14/2019   Procedure: ESOPHAGEAL MANOMETRY (EM);  Surgeon: Shila Gustav GAILS, MD;  Location: [facility];  Service: Endoscopy;  Laterality: N/A;  DR. EDA   LIPOMA EXCISION Right 05/28/2018   Procedure: EXCISION LIPOMA OF SCALP;  Surgeon: Mable Lenis, MD;  Location: [facility];  Service: ENT;  Laterality: Right;   SHOULDER SURGERY  2010, 2008   TARSAL TUNNEL RELEASE Left  2012   TUBAL LIGATION  1985   Patient Active Problem List   Diagnosis Date Noted   Anxiety 06/24/2021   Depressive disorder 06/24/2021   Gastroesophageal reflux disease    Globus sensation    Belching    Lipoma of scalp 05/28/2018     REFERRING PROVIDER:  Genelle Standing, MD     REFERRING DIAG:  M53.3,G89.29 (ICD-10-CM) - Chronic right SI joint pain      Rationale for Evaluation and Treatment: Rehabilitation  THERAPY DIAG:  Other low back pain  Abnormal posture  ONSET DATE: in the last 2 weeks   SUBJECTIVE:  SUBJECTIVE STATEMENT: I had a fibromyalgia flare, did go to the gym just to stretch but did not do exercises. Granddaughter got married this weekend and was on my feet all day in wedge heels. I was on the couch pretty much all day yesterday.   PERTINENT HISTORY:  Chronic buttock pain  PAIN:  Are you having pain? Yes: NPRS scale: 2-3 seated short periods Pain location: SIJ Pain description: twinge, sore Aggravating factors: driving Relieving factors: stand, stretch  PRECAUTIONS:  None  RED FLAGS: None   WEIGHT BEARING RESTRICTIONS:  No  FALLS:  Has patient fallen in last 6 months? No  OCCUPATION:  Retired but goes to [employer] often to [employer] granddaughter  PLOF:  Independent  PATIENT GOALS:  Decrease pain  OBJECTIVE:  Note: Objective measures were completed at Evaluation unless otherwise noted.  DIAGNOSTIC FINDINGS:  Past MRI in March 2025 Dr Genelle: small gluteus medius undersurface tear on Rt side  PATIENT SURVEYS:  LEFS  Extreme difficulty/unable (0), Quite a bit of difficulty (1), Moderate difficulty (2), Little difficulty (3), No difficulty (4) Survey date:    Any of your usual work, housework or school activities 2  2. Usual hobbies,  recreational or sporting activities 3  3. Getting into/out of the bath 4  4. Walking between rooms 4  5. Putting on socks/shoes 3  6. Squatting  2  7. Lifting an object, like a bag of groceries from the floor 2  8. Performing light activities around your home 3  9. Performing heavy activities around your home 2  10. Getting into/out of a car 2  11. Walking 2 blocks 4  12. Walking 1 mile 2  13. Going up/down 10 stairs (1 flight) 2  14. Standing for 1 hour 2  15.  sitting for 1 hour 0  16. Running on even ground 0  17. Running on uneven ground 0  18. Making sharp turns while running fast 0  19. Hopping  0  20. Rolling over in bed 4  Score total:  41/80     POSTURE:   Eval: standing Rt SIJ elevation, Rt ASIS elevated vs Lt, Lt knee extending past 0 with distal femur ER  Supine: LLD- Rt shorter  GAIT:  Comments: EVAL- Rt trendelenburg   Body Part #1 Hip  PALPATION: EVAL: Limited Rt SIJ mobility  MMT (lb via HHD) Rt/Lt 8/13   Hip flexion 25.3 p!/23.6   Hip extension- qped knee ext 23.1/24.0   Hip abduction 26.1/26.7   Hip adduction    Hip internal rotation    Hip external rotation    Knee flexion 18.5 p!/20   Knee extension 45/43.5    (Blank rows = not tested)                                                                                                                               TREATMENT DATE:  10/6: Exercises  (all to be  done from stable bar) - Squat   - Single Leg Hip Hinge  - Assisted Lunge  - Hip Hinge  - Lateral Lunge  - Hip Swing  Treatment                            9/16:  Standing: Lt innom ant rotation, equal SIJ motion in forward flexion  Supine legs on wall- abdominal engagement with reduced glut activation/post pelvic tilt.  Qped feet on walls, on elbows, ab set without glut/post pelvic tilt Standing ab set without post pelvic tilt Rt sidelying rib lift + Lt knee lift from bolster Sit<>stand & dead lift/hip hinge with ab set &  lengthening through glutes   Treatment                            8/13: Blank lines following charge title = not provided on this treatment date.   Manual:  TPDN YES Trigger Point Dry Needling  Subsequent Treatment: Instructions provided previously at initial dry needling treatment.   Patient Verbal Consent Given: Yes Education Handout Provided: Previously Provided Muscles Treated: bilateral lumbar paraspinals L 3, 4, 5; Lt biceps femoris in distal 1/3 Electrical Stimulation Performed: No Treatment Response/Outcome: twitch with decreased muscular tension PA central and Rt-sided mobs L3, 4, 5 There-ex: Prone upper body extension with short hold for lumbar stability.  Discussed lumbopelvic program to be continued at home & hold on correction motion.  There-Act:  Self Care:  Nuro-Re-ed:  Gait Training:     PATIENT EDUCATION:  Education details: Anatomy of condition, POC, HEP, exercise form/rationale Person educated: Patient Education method: Explanation, Demonstration, Tactile cues, Verbal cues, and Handouts Education comprehension: verbalized understanding, returned demonstration, verbal cues required, tactile cues required, and needs further education  HOME EXERCISE PROGRAM: Ab set with iso add Rt overhead reach with breathing Access Code: [email or web address] URL: [email or web address] Date: 09/12/2024 Prepared by: Harlene Cordon  Exercises  (all to be done from stable bar) - Squat with TRX  - 1 x daily - 7 x weekly - 3 sets - 10 reps - Single Leg Hip Hinge with TRX  - 1 x daily - 7 x weekly - 3 sets - 10 reps - Assisted Lunge with TRX  - 1 x daily - 7 x weekly - 3 sets - 10 reps - Hip Hinge with TRX  - 1 x daily - 7 x weekly - 3 sets - 10 reps - Lateral Lunge with TRX  - 1 x daily - 7 x weekly - 3 sets - 10 reps - Hip Swing  - 1 x daily - 7 x weekly - 3 sets - 10 reps   ASSESSMENT:  CLINICAL IMPRESSION: Utilized stable bar to allow for large range  motions utilizing hip hinge and large muscle contract/relax. Pt reported feeling good following the session.    REHAB POTENTIAL: Good  CLINICAL DECISION MAKING: Stable/uncomplicated  EVALUATION COMPLEXITY: Low   GOALS: Goals reviewed with patient? Yes  SHORT TERM GOALS: Target date: 07/30/24  Independent with HEP as it has been established in the short term for piriformis stretching without increasing SIJ pain Baseline: Goal status: ongoing    LONG TERM GOALS: Target date: POC date  LEFS to improve by MDC Baseline:  Goal status: INITIAL  2.  Squat/lift grandchild without increase in pain Baseline:  Goal status: INITIAL  3.  Tolerate driving in the car for at  least 30 min without increase in pain Baseline:  Goal status: INITIAL     PLAN:  PT FREQUENCY: 1-2x/week  PT DURATION: POC date  PLANNED INTERVENTIONS: 97164- PT Re-evaluation, 97750- Physical Performance Testing, 97110-Therapeutic exercises, 97530- Therapeutic activity, [id number]- Neuromuscular re-education, 97535- Self Care, 02859- Manual therapy, [id number]- Gait training, [phone]- Aquatic Therapy, [phone] (1-2 muscles), [phone] (3+ muscles)- Dry Needling, Patient/Family education, Balance training, Stair training, Taping, Joint mobilization, Spinal mobilization, and Cryotherapy.  PLAN FOR NEXT SESSION: recheck alignment   Harlene Cordon, PT, DPT 09/13/2024, 3:59 PM

## 2024-09-13 ENCOUNTER — Ambulatory Visit
Admission: RE | Admit: 2024-09-13 | Discharge: 2024-09-13 | Disposition: A | Discharge status: Home / Self Care | Source: Ambulatory Visit | Attending: Obstetrics and Gynecology | Admitting: Obstetrics and Gynecology

## 2024-09-13 DIAGNOSIS — Z1231 Encounter for screening mammogram for malignant neoplasm of breast: Secondary | ICD-10-CM | POA: Diagnosis not present

## 2024-09-14 DIAGNOSIS — R0602 Shortness of breath: Secondary | ICD-10-CM

## 2024-09-14 DIAGNOSIS — I4891 Unspecified atrial fibrillation: Secondary | ICD-10-CM

## 2024-09-22 ENCOUNTER — Encounter (HOSPITAL_BASED_OUTPATIENT_CLINIC_OR_DEPARTMENT_OTHER): Payer: Self-pay | Admitting: Physical Therapy

## 2024-09-22 ENCOUNTER — Ambulatory Visit (HOSPITAL_BASED_OUTPATIENT_CLINIC_OR_DEPARTMENT_OTHER): Admitting: Physical Therapy

## 2024-09-22 DIAGNOSIS — R293 Abnormal posture: Secondary | ICD-10-CM

## 2024-09-22 DIAGNOSIS — M5459 Other low back pain: Secondary | ICD-10-CM

## 2024-09-22 NOTE — Therapy (Signed)
 OUTPATIENT PHYSICAL THERAPY EVALUATION   Patient Name: Priscilla Underwood MRN: [id number] DOB:10-Dec-1960, 63 y.o., female Today's Date: 09/22/2024  END OF SESSION:  PT End of Session - 09/22/24 1057     Visit Number 7    Number of Visits 30    Date for Recertification  11/15/24    Authorization Type BCBS    PT Start Time 1100    PT Stop Time 1141    PT Time Calculation (min) 41 min    Activity Tolerance Patient tolerated treatment well    Behavior During Therapy WFL for tasks assessed/performed          Past Medical History:  Diagnosis Date   Arthritis    osteo arthritis   Carpal tunnel syndrome    Depression    Fatty liver    Fibromyalgia    Fibromyalgia    GERD (gastroesophageal reflux disease)    Low back pain    Osteoarthritis    Osteoarthritis    Pain in joint    Polyarthralgia    PONV (postoperative nausea and vomiting)    Past Surgical History:  Procedure Laterality Date   59 HOUR PH STUDY N/A 12/14/2019   Procedure: 24 HOUR PH STUDY;  Surgeon: Shila Gustav GAILS, MD;  Location: [facility];  Service: Endoscopy;  Laterality: N/A;   BUNIONECTOMY  2004   CARPAL TUNNEL RELEASE Left 06/15/2015   Procedure: LEFT CARPAL TUNNEL RELEASE;  Surgeon: Elsie Mussel, MD;  Location: [facility];  Service: Orthopedics;  Laterality: Left;   CARPAL TUNNEL RELEASE Right 08/31/2015   Procedure: RIGHT LIMITED OPEN CARPAL TUNNEL RELEASE;  Surgeon: Elsie Mussel, MD;  Location: [facility];  Service: Orthopedics;  Laterality: Right;   ESOPHAGEAL MANOMETRY N/A 12/14/2019   Procedure: ESOPHAGEAL MANOMETRY (EM);  Surgeon: Shila Gustav GAILS, MD;  Location: [facility];  Service: Endoscopy;  Laterality: N/A;  DR. EDA   LIPOMA EXCISION Right 05/28/2018   Procedure: EXCISION LIPOMA OF SCALP;  Surgeon: Mable Lenis, MD;  Location: [facility];  Service: ENT;  Laterality: Right;   SHOULDER SURGERY  2010, 2008   TARSAL TUNNEL RELEASE  Left 2012   TUBAL LIGATION  1985   Patient Active Problem List   Diagnosis Date Noted   Anxiety 06/24/2021   Depressive disorder 06/24/2021   Gastroesophageal reflux disease    Globus sensation    Belching    Lipoma of scalp 05/28/2018     REFERRING PROVIDER:  Genelle Standing, MD     REFERRING DIAG:  M53.3,G89.29 (ICD-10-CM) - Chronic right SI joint pain      Rationale for Evaluation and Treatment: Rehabilitation  THERAPY DIAG:  Other low back pain  Abnormal posture  ONSET DATE: in the last 2 weeks   SUBJECTIVE:  SUBJECTIVE STATEMENT: Fibromyalgia flare has not come down. I have a lot of pain everywhere. Lower buttocks to mid HS bilaterally are so sore. Left side is terribly sore every time I do anything.   PERTINENT HISTORY:  Chronic buttock pain  PAIN:  Are you having pain? Yes: NPRS scale: 2-3 seated short periods Pain location: SIJ Pain description: twinge, sore Aggravating factors: driving Relieving factors: stand, stretch  PRECAUTIONS:  None  RED FLAGS: None   WEIGHT BEARING RESTRICTIONS:  No  FALLS:  Has patient fallen in last 6 months? No  OCCUPATION:  Retired but goes to [employer] often to [employer] granddaughter  PLOF:  Independent  PATIENT GOALS:  Decrease pain  OBJECTIVE:  Note: Objective measures were completed at Evaluation unless otherwise noted.  DIAGNOSTIC FINDINGS:  Past MRI in March 2025 Dr Genelle: small gluteus medius undersurface tear on Rt side  PATIENT SURVEYS:  LEFS  Extreme difficulty/unable (0), Quite a bit of difficulty (1), Moderate difficulty (2), Little difficulty (3), No difficulty (4) Survey date:    Any of your usual work, housework or school activities 2  2. Usual hobbies, recreational or sporting activities 3  3. Getting  into/out of the bath 4  4. Walking between rooms 4  5. Putting on socks/shoes 3  6. Squatting  2  7. Lifting an object, like a bag of groceries from the floor 2  8. Performing light activities around your home 3  9. Performing heavy activities around your home 2  10. Getting into/out of a car 2  11. Walking 2 blocks 4  12. Walking 1 mile 2  13. Going up/down 10 stairs (1 flight) 2  14. Standing for 1 hour 2  15.  sitting for 1 hour 0  16. Running on even ground 0  17. Running on uneven ground 0  18. Making sharp turns while running fast 0  19. Hopping  0  20. Rolling over in bed 4  Score total:  41/80     POSTURE:   Eval: standing Rt SIJ elevation, Rt ASIS elevated vs Lt, Lt knee extending past 0 with distal femur ER  Supine: LLD- Rt shorter  GAIT:  Comments: EVAL- Rt trendelenburg   Body Part #1 Hip  PALPATION: EVAL: Limited Rt SIJ mobility  MMT (lb via HHD) Rt/Lt 8/13 Rt/Lt 10/16  Hip flexion 25.3 p!/23.6 22.3/25.4  Hip extension- qped knee ext 23.1/24.0 24.2 buttock p!/23.6 HS p!  Hip abduction 26.1/26.7 27.9/27.9  Hip adduction    Hip internal rotation    Hip external rotation    Knee flexion 18.5 p!/20 34.4 buttock p!/27.9 HS p!  Knee extension 45/43.5 60.8/53.8   (Blank rows = not tested)                                                                                                                               TREATMENT DATE:  Treatment  10/16: Blank lines following charge title = not provided on this treatment date.   Manual:  TPDN No  There-ex:  There-Act: Strength measurements & discussion of ADLs, how they correlate and pain resulting Self Care:  Nuro-Re-ed: Knee on airex- ball bw knees- hip hike-iso hip ext from 90 hip flexion Qped, knees on airex, ball bw knees, feet hip width, shift back & engage glutes to propel forward into plank Gait Training:    10/6: Exercises  (all to be done from stable bar) -  Squat   - Single Leg Hip Hinge  - Assisted Lunge  - Hip Hinge  - Lateral Lunge  - Hip Swing  Treatment                            9/16:  Standing: Lt innom ant rotation, equal SIJ motion in forward flexion  Supine legs on wall- abdominal engagement with reduced glut activation/post pelvic tilt.  Qped feet on walls, on elbows, ab set without glut/post pelvic tilt Standing ab set without post pelvic tilt Rt sidelying rib lift + Lt knee lift from bolster Sit<>stand & dead lift/hip hinge with ab set & lengthening through glutes      PATIENT EDUCATION:  Education details: Anatomy of condition, POC, HEP, exercise form/rationale Person educated: Patient Education method: Explanation, Demonstration, Tactile cues, Verbal cues, and Handouts Education comprehension: verbalized understanding, returned demonstration, verbal cues required, tactile cues required, and needs further education  HOME EXERCISE PROGRAM: Ab set with iso add Rt overhead reach with breathing Access Code: [email or web address] URL: [email or web address] Date: 09/12/2024 Prepared by: Harlene Cordon  Exercises  (all to be done from stable bar) - Squat with TRX  - 1 x daily - 7 x weekly - 3 sets - 10 reps - Single Leg Hip Hinge with TRX  - 1 x daily - 7 x weekly - 3 sets - 10 reps - Assisted Lunge with TRX  - 1 x daily - 7 x weekly - 3 sets - 10 reps - Hip Hinge with TRX  - 1 x daily - 7 x weekly - 3 sets - 10 reps - Lateral Lunge with TRX  - 1 x daily - 7 x weekly - 3 sets - 10 reps - Hip Swing  - 1 x daily - 7 x weekly - 3 sets - 10 reps   ASSESSMENT:  CLINICAL IMPRESSION: All strength improved with pain noted in fewer motions. Overuse of deep hip external rotators and proximal HS continued with lack of glut activaiton. Was able to find some activation in neuro-re ed exercises today and will work on these at home. Continue with large range, active stretches and use ball between knees when bending  forward while working in the garden.     REHAB POTENTIAL: Good  CLINICAL DECISION MAKING: Stable/uncomplicated  EVALUATION COMPLEXITY: Low   GOALS: Goals reviewed with patient? Yes  SHORT TERM GOALS: Target date: 07/30/24  Independent with HEP as it has been established in the short term for piriformis stretching without increasing SIJ pain Baseline: Goal status: ongoing    LONG TERM GOALS: Target date: POC date  LEFS to improve by MDC Baseline:  Goal status: INITIAL  2.  Squat/lift grandchild without increase in pain Baseline:  Goal status: INITIAL  3.  Tolerate driving in the car for at least 30 min without increase in pain Baseline:  Goal status: INITIAL     PLAN:  PT FREQUENCY: 1-2x/week  PT DURATION: POC date  PLANNED INTERVENTIONS: 97164- PT Re-evaluation, 97750- Physical Performance Testing, 97110-Therapeutic exercises, 97530- Therapeutic activity, [id number]- Neuromuscular re-education, 97535- Self Care, 02859- Manual therapy, [phone]- Gait training, [phone]- Aquatic Therapy, [phone] (1-2 muscles), 20561 (3+ muscles)- Dry Needling, Patient/Family education, Balance training, Stair training, Taping, Joint mobilization, Spinal mobilization, and Cryotherapy.  PLAN FOR NEXT SESSION: recheck alignment   Harlene Cordon, PT, DPT 09/22/2024, 11:45 AM

## 2024-09-23 DIAGNOSIS — M2550 Pain in unspecified joint: Secondary | ICD-10-CM | POA: Diagnosis not present

## 2024-09-26 ENCOUNTER — Encounter (HOSPITAL_BASED_OUTPATIENT_CLINIC_OR_DEPARTMENT_OTHER): Payer: Self-pay

## 2024-09-26 ENCOUNTER — Encounter (HOSPITAL_BASED_OUTPATIENT_CLINIC_OR_DEPARTMENT_OTHER): Payer: Self-pay | Admitting: Physical Therapy

## 2024-09-26 ENCOUNTER — Ambulatory Visit (HOSPITAL_BASED_OUTPATIENT_CLINIC_OR_DEPARTMENT_OTHER): Admitting: Physical Therapy

## 2024-10-03 ENCOUNTER — Encounter (HOSPITAL_BASED_OUTPATIENT_CLINIC_OR_DEPARTMENT_OTHER): Admitting: Physical Therapy

## 2024-10-06 ENCOUNTER — Encounter (HOSPITAL_BASED_OUTPATIENT_CLINIC_OR_DEPARTMENT_OTHER): Admitting: Physical Therapy

## 2024-10-10 ENCOUNTER — Encounter (HOSPITAL_BASED_OUTPATIENT_CLINIC_OR_DEPARTMENT_OTHER): Admitting: Physical Therapy

## 2024-10-10 ENCOUNTER — Encounter: Payer: Self-pay | Admitting: Radiology

## 2024-10-13 ENCOUNTER — Encounter (HOSPITAL_BASED_OUTPATIENT_CLINIC_OR_DEPARTMENT_OTHER): Admitting: Physical Therapy

## 2024-10-18 ENCOUNTER — Encounter (HOSPITAL_BASED_OUTPATIENT_CLINIC_OR_DEPARTMENT_OTHER): Admitting: Physical Therapy

## 2024-10-21 ENCOUNTER — Encounter (HOSPITAL_BASED_OUTPATIENT_CLINIC_OR_DEPARTMENT_OTHER): Admitting: Physical Therapy

## 2024-10-21 DIAGNOSIS — J0110 Acute frontal sinusitis, unspecified: Secondary | ICD-10-CM | POA: Diagnosis not present

## 2024-10-22 ENCOUNTER — Encounter (HOSPITAL_BASED_OUTPATIENT_CLINIC_OR_DEPARTMENT_OTHER): Admitting: Physical Therapy

## 2024-10-25 ENCOUNTER — Encounter (HOSPITAL_BASED_OUTPATIENT_CLINIC_OR_DEPARTMENT_OTHER): Admitting: Physical Therapy

## 2024-10-27 ENCOUNTER — Encounter (HOSPITAL_BASED_OUTPATIENT_CLINIC_OR_DEPARTMENT_OTHER): Admitting: Physical Therapy

## 2024-10-28 DIAGNOSIS — M778 Other enthesopathies, not elsewhere classified: Secondary | ICD-10-CM | POA: Diagnosis not present

## 2024-10-28 DIAGNOSIS — M79672 Pain in left foot: Secondary | ICD-10-CM | POA: Diagnosis not present

## 2024-10-28 DIAGNOSIS — M19072 Primary osteoarthritis, left ankle and foot: Secondary | ICD-10-CM | POA: Diagnosis not present

## 2024-11-01 ENCOUNTER — Encounter (HOSPITAL_BASED_OUTPATIENT_CLINIC_OR_DEPARTMENT_OTHER): Admitting: Physical Therapy

## 2024-11-08 ENCOUNTER — Encounter (HOSPITAL_BASED_OUTPATIENT_CLINIC_OR_DEPARTMENT_OTHER): Admitting: Physical Therapy

## 2024-11-11 ENCOUNTER — Encounter (HOSPITAL_BASED_OUTPATIENT_CLINIC_OR_DEPARTMENT_OTHER): Admitting: Physical Therapy

## 2024-11-15 ENCOUNTER — Encounter (HOSPITAL_BASED_OUTPATIENT_CLINIC_OR_DEPARTMENT_OTHER): Admitting: Physical Therapy

## 2024-11-18 ENCOUNTER — Encounter (HOSPITAL_BASED_OUTPATIENT_CLINIC_OR_DEPARTMENT_OTHER): Admitting: Physical Therapy

## 2024-11-22 ENCOUNTER — Encounter (HOSPITAL_BASED_OUTPATIENT_CLINIC_OR_DEPARTMENT_OTHER): Admitting: Physical Therapy

## 2024-11-25 ENCOUNTER — Encounter (HOSPITAL_BASED_OUTPATIENT_CLINIC_OR_DEPARTMENT_OTHER): Admitting: Physical Therapy

## 2024-11-28 ENCOUNTER — Encounter (HOSPITAL_BASED_OUTPATIENT_CLINIC_OR_DEPARTMENT_OTHER): Admitting: Physical Therapy

## 2024-12-05 ENCOUNTER — Encounter (HOSPITAL_BASED_OUTPATIENT_CLINIC_OR_DEPARTMENT_OTHER): Admitting: Physical Therapy

## 2024-12-07 ENCOUNTER — Ambulatory Visit

## 2024-12-07 DIAGNOSIS — M8589 Other specified disorders of bone density and structure, multiple sites: Secondary | ICD-10-CM | POA: Diagnosis not present

## 2024-12-07 DIAGNOSIS — Z9189 Other specified personal risk factors, not elsewhere classified: Secondary | ICD-10-CM

## 2024-12-07 DIAGNOSIS — Z13820 Encounter for screening for osteoporosis: Secondary | ICD-10-CM

## 2024-12-07 DIAGNOSIS — Z780 Asymptomatic menopausal state: Secondary | ICD-10-CM | POA: Diagnosis not present

## 2024-12-29 ENCOUNTER — Ambulatory Visit: Admitting: Sports Medicine
